# Patient Record
Sex: Male | Born: 1980
Health system: Southern US, Community
[De-identification: ages and names within clinical notes are randomized; demographics above are authoritative.]

## PROBLEM LIST (undated history)

## (undated) DIAGNOSIS — I309 Acute pericarditis, unspecified: Secondary | ICD-10-CM

## (undated) DIAGNOSIS — K5792 Diverticulitis of intestine, part unspecified, without perforation or abscess without bleeding: Secondary | ICD-10-CM

## (undated) DIAGNOSIS — F419 Anxiety disorder, unspecified: Secondary | ICD-10-CM

## (undated) DIAGNOSIS — I2510 Atherosclerotic heart disease of native coronary artery without angina pectoris: Secondary | ICD-10-CM

## (undated) DIAGNOSIS — I10 Essential (primary) hypertension: Secondary | ICD-10-CM

## (undated) DIAGNOSIS — Z8249 Family history of ischemic heart disease and other diseases of the circulatory system: Secondary | ICD-10-CM

## (undated) HISTORY — PX: WISDOM TOOTH EXTRACTION: SHX21

## (undated) HISTORY — PX: TONSILLECTOMY: SUR1361

---
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
## 2005-01-13 ENCOUNTER — Ambulatory Visit (HOSPITAL_COMMUNITY): Admission: RE | Admit: 2005-01-13 | Discharge: 2005-01-13 | Payer: Self-pay | Admitting: Family Medicine

## 2006-02-04 ENCOUNTER — Emergency Department (HOSPITAL_COMMUNITY): Admission: EM | Admit: 2006-02-04 | Discharge: 2006-02-05 | Payer: Self-pay | Admitting: Emergency Medicine

## 2006-02-07 ENCOUNTER — Ambulatory Visit (HOSPITAL_COMMUNITY): Admission: RE | Admit: 2006-02-07 | Discharge: 2006-02-07 | Payer: Self-pay | Admitting: Family Medicine

## 2011-10-12 ENCOUNTER — Emergency Department (HOSPITAL_COMMUNITY)
Admission: EM | Admit: 2011-10-12 | Discharge: 2011-10-12 | Disposition: A | Discharge status: Home / Self Care | Payer: 59 | Attending: Emergency Medicine | Admitting: Emergency Medicine

## 2011-10-12 ENCOUNTER — Encounter: Payer: Self-pay | Admitting: *Deleted

## 2011-10-12 DIAGNOSIS — Z23 Encounter for immunization: Secondary | ICD-10-CM | POA: Insufficient documentation

## 2011-10-12 DIAGNOSIS — Z203 Contact with and (suspected) exposure to rabies: Secondary | ICD-10-CM | POA: Insufficient documentation

## 2011-10-12 NOTE — ED Notes (Signed)
Dogs exposed to rabid skunk, cleaned area where skunk had been .  Told to come here for rabies injection

## 2011-10-12 NOTE — ED Provider Notes (Signed)
Medical screening examination/treatment/procedure(s) were conducted as a shared visit with non-physician practitioner(s) and myself.  I personally evaluated the patient during the encounter He cleaned the room where a rabid skunk.  Apparently, there was saliva everywhere. He did not have gloves on.  He has a single 2 mm erythematous area on the dorsum of his right hand.  He does not know how long it has been there.   He was sent here for rabies vaccines.  I do NOT think he needs vaccination.   He has no breaks in his skin.  Exposure is not confirmed.    Nicholes Stairs, MD 10/12/11 1616

## 2011-10-12 NOTE — ED Notes (Signed)
Pt d/c to home

## 2011-10-12 NOTE — ED Notes (Signed)
Pt was told to come to er by health dept for exposure to rabies in his role of clean up afterwards.

## 2011-10-13 NOTE — ED Provider Notes (Addendum)
History     CSN: [id number]  Arrival date & time 10/12/11  1338   First MD Initiated Contact with Patient 10/12/11 1458      Chief Complaint  Patient presents with  . Rabies Injection    (Consider location/radiation/quality/duration/timing/severity/associated sxs/prior treatment) HPI Comments: Patient woke 4 days ago to find a skunk in his fiance's dog lot chasing and attempting to attack her dogs.  The animal was killed and found to be positive for rabies today.  He took are of cleaning the dog lot with bleach spray followed by using a power sprayer.  He states there was saliva everywhere since the skunk was apparently salivating copiously.  He did not wear gloves while cleaning the lot,  But did not have any obvious blood to blood or saliva to blood contact.      History reviewed. No pertinent past medical history.  History reviewed. No pertinent past surgical history.  Family History  Problem Relation Age of Onset  . Diabetes Mother   . Heart failure Mother   . Heart failure Father     History  Substance Use Topics  . Smoking status: Current Everyday Smoker  . Smokeless tobacco: Not on file  . Alcohol Use: Yes      Review of Systems  Constitutional: Negative for fever.  HENT: Negative.   Eyes: Negative.   Respiratory: Negative.   Cardiovascular: Negative.   Gastrointestinal: Negative.   Genitourinary: Negative.   Musculoskeletal: Negative.  Negative for joint swelling.  Skin: Negative.  Negative for rash and wound.  Neurological: Negative for dizziness, weakness and headaches.  Hematological: Negative.   Psychiatric/Behavioral: Negative.     Allergies  Review of patient's allergies indicates no known allergies.  Home Medications   Current Outpatient Rx  Name Route Sig Dispense Refill  . ACETAMINOPHEN 325 MG PO TABS Oral Take 325-650 mg by mouth as needed. For pain       BP 151/86  Pulse 106  Temp(Src) 97.9 F (36.6 C) (Oral)  Resp 20  Ht 6' (1.829  m)  Wt 240 lb (108.863 kg)  BMI 32.55 kg/m2  SpO2 99%  Physical Exam  Nursing note and vitals reviewed. Constitutional: He is oriented to person, place, and time. He appears well-developed and well-nourished.  HENT:  Head: Normocephalic and atraumatic.  Eyes: Conjunctivae are normal.  Neck: Neck supple.  Cardiovascular: Normal rate.   Pulmonary/Chest: Effort normal.  Musculoskeletal: Normal range of motion.  Neurological: He is alert and oriented to person, place, and time.  Skin: Skin is warm and dry.       Small,  2 mm erythematous area on mid dorsal right hand.    Psychiatric: He has a normal mood and affect.    ED Course  Procedures (including critical care time)  Labs Reviewed - No data to display No results found.   1. Contact with and suspected exposure to rabies       MDM  Discussed risk factors for rabies including blood to blood or saliva to blood contact.  Patient also seen by Dr. Weldon Inches who agrees that contact was not one requiring rabies immunoglobulin or vaccine.     Candis Musa, PA 10/13/11 2236  Patient and other family members (fiance,  And future mother in law) were offered the vaccines by Dr. Weldon Inches,  If desired,  But not medically indicated.  Ms. Doren Custard deferred,  But stated she just needed this statement in writing that the shots are not needed  so she can show this to the [facility],  As they had called patients numerous times insisting they come in here to get treated.    Candis Musa, PA 10/14/11 [phone]

## 2011-10-14 NOTE — ED Provider Notes (Signed)
Medical screening examination/treatment/procedure(s) were performed by non-physician practitioner and as supervising physician I was immediately available for consultation/collaboration.  Feliciana Narayan P Jahniya Duzan, MD 10/14/11 0705 

## 2011-10-19 NOTE — ED Provider Notes (Signed)
Medical screening examination/treatment/procedure(s) were conducted as a shared visit with non-physician practitioner(s) and myself.  I personally evaluated the patient during the encounter  Nicholes Stairs, MD 10/19/11 [phone]

## 2014-03-27 ENCOUNTER — Emergency Department (HOSPITAL_COMMUNITY): Payer: 59

## 2014-03-27 ENCOUNTER — Emergency Department (HOSPITAL_COMMUNITY)
Admission: EM | Admit: 2014-03-27 | Discharge: 2014-03-27 | Disposition: A | Discharge status: Home / Self Care | Payer: 59 | Attending: Emergency Medicine | Admitting: Emergency Medicine

## 2014-03-27 ENCOUNTER — Encounter (HOSPITAL_COMMUNITY): Payer: Self-pay | Admitting: Emergency Medicine

## 2014-03-27 DIAGNOSIS — F17200 Nicotine dependence, unspecified, uncomplicated: Secondary | ICD-10-CM | POA: Insufficient documentation

## 2014-03-27 DIAGNOSIS — R1011 Right upper quadrant pain: Secondary | ICD-10-CM | POA: Insufficient documentation

## 2014-03-27 DIAGNOSIS — R109 Unspecified abdominal pain: Secondary | ICD-10-CM

## 2014-03-27 LAB — COMPREHENSIVE METABOLIC PANEL
ALT: 41 U/L (ref 0–53)
AST: 21 U/L (ref 0–37)
Albumin: 4 g/dL (ref 3.5–5.2)
Alkaline Phosphatase: 101 U/L (ref 39–117)
BUN: 10 mg/dL (ref 6–23)
CO2: 23 mEq/L (ref 19–32)
Calcium: 9.2 mg/dL (ref 8.4–10.5)
Chloride: 100 mEq/L (ref 96–112)
Creatinine, Ser: 0.84 mg/dL (ref 0.50–1.35)
GFR calc Af Amer: >90 mL/min (ref >90)
GFR calc non Af Amer: >90 mL/min (ref >90)
Glucose, Bld: 131 mg/dL — ABNORMAL HIGH (ref 70–99)
Potassium: 3.9 mEq/L (ref 3.7–5.3)
Sodium: 139 mEq/L (ref 137–147)
Total Bilirubin: 0.2 mg/dL — ABNORMAL LOW (ref 0.3–1.2)
Total Protein: 7.3 g/dL (ref 6.0–8.3)

## 2014-03-27 LAB — CBC WITH DIFFERENTIAL/PLATELET
Basophils Absolute: 0 10*3/uL (ref 0.0–0.1)
Basophils Relative: 0 % (ref 0–1)
Eosinophils Absolute: 0.2 10*3/uL (ref 0.0–0.7)
Eosinophils Relative: 2 % (ref 0–5)
HCT: 53.2 % — ABNORMAL HIGH (ref 39.0–52.0)
Hemoglobin: 18.5 g/dL — ABNORMAL HIGH (ref 13.0–17.0)
Lymphocytes Relative: 31 % (ref 12–46)
Lymphs Abs: 2.9 10*3/uL (ref 0.7–4.0)
MCH: 31.6 pg (ref 26.0–34.0)
MCHC: 34.8 g/dL (ref 30.0–36.0)
MCV: 90.8 fL (ref 78.0–100.0)
Monocytes Absolute: 0.6 10*3/uL (ref 0.1–1.0)
Monocytes Relative: 6 % (ref 3–12)
Neutro Abs: 5.5 10*3/uL (ref 1.7–7.7)
Neutrophils Relative %: 61 % (ref 43–77)
Platelets: 264 10*3/uL (ref 150–400)
RBC: 5.86 MIL/uL — ABNORMAL HIGH (ref 4.22–5.81)
RDW: 12.9 % (ref 11.5–15.5)
WBC: 9.1 10*3/uL (ref 4.0–10.5)

## 2014-03-27 LAB — URINALYSIS, ROUTINE W REFLEX MICROSCOPIC
Bilirubin Urine: NEGATIVE
Glucose, UA: NEGATIVE mg/dL
Ketones, ur: NEGATIVE mg/dL
Leukocytes, UA: NEGATIVE
Nitrite: NEGATIVE
Protein, ur: NEGATIVE mg/dL
Specific Gravity, Urine: >1.03 — ABNORMAL HIGH (ref 1.005–1.030)
Urobilinogen, UA: 0.2 mg/dL (ref 0.0–1.0)
pH: 5.5 (ref 5.0–8.0)

## 2014-03-27 LAB — URINE MICROSCOPIC-ADD ON

## 2014-03-27 LAB — LIPASE, BLOOD: Lipase: 23 U/L (ref 11–59)

## 2014-03-27 MED ORDER — TRAMADOL HCL 50 MG PO TABS: 50.0000 mg | ORAL_TABLET | Freq: Four times a day (QID) | ORAL | Status: DC | PRN

## 2014-03-27 NOTE — ED Notes (Signed)
Pt reports to the ED with complaint of "side pain" on right. Pt has had paid for 4 weeks with increased pain for 2 days. Pt states "I thought it was a pulled muscle but now it's gotten worse."

## 2014-03-27 NOTE — Discharge Instructions (Signed)
Flank Pain °Flank pain refers to pain that is located on the side of the body between the upper abdomen and the back. The pain may occur over a short period of time (acute) or may be long-term or reoccurring (chronic). It may be mild or severe. Flank pain can be caused by many things. °CAUSES  °Some of the more common causes of flank pain include: °· Muscle strains.   °· Muscle spasms.   °· A disease of your spine (vertebral disk disease).   °· A lung infection (pneumonia).   °· Fluid around your lungs (pulmonary edema).   °· A kidney infection.   °· Kidney stones.   °· A very painful skin rash caused by the chickenpox virus (shingles).   °· Gallbladder disease.   °HOME CARE INSTRUCTIONS  °Home care will depend on the cause of your pain. In general, °· Rest as directed by your caregiver. °· Drink enough fluids to keep your urine clear or pale yellow. °· Only take over-the-counter or prescription medicines as directed by your caregiver. Some medicines may help relieve the pain. °· Tell your caregiver about any changes in your pain. °· Follow up with your caregiver as directed. °SEEK IMMEDIATE MEDICAL CARE IF:  °· Your pain is not controlled with medicine.   °· You have new or worsening symptoms. °· Your pain increases.   °· You have abdominal pain.   °· You have shortness of breath.   °· You have persistent nausea or vomiting.   °· You have swelling in your abdomen.   °· You feel faint or pass out.   °· You have blood in your urine. °· You have a fever or persistent symptoms for more than 2-3 days. °· You have a fever and your symptoms suddenly get worse. °MAKE SURE YOU:  °· Understand these instructions. °· Will watch your condition. °· Will get help right away if you are not doing well or get worse. °Document Released: 11/11/2005 Document Revised: 06/14/2012 Document Reviewed: 05/04/2012 °[employer]® Patient Information ©2015 [employer]. This information is not intended to replace advice given to you by your  health care provider. Make sure you discuss any questions you have with your health care provider. ° °

## 2014-04-02 NOTE — ED Provider Notes (Signed)
CSN: [id number]     Arrival date & time 03/27/14  1552 History   First MD Initiated Contact with Patient 03/27/14 1606     Chief Complaint  Patient presents with  . Flank Pain     (Consider location/radiation/quality/duration/timing/severity/associated sxs/prior Treatment) HPI  33 year old male with right flank pain. Ongoing for the past month. Waxes and wanes but has become more constant over the past 2-3 days. Attempted he has increased as well. Initially thought that he might have pulled a muscle. Pain sometimes worse after eating, but not consistently. Denies or vomiting. No weight loss. No positional change. No urinary complaints. No fevers or chills. No history of similar type pain. No past abdominal surgical history.  History reviewed. No pertinent past medical history. Past Surgical History  Procedure Laterality Date  . Tonsillectomy     Family History  Problem Relation Age of Onset  . Diabetes Mother   . Heart failure Mother   . Heart failure Father    History  Substance Use Topics  . Smoking status: Current Every Day Smoker -- 0.50 packs/day  . Smokeless tobacco: Not on file  . Alcohol Use: Yes    Review of Systems  All systems reviewed and negative, other than as noted in HPI.   Allergies  Bee venom  Home Medications   Prior to Admission medications   Medication Sig Start Date End Date Taking? Authorizing Provider  acetaminophen (TYLENOL) 325 MG tablet Take 325-650 mg by mouth every 6 (six) hours as needed for moderate pain. For pain   Yes Historical Provider, MD  traMADol (ULTRAM) 50 MG tablet Take 1 tablet (50 mg total) by mouth every 6 (six) hours as needed. 03/27/14   Virgel Manifold, MD   BP 127/86  Pulse 88  Temp(Src) 98.4 F (36.9 C) (Oral)  Resp 18  Ht 6' (1.829 m)  Wt 238 lb (107.956 kg)  BMI 32.27 kg/m2  SpO2 98% Physical Exam  Nursing note and vitals reviewed. Constitutional: He appears well-developed and well-nourished. No distress.   HENT:  Head: Normocephalic and atraumatic.  Eyes: Conjunctivae are normal. Right eye exhibits no discharge. Left eye exhibits no discharge.  Neck: Neck supple.  Cardiovascular: Normal rate, regular rhythm and normal heart sounds.  Exam reveals no gallop and no friction rub.   No murmur heard. Pulmonary/Chest: Effort normal and breath sounds normal. No respiratory distress.  Abdominal: Soft. He exhibits no distension. There is tenderness.  Tenderness and right flank/right upper quadrant. No rebound or guarding. No distention. No overlying skin changes.  Genitourinary:  No CVA tenderness  Musculoskeletal: He exhibits no edema and no tenderness.  Lower extremities symmetric as compared to each other. No calf tenderness. Negative Homan's. No palpable cords.   Neurological: He is alert.  Skin: Skin is warm and dry. He is not diaphoretic.  Psychiatric: He has a normal mood and affect. His behavior is normal. Thought content normal.    ED Course  Procedures (including critical care time) Labs Review Labs Reviewed  URINALYSIS, ROUTINE W REFLEX MICROSCOPIC - Abnormal; Notable for the following:    Specific Gravity, Urine >1.030 (*)    Hgb urine dipstick TRACE (*)    All other components within normal limits  COMPREHENSIVE METABOLIC PANEL - Abnormal; Notable for the following:    Glucose, Bld 131 (*)    Total Bilirubin 0.2 (*)    All other components within normal limits  CBC WITH DIFFERENTIAL - Abnormal; Notable for the following:    RBC  5.86 (*)    Hemoglobin 18.5 (*)    HCT 53.2 (*)    All other components within normal limits  LIPASE, BLOOD  URINE MICROSCOPIC-ADD ON    Imaging Review No results found.  Dg Chest 2 View  03/27/2014   CLINICAL DATA:  flank pain  EXAM: CHEST  2 VIEW  COMPARISON:  Two-view chest 02/04/2006  FINDINGS: The heart size and mediastinal contours are within normal limits. Both lungs are clear. The visualized skeletal structures are unremarkable.   IMPRESSION: No active cardiopulmonary disease.   Electronically Signed   By: Margaree Mackintosh M.D.   On: 03/27/2014 18:50   US Abdomen Limited Ruq  03/27/2014   CLINICAL DATA:  Right upper quadrant abdominal pain.  EXAM: US ABDOMEN LIMITED - RIGHT UPPER QUADRANT  COMPARISON:  None.  FINDINGS: Gallbladder:  No gallstones or wall thickening visualized. No sonographic Murphy sign noted.  Common bile duct:  Diameter: 4.1 mm  Liver:  There is diffuse increased echogenicity of the liver and. decreased through transmission consistent with fatty infiltration. No focal lesions or biliary dilatation.  IMPRESSION: 1. Normal gallbladder and normal caliber common bile duct. 2. Diffuse fatty infiltration of the liver but no focal hepatic lesions or biliary dilatation.   Electronically Signed   By: Kalman Jewels M.D.   On: 03/27/2014 18:21    EKG Interpretation None      MDM   Final diagnoses:  Right flank pain    33 year old male with pain in his right flank/lower right lateral chest. Some tenderness of the right flank and right upper quadrant. Ultrasound negative for cholelithiasis. Chest x-ray is clear. No respiratory complaints. No urinary complaints. Urinalysis not consistent with infection. No hematuria to suggest renal/ureteral calculiTriage vitals with tachycardia. Normal heart rate on my examination though and throughout the rest of his emergency room stay. Unsure of the significance of this if any. Oxygen saturations normal on room air. No increased work of breathing. No clinical evidence of DVT. I doubt hissymptoms are secondary to pulmonary embolism.Marland Kitchen Atypical for ACS. Low suspicion for emergent process. Plan symptomatic treatment at this time. Outpatient followup otherwise.    Virgel Manifold, MD 04/02/14 [phone]

## 2014-06-20 ENCOUNTER — Other Ambulatory Visit: Payer: Self-pay | Admitting: Adult Health

## 2014-06-21 ENCOUNTER — Telehealth: Payer: Self-pay | Admitting: Adult Health

## 2014-06-21 LAB — ABO AND RH: RH TYPE: POSITIVE

## 2014-06-21 NOTE — Telephone Encounter (Signed)
Left message blood type A+

## 2015-02-19 ENCOUNTER — Emergency Department (HOSPITAL_COMMUNITY): Payer: Worker's Compensation

## 2015-02-19 ENCOUNTER — Emergency Department (HOSPITAL_COMMUNITY)
Admission: EM | Admit: 2015-02-19 | Discharge: 2015-02-19 | Disposition: A | Discharge status: Home / Self Care | Payer: Worker's Compensation | Attending: Emergency Medicine | Admitting: Emergency Medicine

## 2015-02-19 ENCOUNTER — Encounter (HOSPITAL_COMMUNITY): Payer: Self-pay | Admitting: *Deleted

## 2015-02-19 DIAGNOSIS — Y9389 Activity, other specified: Secondary | ICD-10-CM | POA: Insufficient documentation

## 2015-02-19 DIAGNOSIS — S53402A Unspecified sprain of left elbow, initial encounter: Secondary | ICD-10-CM | POA: Diagnosis not present

## 2015-02-19 DIAGNOSIS — S56912A Strain of unspecified muscles, fascia and tendons at forearm level, left arm, initial encounter: Secondary | ICD-10-CM | POA: Insufficient documentation

## 2015-02-19 DIAGNOSIS — Z720 Tobacco use: Secondary | ICD-10-CM | POA: Diagnosis not present

## 2015-02-19 DIAGNOSIS — W108XXA Fall (on) (from) other stairs and steps, initial encounter: Secondary | ICD-10-CM | POA: Insufficient documentation

## 2015-02-19 DIAGNOSIS — Y998 Other external cause status: Secondary | ICD-10-CM | POA: Insufficient documentation

## 2015-02-19 DIAGNOSIS — Y9289 Other specified places as the place of occurrence of the external cause: Secondary | ICD-10-CM | POA: Insufficient documentation

## 2015-02-19 DIAGNOSIS — S59902A Unspecified injury of left elbow, initial encounter: Secondary | ICD-10-CM | POA: Diagnosis present

## 2015-02-19 MED ORDER — OXYCODONE-ACETAMINOPHEN 5-325 MG PO TABS: 1.0000 | ORAL_TABLET | ORAL | Status: DC | PRN

## 2015-02-19 MED ORDER — NAPROXEN 500 MG PO TABS: 500.0000 mg | ORAL_TABLET | Freq: Two times a day (BID) | ORAL | Status: DC

## 2015-02-19 NOTE — ED Provider Notes (Signed)
CSN: [id number]     Arrival date & time 02/19/15  0213 History  This chart was scribed for Delora Fuel, MD by Chester Holstein, ED Scribe. This patient was seen in room D33C/D33C and the patient's care was started at 3:44 AM.    Chief Complaint  Patient presents with  . Fall    The history is provided by the patient. No language interpreter was used.   HPI Comments: Juan Lopez is a 34 y.o. male who presents to the Emergency Department complaining of fall around 8:15 PM. Pt states he was at work and lost his footing walking up steps. He fell down 4-5 steps and attempted to steady himself on rail with his left arm. He states he heard a pop from left elbow. Pt notes associated left elbow pain. He reports he does a lot of heavy lifting at work. Pt denies shoulder pain and any other injury.  History reviewed. No pertinent past medical history. Past Surgical History  Procedure Laterality Date  . Tonsillectomy     Family History  Problem Relation Age of Onset  . Diabetes Mother   . Heart failure Mother   . Heart failure Father    History  Substance Use Topics  . Smoking status: Current Every Day Smoker -- 0.50 packs/day  . Smokeless tobacco: Never Used  . Alcohol Use: Yes    Review of Systems  Musculoskeletal: Positive for myalgias and arthralgias.  Neurological: Negative for weakness and numbness.  All other systems reviewed and are negative.    Allergies  Bee venom  Home Medications   Prior to Admission medications   Medication Sig Start Date End Date Taking? Authorizing Provider  acetaminophen (TYLENOL) 325 MG tablet Take 325-650 mg by mouth every 6 (six) hours as needed for moderate pain. For pain    Historical Provider, MD  traMADol (ULTRAM) 50 MG tablet Take 1 tablet (50 mg total) by mouth every 6 (six) hours as needed. 03/27/14   Virgel Manifold, MD   BP 150/99 mmHg  Pulse 91  Temp(Src) 98 F (36.7 C) (Oral)  Resp 16  Ht 6' (1.829 m)  Wt 234 lb (106.142 kg)   BMI 31.73 kg/m2  SpO2 96% Physical Exam  Constitutional: He is oriented to person, place, and time. He appears well-developed and well-nourished.  HENT:  Head: Normocephalic.  Eyes: Conjunctivae are normal. Pupils are equal, round, and reactive to light.  Neck: Normal range of motion. Neck supple. No JVD present.  Cardiovascular: Normal rate, regular rhythm and normal heart sounds.   No murmur heard. Pulmonary/Chest: Effort normal and breath sounds normal. He has no wheezes. He has no rales. He exhibits no tenderness.  Abdominal: Soft. Bowel sounds are normal. He exhibits no distension and no mass. There is no tenderness.  Musculoskeletal: Normal range of motion. He exhibits no edema.  Tender over left biceps tendon and proximal forearm near the antecubital space Full passive ROM Pain on full passive extension of the left elbow Pain on flexion and extension of left elbow with no resistance No instability Distally NVI  Lymphadenopathy:    He has no cervical adenopathy.  Neurological: He is alert and oriented to person, place, and time. No cranial nerve deficit. He exhibits normal muscle tone. Coordination normal.  Skin: Skin is warm and dry. No rash noted.  Psychiatric: He has a normal mood and affect. His behavior is normal. Judgment and thought content normal.  Nursing note and vitals reviewed.   ED Course  Procedures (including critical care time) DIAGNOSTIC STUDIES: Oxygen Saturation is 96% on room air, normal by my interpretation.    COORDINATION OF CARE: 3:51 AM Discussed treatment plan with patient at beside including sling, Naprosyn, and Percocet, the patient agrees with the plan and has no further questions at this time.   Imaging Review Dg Elbow Complete Left  02/19/2015   CLINICAL DATA:  Status post fall down steps at work, with left elbow pain and swelling. Initial encounter.  EXAM: LEFT ELBOW - COMPLETE 3+ VIEW  COMPARISON:  None.  FINDINGS: There is no evidence of  fracture or dislocation. The visualized joint spaces are preserved. No significant joint effusion is identified. The soft tissues are unremarkable in appearance.  IMPRESSION: No evidence of fracture or dislocation.   Electronically Signed   By: Garald Balding M.D.   On: 02/19/2015 03:11    MDM   Final diagnoses:  Fall down steps, initial encounter  Strain of left elbow and forearm, initial encounter    Left elbow injury. There is good range of motion in the joint, no evidence of bony injury. X-ray is negative. Based on clinical exam, I suspect strain of biceps or possibly brachial radialis muscles. Patient is advised of his diagnosis and he is placed in a sling for comfort. Prescription is given for naproxen and oxycodone acetaminophen and is referred to hand surgery for follow-up.   I personally performed the services described in this documentation, which was scribed in my presence. The recorded information has been reviewed and is accurate.       Delora Fuel, MD 34/19/37 9024

## 2015-02-19 NOTE — Discharge Instructions (Signed)
Wear sling as needed.  Muscle Strain A muscle strain is an injury that occurs when a muscle is stretched beyond its normal length. Usually a small number of muscle fibers are torn when this happens. Muscle strain is rated in degrees. First-degree strains have the least amount of muscle fiber tearing and pain. Second-degree and third-degree strains have increasingly more tearing and pain.  Usually, recovery from muscle strain takes 1-2 weeks. Complete healing takes 5-6 weeks.  CAUSES  Muscle strain happens when a sudden, violent force placed on a muscle stretches it too far. This may occur with lifting, sports, or a fall.  RISK FACTORS Muscle strain is especially common in athletes.  SIGNS AND SYMPTOMS At the site of the muscle strain, there may be:  Pain.  Bruising.  Swelling.  Difficulty using the muscle due to pain or lack of normal function. DIAGNOSIS  Your health care provider will perform a physical exam and ask about your medical history. TREATMENT  Often, the best treatment for a muscle strain is resting, icing, and applying cold compresses to the injured area.  HOME CARE INSTRUCTIONS   Use the PRICE method of treatment to promote muscle healing during the first 2-3 days after your injury. The PRICE method involves:  Protecting the muscle from being injured again.  Restricting your activity and resting the injured body part.  Icing your injury. To do this, put ice in a plastic bag. Place a towel between your skin and the bag. Then, apply the ice and leave it on from 15-20 minutes each hour. After the third day, switch to moist heat packs.  Apply compression to the injured area with a splint or elastic bandage. Be careful not to wrap it too tightly. This may interfere with blood circulation or increase swelling.  Elevate the injured body part above the level of your heart as often as you can.  Only take over-the-counter or prescription medicines for pain, discomfort, or  fever as directed by your health care provider.  Warming up prior to exercise helps to prevent future muscle strains. SEEK MEDICAL CARE IF:   You have increasing pain or swelling in the injured area.  You have numbness, tingling, or a significant loss of strength in the injured area. MAKE SURE YOU:   Understand these instructions.  Will watch your condition.  Will get help right away if you are not doing well or get worse. Document Released: 09/20/2005 Document Revised: 07/11/2013 Document Reviewed: 04/19/2013 [employer] Patient Information 2015 [place], [place]. This information is not intended to replace advice given to you by your health care provider. Make sure you discuss any questions you have with your health care provider.  Naproxen and naproxen sodium oral immediate-release tablets What is this medicine? NAPROXEN (na PROX en) is a non-steroidal anti-inflammatory drug (NSAID). It is used to reduce swelling and to treat pain. This medicine may be used for dental pain, headache, or painful monthly periods. It is also used for painful joint and muscular problems such as arthritis, tendinitis, bursitis, and gout. This medicine may be used for other purposes; ask your health care provider or pharmacist if you have questions. COMMON BRAND NAME(S): Aflaxen, Aleve, Aleve Arthritis, All Day Relief, Anaprox, Anaprox DS, Naprosyn What should I tell my health care provider before I take this medicine? They need to know if you have any of these conditions: -asthma -cigarette smoker -drink more than 3 alcohol containing drinks a day -heart disease or circulation problems such as heart failure or  leg edema (fluid retention) -high blood pressure -kidney disease -liver disease -stomach bleeding or ulcers -an unusual or allergic reaction to naproxen, aspirin, other NSAIDs, other medicines, foods, dyes, or preservatives -pregnant or trying to get pregnant -breast-feeding How should I use this  medicine? Take this medicine by mouth with a glass of water. Follow the directions on the prescription label. Take it with food if your stomach gets upset. Try to not lie down for at least 10 minutes after you take it. Take your medicine at regular intervals. Do not take your medicine more often than directed. Long-term, continuous use may increase the risk of heart attack or stroke. A special MedGuide will be given to you by the pharmacist with each prescription and refill. Be sure to read this information carefully each time. Talk to your pediatrician regarding the use of this medicine in children. Special care may be needed. Overdosage: If you think you have taken too much of this medicine contact a poison control center or emergency room at once. NOTE: This medicine is only for you. Do not share this medicine with others. What if I miss a dose? If you miss a dose, take it as soon as you can. If it is almost time for your next dose, take only that dose. Do not take double or extra doses. What may interact with this medicine? -alcohol -aspirin -cidofovir -diuretics -lithium -methotrexate -other drugs for inflammation like ketorolac or prednisone -pemetrexed -probenecid -warfarin This list may not describe all possible interactions. Give your health care provider a list of all the medicines, herbs, non-prescription drugs, or dietary supplements you use. Also tell them if you smoke, drink alcohol, or use illegal drugs. Some items may interact with your medicine. What should I watch for while using this medicine? Tell your doctor or health care professional if your pain does not get better. Talk to your doctor before taking another medicine for pain. Do not treat yourself. This medicine does not prevent heart attack or stroke. In fact, this medicine may increase the chance of a heart attack or stroke. The chance may increase with longer use of this medicine and in people who have heart disease.  If you take aspirin to prevent heart attack or stroke, talk with your doctor or health care professional. Do not take other medicines that contain aspirin, ibuprofen, or naproxen with this medicine. Side effects such as stomach upset, nausea, or ulcers may be more likely to occur. Many medicines available without a prescription should not be taken with this medicine. This medicine can cause ulcers and bleeding in the stomach and intestines at any time during treatment. Do not smoke cigarettes or drink alcohol. These increase irritation to your stomach and can make it more susceptible to damage from this medicine. Ulcers and bleeding can happen without warning symptoms and can cause death. You may get drowsy or dizzy. Do not drive, use machinery, or do anything that needs mental alertness until you know how this medicine affects you. Do not stand or sit up quickly, especially if you are an older patient. This reduces the risk of dizzy or fainting spells. This medicine can cause you to bleed more easily. Try to avoid damage to your teeth and gums when you brush or floss your teeth. What side effects may I notice from receiving this medicine? Side effects that you should report to your doctor or health care professional as soon as possible: -black or bloody stools, blood in the urine or vomit -blurred  vision -chest pain -difficulty breathing or wheezing -nausea or vomiting -severe stomach pain -skin rash, skin redness, blistering or peeling skin, hives, or itching -slurred speech or weakness on one side of the body -swelling of eyelids, throat, lips -unexplained weight gain or swelling -unusually weak or tired -yellowing of eyes or skin Side effects that usually do not require medical attention (report to your doctor or health care professional if they continue or are bothersome): -constipation -headache -heartburn This list may not describe all possible side effects. Call your doctor for medical  advice about side effects. You may report side effects to FDA at 1-800-FDA-1088. Where should I keep my medicine? Keep out of the reach of children. Store at room temperature between 15 and 30 degrees C (59 and 86 degrees F). Keep container tightly closed. Throw away any unused medicine after the expiration date. NOTE: This sheet is a summary. It may not cover all possible information. If you have questions about this medicine, talk to your doctor, pharmacist, or health care provider.  2015, [employer] Standard. (2009-09-22 20:10:16)  Acetaminophen; Oxycodone tablets What is this medicine? ACETAMINOPHEN; OXYCODONE (a set a MEE noe fen; ox i KOE done) is a pain reliever. It is used to treat mild to moderate pain. This medicine may be used for other purposes; ask your health care provider or pharmacist if you have questions. COMMON BRAND NAME(S): Endocet, Magnacet, Narvox, Percocet, Perloxx, Primalev, Primlev, Roxicet, Xolox What should I tell my health care provider before I take this medicine? They need to know if you have any of these conditions: -brain tumor -Crohn's disease, inflammatory bowel disease, or ulcerative colitis -drug abuse or addiction -head injury -heart or circulation problems -if you often drink alcohol -kidney disease or problems going to the bathroom -liver disease -lung disease, asthma, or breathing problems -an unusual or allergic reaction to acetaminophen, oxycodone, other opioid analgesics, other medicines, foods, dyes, or preservatives -pregnant or trying to get pregnant -breast-feeding How should I use this medicine? Take this medicine by mouth with a full glass of water. Follow the directions on the prescription label. Take your medicine at regular intervals. Do not take your medicine more often than directed. Talk to your pediatrician regarding the use of this medicine in children. Special care may be needed. Patients over 71 years old may have a stronger  reaction and need a smaller dose. Overdosage: If you think you have taken too much of this medicine contact a poison control center or emergency room at once. NOTE: This medicine is only for you. Do not share this medicine with others. What if I miss a dose? If you miss a dose, take it as soon as you can. If it is almost time for your next dose, take only that dose. Do not take double or extra doses. What may interact with this medicine? -alcohol -antihistamines -barbiturates like amobarbital, butalbital, butabarbital, methohexital, pentobarbital, phenobarbital, thiopental, and secobarbital -benztropine -drugs for bladder problems like solifenacin, trospium, oxybutynin, tolterodine, hyoscyamine, and methscopolamine -drugs for breathing problems like ipratropium and tiotropium -drugs for certain stomach or intestine problems like propantheline, homatropine methylbromide, glycopyrrolate, atropine, belladonna, and dicyclomine -general anesthetics like etomidate, ketamine, nitrous oxide, propofol, desflurane, enflurane, halothane, isoflurane, and sevoflurane -medicines for depression, anxiety, or psychotic disturbances -medicines for sleep -muscle relaxants -naltrexone -narcotic medicines (opiates) for pain -phenothiazines like perphenazine, thioridazine, chlorpromazine, mesoridazine, fluphenazine, prochlorperazine, promazine, and trifluoperazine -scopolamine -tramadol -trihexyphenidyl This list may not describe all possible interactions. Give your health care provider a list of  all the medicines, herbs, non-prescription drugs, or dietary supplements you use. Also tell them if you smoke, drink alcohol, or use illegal drugs. Some items may interact with your medicine. What should I watch for while using this medicine? Tell your doctor or health care professional if your pain does not go away, if it gets worse, or if you have new or a different type of pain. You may develop tolerance to the  medicine. Tolerance means that you will need a higher dose of the medication for pain relief. Tolerance is normal and is expected if you take this medicine for a long time. Do not suddenly stop taking your medicine because you may develop a severe reaction. Your body becomes used to the medicine. This does NOT mean you are addicted. Addiction is a behavior related to getting and using a drug for a non-medical reason. If you have pain, you have a medical reason to take pain medicine. Your doctor will tell you how much medicine to take. If your doctor wants you to stop the medicine, the dose will be slowly lowered over time to avoid any side effects. You may get drowsy or dizzy. Do not drive, use machinery, or do anything that needs mental alertness until you know how this medicine affects you. Do not stand or sit up quickly, especially if you are an older patient. This reduces the risk of dizzy or fainting spells. Alcohol may interfere with the effect of this medicine. Avoid alcoholic drinks. There are different types of narcotic medicines (opiates) for pain. If you take more than one type at the same time, you may have more side effects. Give your health care provider a list of all medicines you use. Your doctor will tell you how much medicine to take. Do not take more medicine than directed. Call emergency for help if you have problems breathing. The medicine will cause constipation. Try to have a bowel movement at least every 2 to 3 days. If you do not have a bowel movement for 3 days, call your doctor or health care professional. Do not take Tylenol (acetaminophen) or medicines that have acetaminophen with this medicine. Too much acetaminophen can be very dangerous. Many nonprescription medicines contain acetaminophen. Always read the labels carefully to avoid taking more acetaminophen. What side effects may I notice from receiving this medicine? Side effects that you should report to your doctor or health  care professional as soon as possible: -allergic reactions like skin rash, itching or hives, swelling of the face, lips, or tongue -breathing difficulties, wheezing -confusion -light headedness or fainting spells -severe stomach pain -unusually weak or tired -yellowing of the skin or the whites of the eyes Side effects that usually do not require medical attention (report to your doctor or health care professional if they continue or are bothersome): -dizziness -drowsiness -nausea -vomiting This list may not describe all possible side effects. Call your doctor for medical advice about side effects. You may report side effects to FDA at 1-800-FDA-1088. Where should I keep my medicine? Keep out of the reach of children. This medicine can be abused. Keep your medicine in a safe place to protect it from theft. Do not share this medicine with anyone. Selling or giving away this medicine is dangerous and against the law. Store at room temperature between 20 and 25 degrees C (68 and 77 degrees F). Keep container tightly closed. Protect from light. This medicine may cause accidental overdose and death if it is taken by other adults,  children, or pets. Flush any unused medicine down the toilet to reduce the chance of harm. Do not use the medicine after the expiration date. NOTE: This sheet is a summary. It may not cover all possible information. If you have questions about this medicine, talk to your doctor, pharmacist, or health care provider.  2015, [employer] Standard. (2013-05-14 13:17:35)

## 2015-02-19 NOTE — ED Notes (Signed)
Pt. Slipped going down the stairs and grabbed the hand rail. Pt. Stopped fall but heard a pop in the left elbow. The left elbow is swollen with 7/10 pain. Pt. Feels numbness and tingling in left hand

## 2016-10-13 DIAGNOSIS — B354 Tinea corporis: Secondary | ICD-10-CM | POA: Diagnosis not present

## 2016-10-13 DIAGNOSIS — Z23 Encounter for immunization: Secondary | ICD-10-CM | POA: Diagnosis not present

## 2016-10-16 ENCOUNTER — Emergency Department (HOSPITAL_COMMUNITY): Payer: 59

## 2016-10-16 ENCOUNTER — Encounter (HOSPITAL_COMMUNITY)
Admission: EM | Disposition: A | Discharge status: Home / Self Care | Payer: Self-pay | Source: Home / Self Care | Attending: Cardiology

## 2016-10-16 ENCOUNTER — Encounter (HOSPITAL_COMMUNITY): Payer: Self-pay | Admitting: Emergency Medicine

## 2016-10-16 ENCOUNTER — Inpatient Hospital Stay (HOSPITAL_COMMUNITY): Payer: 59

## 2016-10-16 ENCOUNTER — Inpatient Hospital Stay (HOSPITAL_COMMUNITY)
Admission: EM | Admit: 2016-10-16 | Discharge: 2016-10-17 | DRG: 281 | Disposition: A | Discharge status: Home / Self Care | Payer: 59 | Attending: Cardiology | Admitting: Cardiology

## 2016-10-16 DIAGNOSIS — I319 Disease of pericardium, unspecified: Secondary | ICD-10-CM

## 2016-10-16 DIAGNOSIS — I2510 Atherosclerotic heart disease of native coronary artery without angina pectoris: Secondary | ICD-10-CM

## 2016-10-16 DIAGNOSIS — I10 Essential (primary) hypertension: Secondary | ICD-10-CM

## 2016-10-16 DIAGNOSIS — Z91030 Bee allergy status: Secondary | ICD-10-CM | POA: Diagnosis not present

## 2016-10-16 DIAGNOSIS — R079 Chest pain, unspecified: Secondary | ICD-10-CM

## 2016-10-16 DIAGNOSIS — I214 Non-ST elevation (NSTEMI) myocardial infarction: Principal | ICD-10-CM | POA: Diagnosis present

## 2016-10-16 DIAGNOSIS — I071 Rheumatic tricuspid insufficiency: Secondary | ICD-10-CM | POA: Diagnosis present

## 2016-10-16 DIAGNOSIS — I309 Acute pericarditis, unspecified: Secondary | ICD-10-CM | POA: Diagnosis not present

## 2016-10-16 DIAGNOSIS — R21 Rash and other nonspecific skin eruption: Secondary | ICD-10-CM | POA: Diagnosis present

## 2016-10-16 DIAGNOSIS — Z79899 Other long term (current) drug therapy: Secondary | ICD-10-CM

## 2016-10-16 DIAGNOSIS — Z87891 Personal history of nicotine dependence: Secondary | ICD-10-CM | POA: Diagnosis not present

## 2016-10-16 DIAGNOSIS — I409 Acute myocarditis, unspecified: Secondary | ICD-10-CM | POA: Diagnosis not present

## 2016-10-16 DIAGNOSIS — Z8249 Family history of ischemic heart disease and other diseases of the circulatory system: Secondary | ICD-10-CM

## 2016-10-16 DIAGNOSIS — R0789 Other chest pain: Secondary | ICD-10-CM | POA: Diagnosis present

## 2016-10-16 DIAGNOSIS — Z833 Family history of diabetes mellitus: Secondary | ICD-10-CM | POA: Diagnosis not present

## 2016-10-16 HISTORY — DX: Essential (primary) hypertension: I10

## 2016-10-16 HISTORY — DX: Atherosclerotic heart disease of native coronary artery without angina pectoris: I25.10

## 2016-10-16 HISTORY — DX: Family history of ischemic heart disease and other diseases of the circulatory system: Z82.49

## 2016-10-16 HISTORY — DX: Acute pericarditis, unspecified: I30.9

## 2016-10-16 HISTORY — PX: CARDIAC CATHETERIZATION: SHX172

## 2016-10-16 LAB — BASIC METABOLIC PANEL
Anion gap: 8 (ref 5–15)
BUN: 13 mg/dL (ref 6–20)
CALCIUM: 8.8 mg/dL — AB (ref 8.9–10.3)
CO2: 27 mmol/L (ref 22–32)
CREATININE: 0.89 mg/dL (ref 0.61–1.24)
Chloride: 100 mmol/L — ABNORMAL LOW (ref 101–111)
GFR calc Af Amer: >60 mL/min (ref >60)
GLUCOSE: 110 mg/dL — AB (ref 65–99)
Potassium: 3.7 mmol/L (ref 3.5–5.1)
Sodium: 135 mmol/L (ref 135–145)

## 2016-10-16 LAB — I-STAT TROPONIN, ED
TROPONIN I, POC: 2.4 ng/mL — AB (ref 0.00–0.08)
Troponin i, poc: 2.3 ng/mL (ref 0.00–0.08)

## 2016-10-16 LAB — CREATININE, SERUM
Creatinine, Ser: 0.85 mg/dL (ref 0.61–1.24)
GFR calc Af Amer: >60 mL/min (ref >60)
GFR calc non Af Amer: >60 mL/min (ref >60)

## 2016-10-16 LAB — ECHOCARDIOGRAM COMPLETE
Height: 72 in
Weight: 4528 oz

## 2016-10-16 LAB — URINALYSIS, ROUTINE W REFLEX MICROSCOPIC
BILIRUBIN URINE: NEGATIVE
Bacteria, UA: NONE SEEN
GLUCOSE, UA: NEGATIVE mg/dL
Ketones, ur: NEGATIVE mg/dL
LEUKOCYTES UA: NEGATIVE
Nitrite: NEGATIVE
PROTEIN: NEGATIVE mg/dL
RBC / HPF: NONE SEEN RBC/hpf (ref 0–5)
Specific Gravity, Urine: 1.003 — ABNORMAL LOW (ref 1.005–1.030)
WBC, UA: NONE SEEN WBC/hpf (ref 0–5)
pH: 6 (ref 5.0–8.0)

## 2016-10-16 LAB — CBC
HCT: 49.3 % (ref 39.0–52.0)
HCT: 47.9 % (ref 39.0–52.0)
HEMOGLOBIN: 16.8 g/dL (ref 13.0–17.0)
Hemoglobin: 16.7 g/dL (ref 13.0–17.0)
MCH: 30.8 pg (ref 26.0–34.0)
MCH: 31.3 pg (ref 26.0–34.0)
MCHC: 34.1 g/dL (ref 30.0–36.0)
MCHC: 34.9 g/dL (ref 30.0–36.0)
MCV: 90.5 fL (ref 78.0–100.0)
MCV: 89.9 fL (ref 78.0–100.0)
PLATELETS: 187 10*3/uL (ref 150–400)
Platelets: 201 K/uL (ref 150–400)
RBC: 5.45 MIL/uL (ref 4.22–5.81)
RBC: 5.33 MIL/uL (ref 4.22–5.81)
RDW: 12.9 % (ref 11.5–15.5)
RDW: 12.9 % (ref 11.5–15.5)
WBC: 4.5 10*3/uL (ref 4.0–10.5)
WBC: 6.5 K/uL (ref 4.0–10.5)

## 2016-10-16 LAB — RAPID URINE DRUG SCREEN, HOSP PERFORMED
Amphetamines: NOT DETECTED
BARBITURATES: NOT DETECTED
Benzodiazepines: NOT DETECTED
COCAINE: NOT DETECTED
Opiates: NOT DETECTED
TETRAHYDROCANNABINOL: NOT DETECTED

## 2016-10-16 LAB — TROPONIN I: Troponin I: 1.59 ng/mL (ref <0.03)

## 2016-10-16 LAB — MRSA PCR SCREENING: MRSA by PCR: NEGATIVE

## 2016-10-16 LAB — PROTIME-INR
INR: 0.94
PROTHROMBIN TIME: 12.6 s (ref 11.4–15.2)

## 2016-10-16 SURGERY — LEFT HEART CATH AND CORONARY ANGIOGRAPHY
Anesthesia: LOCAL

## 2016-10-16 MED ORDER — SODIUM CHLORIDE 0.9% FLUSH
3.0000 mL | Freq: Two times a day (BID) | INTRAVENOUS | Status: DC
  Administered 2016-10-16 – 2016-10-17 (×2): 3 mL via INTRAVENOUS

## 2016-10-16 MED ORDER — ACETAMINOPHEN 325 MG PO TABS: 650.0000 mg | ORAL_TABLET | ORAL | Status: DC | PRN

## 2016-10-16 MED ORDER — LIDOCAINE HCL (PF) 1 % IJ SOLN
INTRAMUSCULAR | Status: AC
  Filled 2016-10-16: qty 30

## 2016-10-16 MED ORDER — ASPIRIN 81 MG PO CHEW
324.0000 mg | CHEWABLE_TABLET | Freq: Once | ORAL | Status: AC
  Administered 2016-10-16: 324 mg via ORAL
  Filled 2016-10-16: qty 4

## 2016-10-16 MED ORDER — FENTANYL CITRATE (PF) 100 MCG/2ML IJ SOLN
INTRAMUSCULAR | Status: DC | PRN
  Administered 2016-10-16 (×2): 25 ug via INTRAVENOUS

## 2016-10-16 MED ORDER — MIDAZOLAM HCL 2 MG/2ML IJ SOLN
INTRAMUSCULAR | Status: DC | PRN
  Administered 2016-10-16 (×2): 1 mg via INTRAVENOUS

## 2016-10-16 MED ORDER — VERAPAMIL HCL 2.5 MG/ML IV SOLN
INTRAVENOUS | Status: DC | PRN
  Administered 2016-10-16: 10 mL via INTRA_ARTERIAL

## 2016-10-16 MED ORDER — SODIUM CHLORIDE 0.9% FLUSH: 3.0000 mL | INTRAVENOUS | Status: DC | PRN

## 2016-10-16 MED ORDER — IOPAMIDOL (ISOVUE-370) INJECTION 76%
INTRAVENOUS | Status: AC
  Filled 2016-10-16: qty 125

## 2016-10-16 MED ORDER — SODIUM CHLORIDE 0.9 % IV SOLN: 250.0000 mL | INTRAVENOUS | Status: DC | PRN

## 2016-10-16 MED ORDER — NITROGLYCERIN 0.4 MG SL SUBL
0.4000 mg | SUBLINGUAL_TABLET | SUBLINGUAL | Status: DC | PRN
  Administered 2016-10-16 (×3): 0.4 mg via SUBLINGUAL
  Filled 2016-10-16 (×2): qty 1

## 2016-10-16 MED ORDER — IOPAMIDOL (ISOVUE-370) INJECTION 76%
INTRAVENOUS | Status: DC | PRN
  Administered 2016-10-16: 100 mL via INTRA_ARTERIAL

## 2016-10-16 MED ORDER — FENTANYL CITRATE (PF) 100 MCG/2ML IJ SOLN
INTRAMUSCULAR | Status: AC
  Filled 2016-10-16: qty 2

## 2016-10-16 MED ORDER — HEPARIN (PORCINE) IN NACL 2-0.9 UNIT/ML-% IJ SOLN
INTRAMUSCULAR | Status: DC | PRN
  Administered 2016-10-16: 1000 mL

## 2016-10-16 MED ORDER — COLCHICINE 0.6 MG PO TABS
0.6000 mg | ORAL_TABLET | Freq: Two times a day (BID) | ORAL | Status: DC
  Administered 2016-10-16 – 2016-10-17 (×2): 0.6 mg via ORAL
  Filled 2016-10-16 (×2): qty 1

## 2016-10-16 MED ORDER — MIDAZOLAM HCL 2 MG/2ML IJ SOLN
INTRAMUSCULAR | Status: AC
  Filled 2016-10-16: qty 2

## 2016-10-16 MED ORDER — LIDOCAINE HCL (PF) 1 % IJ SOLN
INTRAMUSCULAR | Status: DC | PRN
  Administered 2016-10-16: 3 mL via INTRADERMAL

## 2016-10-16 MED ORDER — SODIUM CHLORIDE 0.9 % IV BOLUS (SEPSIS)
500.0000 mL | Freq: Once | INTRAVENOUS | Status: AC
  Administered 2016-10-16: 500 mL via INTRAVENOUS

## 2016-10-16 MED ORDER — ONDANSETRON HCL 4 MG/2ML IJ SOLN: 4.0000 mg | Freq: Four times a day (QID) | INTRAMUSCULAR | Status: DC | PRN

## 2016-10-16 MED ORDER — NITROGLYCERIN 1 MG/10 ML FOR IR/CATH LAB
INTRA_ARTERIAL | Status: AC
  Filled 2016-10-16: qty 10

## 2016-10-16 MED ORDER — HEPARIN (PORCINE) IN NACL 2-0.9 UNIT/ML-% IJ SOLN
INTRAMUSCULAR | Status: AC
  Filled 2016-10-16: qty 1000

## 2016-10-16 MED ORDER — ENOXAPARIN SODIUM 40 MG/0.4ML ~~LOC~~ SOLN
40.0000 mg | SUBCUTANEOUS | Status: DC
  Administered 2016-10-17: 40 mg via SUBCUTANEOUS
  Filled 2016-10-16: qty 0.4

## 2016-10-16 MED ORDER — NITROGLYCERIN IN D5W 200-5 MCG/ML-% IV SOLN
INTRAVENOUS | Status: DC
Start: 2016-10-16 — End: 2016-10-16
  Filled 2016-10-16: qty 250

## 2016-10-16 MED ORDER — HEPARIN SODIUM (PORCINE) 1000 UNIT/ML IJ SOLN
INTRAMUSCULAR | Status: DC | PRN
  Administered 2016-10-16: 5000 [IU] via INTRAVENOUS

## 2016-10-16 MED ORDER — CARVEDILOL 3.125 MG PO TABS
3.1250 mg | ORAL_TABLET | Freq: Two times a day (BID) | ORAL | Status: DC
  Administered 2016-10-16 – 2016-10-17 (×2): 3.125 mg via ORAL
  Filled 2016-10-16 (×2): qty 1

## 2016-10-16 MED ORDER — HEPARIN SODIUM (PORCINE) 1000 UNIT/ML IJ SOLN
INTRAMUSCULAR | Status: AC
  Filled 2016-10-16: qty 1

## 2016-10-16 MED ORDER — IBUPROFEN 200 MG PO TABS
400.0000 mg | ORAL_TABLET | Freq: Three times a day (TID) | ORAL | Status: DC
  Administered 2016-10-16 – 2016-10-17 (×3): 400 mg via ORAL
  Filled 2016-10-16 (×3): qty 2

## 2016-10-16 MED ORDER — SODIUM CHLORIDE 0.9 % WEIGHT BASED INFUSION
1.0000 mL/kg/h | INTRAVENOUS | Status: AC
  Administered 2016-10-16: 1 mL/kg/h via INTRAVENOUS

## 2016-10-16 MED ORDER — HEPARIN (PORCINE) IN NACL 100-0.45 UNIT/ML-% IJ SOLN
1400.0000 [IU]/h | INTRAMUSCULAR | Status: DC
  Administered 2016-10-16: 1400 [IU]/h via INTRAVENOUS

## 2016-10-16 MED ORDER — VERAPAMIL HCL 2.5 MG/ML IV SOLN
INTRAVENOUS | Status: AC
  Filled 2016-10-16: qty 2

## 2016-10-16 MED ORDER — ASPIRIN 81 MG PO CHEW: 81.0000 mg | CHEWABLE_TABLET | ORAL | Status: DC

## 2016-10-16 MED ORDER — HEPARIN SODIUM (PORCINE) 5000 UNIT/ML IJ SOLN
4000.0000 [IU] | Freq: Once | INTRAMUSCULAR | Status: AC
  Administered 2016-10-16: 4000 [IU] via INTRAVENOUS

## 2016-10-16 MED ORDER — SODIUM CHLORIDE 0.9 % IV SOLN: INTRAVENOUS | Status: DC

## 2016-10-16 MED ORDER — HEPARIN (PORCINE) IN NACL 100-0.45 UNIT/ML-% IJ SOLN
14.0000 [IU]/kg/h | Freq: Once | INTRAMUSCULAR | Status: AC
  Administered 2016-10-16: 14 [IU]/kg/h via INTRAVENOUS

## 2016-10-16 MED ORDER — HEPARIN (PORCINE) IN NACL 100-0.45 UNIT/ML-% IJ SOLN
INTRAMUSCULAR | Status: AC
  Administered 2016-10-16: 14 [IU]/kg/h via INTRAVENOUS
  Filled 2016-10-16: qty 250

## 2016-10-16 MED ORDER — SODIUM CHLORIDE 0.9 % IV SOLN
INTRAVENOUS | Status: DC
Start: 2016-10-16 — End: 2016-10-16
  Administered 2016-10-16: 999 mL via INTRAVENOUS
  Administered 2016-10-16: 16:00:00 via INTRAVENOUS

## 2016-10-16 MED ORDER — ATORVASTATIN CALCIUM 40 MG PO TABS
40.0000 mg | ORAL_TABLET | Freq: Every day | ORAL | Status: DC
  Administered 2016-10-16: 40 mg via ORAL
  Filled 2016-10-16: qty 1

## 2016-10-16 SURGICAL SUPPLY — 10 items
CATH INFINITI 5FR MULTPACK ANG (CATHETERS) ×1 IMPLANT
DEVICE RAD COMP TR BAND LRG (VASCULAR PRODUCTS) ×1 IMPLANT
GLIDESHEATH SLEND SS 6F .021 (SHEATH) ×1 IMPLANT
GUIDEWIRE INQWIRE 1.5J.035X260 (WIRE) IMPLANT
INQWIRE 1.5J .035X260CM (WIRE) ×2
KIT HEART LEFT (KITS) ×2 IMPLANT
PACK CARDIAC CATHETERIZATION (CUSTOM PROCEDURE TRAY) ×2 IMPLANT
SYR MEDRAD MARK V 150ML (SYRINGE) ×2 IMPLANT
TRANSDUCER W/STOPCOCK (MISCELLANEOUS) ×2 IMPLANT
TUBING CIL FLEX 10 FLL-RA (TUBING) ×2 IMPLANT

## 2016-10-16 NOTE — ED Notes (Signed)
CRITICAL VALUE ALERT  Critical value received:  I-Stat troponin 2.30  Date of notification:  10/16/16  Time of notification:  1250  Critical value read back:yes  Nurse who received alert:  Tilden Fossa RN  MD notified (1st page):  Rogene Houston  Time of first page:  1250  MD notified (2nd page):  Time of second page:  Responding MD:  Rogene Houston   Time MD responded:  1250

## 2016-10-16 NOTE — Progress Notes (Signed)
Responded to page for urgent Cath lab, not STEMI. Provided pre-op emotional/spiritual support and prayer to pt and 3 family members w/ him in rm. Nursing staff joined w/ [place] in prayer. Chaplain available for f/u.   10/16/16 1700  Clinical Encounter Type  Visited With Patient and family together  Visit Type Initial;Psychological support;Spiritual support;Social support;Pre-op  Referral From Nurse  Spiritual Encounters  Spiritual Needs Prayer;Emotional  Stress Factors  Patient Stress Factors Health changes;Loss of control  Family Stress Factors Family relationships;Health changes;Loss of control   Gerrit Heck, Chaplain

## 2016-10-16 NOTE — ED Notes (Signed)
Cardiology paged at this time to report critical I-Stat Troponin.

## 2016-10-16 NOTE — ED Provider Notes (Signed)
[facility] DEPT Provider Note   CSN: [id number] Arrival date & time: 10/16/16  0846  By signing my name below, I, Jeanell Sparrow, attest that this documentation has been prepared under the direction and in the presence of Fredia Sorrow, MD . Electronically Signed: Jeanell Sparrow, Scribe. 10/16/2016. 9:59 AM.  History   Chief Complaint Chief Complaint  Patient presents with  . Chest Pain   The history is provided by the patient. No language interpreter was used.  Chest Pain   This is a new problem. The current episode started 6 to 12 hours ago. The problem occurs constantly. The problem has been gradually improving. The pain is present in the substernal region. The pain is moderate. The pain radiates to the left shoulder and right shoulder. Duration of episode(s) is 7 hours. Associated symptoms include cough (Substernal), diaphoresis, a fever, headaches, nausea and vomiting. Pertinent negatives include no abdominal pain, no back pain and no shortness of breath.  His family medical history is significant for early MI.   HPI Comments: Juan Lopez is a 36 y.o. male who presents to the Emergency Department complaining of constant moderate substernal chest pain that started about 7 hours ago. He states he had an adverse reaction to a tetanus vaccine earlier in the week. He had a sudden onset of pain upon waking up today. He describes the pain as radiating to his bilateral shoulders. He rates his pain as a 9/10 at its worst. His pain improved upon ED arrival. He reports associated symptoms of diaphoresis, nausea, and vomiting (2 episodes). He has multiple family members with a hx of MI before age 24. He denies any cocaine use or recent hx of smoking/drinking.     PCP: [name], MD  History reviewed. No pertinent past medical history.  Patient Active Problem List   Diagnosis Date Noted  . Non-STEMI (non-ST elevated myocardial infarction) ([place]) 10/16/2016    Past Surgical  History:  Procedure Laterality Date  . TONSILLECTOMY         Home Medications    Prior to Admission medications   Medication Sig Start Date End Date Taking? Authorizing Provider  acetaminophen (TYLENOL) 325 MG tablet Take 325-650 mg by mouth every 6 (six) hours as needed for moderate pain. For pain   Yes Historical Provider, MD  ibuprofen (ADVIL,MOTRIN) 200 MG tablet Take 600 mg by mouth every 6 (six) hours as needed for headache.   Yes Historical Provider, MD  ketoconazole (NIZORAL) 2 % cream Apply 1 application topically 2 (two) times daily. 10/14/16  Yes Historical Provider, MD    Family History Family History  Problem Relation Age of Onset  . Diabetes Mother   . Heart failure Mother   . Heart failure Father     Social History Social History  Substance Use Topics  . Smoking status: Current Some Day Smoker    Packs/day: 0.50  . Smokeless tobacco: Never Used  . Alcohol use Yes     Allergies   Bee venom   Review of Systems Review of Systems  Constitutional: Positive for chills, diaphoresis and fever.  HENT: Positive for congestion, rhinorrhea and sore throat.   Eyes: Positive for visual disturbance.  Respiratory: Positive for cough (Substernal). Negative for shortness of breath.   Cardiovascular: Positive for chest pain (Substernal) and leg swelling.  Gastrointestinal: Positive for nausea and vomiting. Negative for abdominal pain and diarrhea.  Genitourinary: Negative for dysuria and hematuria.  Musculoskeletal: Negative for back pain.  Skin: Positive for rash (  LE).  Neurological: Positive for headaches.  Hematological: Does not bruise/bleed easily.  Psychiatric/Behavioral: Negative for confusion.     Physical Exam Updated Vital Signs BP 130/81 (BP Location: Right Arm)   Pulse 81   Temp 98.1 F (36.7 C) (Oral)   Resp 19   Ht 6' (1.829 m)   Wt 283 lb (128.4 kg)   SpO2 97%   BMI 38.38 kg/m   Physical Exam  Constitutional: He appears well-developed  and well-nourished. No distress.  HENT:  Head: Normocephalic and atraumatic.  Mouth/Throat: Mucous membranes are not dry.  Eyes: Conjunctivae and EOM are normal. Pupils are equal, round, and reactive to light. No scleral icterus.  Neck: Neck supple.  Cardiovascular: Normal rate and regular rhythm.   Pulmonary/Chest: Effort normal. No respiratory distress. He has no wheezes. He has no rales.  Abdominal: Soft. Bowel sounds are normal. There is no tenderness.  Musculoskeletal: Normal range of motion. He exhibits no edema.  Neurological: He is alert.  Skin: Skin is warm and dry.  Psychiatric: He has a normal mood and affect.  Nursing note and vitals reviewed.    ED Treatments / Results  DIAGNOSTIC STUDIES: Oxygen Saturation is 97% on RA, normal by my interpretation.    COORDINATION OF CARE: 10:03 AM- Pt advised of plan for treatment and pt agrees.  Labs (all labs ordered are listed, but only abnormal results are displayed) Labs Reviewed  BASIC METABOLIC PANEL - Abnormal; Notable for the following:       Result Value   Chloride 100 (*)    Glucose, Bld 110 (*)    Calcium 8.8 (*)    All other components within normal limits  URINALYSIS, ROUTINE W REFLEX MICROSCOPIC - Abnormal; Notable for the following:    Color, Urine STRAW (*)    Specific Gravity, Urine 1.003 (*)    Hgb urine dipstick SMALL (*)    All other components within normal limits  I-STAT TROPOININ, ED - Abnormal; Notable for the following:    Troponin i, poc 2.40 (*)    All other components within normal limits  CBC  RAPID URINE DRUG SCREEN, HOSP PERFORMED  PROTIME-INR  HEPARIN LEVEL (UNFRACTIONATED)  I-STAT TROPOININ, ED    EKG  EKG Interpretation  Date/Time:  Saturday October 16 2016 10:09:20 EST Ventricular Rate:  70 PR Interval:    QRS Duration: 95 QT Interval:  361 QTC Calculation: 390 R Axis:   94 Text Interpretation:  Sinus rhythm Borderline right axis deviation ST elevation suggests acute  pericarditis Agree could represent pericarditis Confirmed by Rogene Houston  MD, Alynah Schone [id number]) on 10/16/2016 10:48:38 AM       Radiology Dg Chest 2 View  Result Date: 10/16/2016 CLINICAL DATA:  Chest pain EXAM: CHEST  2 VIEW COMPARISON:  March 27, 2014 FINDINGS: The heart size and mediastinal contours are within normal limits. Both lungs are clear. The visualized skeletal structures are unremarkable. IMPRESSION: No active cardiopulmonary disease. Electronically Signed   By: Dorise Bullion III M.D   On: 10/16/2016 09:45    Procedures Procedures (including critical care time)  CRITICAL CARE Performed by: Fredia Sorrow Total critical care time:  45 minutes Critical care time was exclusive of separately billable procedures and treating other patients. Critical care was necessary to treat or prevent imminent or life-threatening deterioration. Critical care was time spent personally by me on the following activities: development of treatment plan with patient and/or surrogate as well as nursing, discussions with consultants, evaluation of patient's response to treatment, examination  of patient, obtaining history from patient or surrogate, ordering and performing treatments and interventions, ordering and review of laboratory studies, ordering and review of radiographic studies, pulse oximetry and re-evaluation of patient's condition.  Medications Ordered in ED Medications  nitroGLYCERIN (NITROSTAT) SL tablet 0.4 mg (0.4 mg Sublingual Given 10/16/16 1032)  0.9 %  sodium chloride infusion (999 mLs Intravenous New Bag/Given 10/16/16 1022)  heparin ADULT infusion 100 units/mL (25000 units/254mL sodium chloride 0.45%) (1,400 Units/hr Intravenous New Bag/Given 10/16/16 1045)  aspirin chewable tablet 324 mg (324 mg Oral Given 10/16/16 1016)  sodium chloride 0.9 % bolus 500 mL (0 mLs Intravenous Stopped 10/16/16 1152)  heparin injection 4,000 Units (4,000 Units Intravenous Given 10/16/16 1015)  heparin ADULT  infusion 100 units/mL (25000 units/254mL sodium chloride 0.45%) (0 Units/kg/hr  128.4 kg Intravenous Stopped 10/16/16 1043)     Initial Impression / Assessment and Plan / ED Course  I have reviewed the triage vital signs and the nursing notes.  Pertinent labs & imaging results that were available during my care of the patient were reviewed by me and considered in my medical decision making (see chart for details).  Clinical Course    Patient with acute onset of substernal chest pain at 3 in the morning. EKG with some subtle ST changes more consistent with early repull. Repeat EKG was subtle ST changes inferiorly but nothing consistent with STEMI. Patient's initial troponin was elevated at 2.4. Patient upon arrival chest pain is gone from 8 down to just a discomfort. With aspirin and sublingual nitroglycerin and starting heparin all discomfort resolved.  Repeat troponin is pending.  Discussed with cardiology at [facility] Dr. Meda Coffee they have accepted patient however there is no telemetry or step down or ICU beds available. Patient will remain here in the meantime. Temporary admitting orders for step down bed completed. Along with transfer documents.  Patient remains chest pain-free.   In addition patient did have a reaction to a tetanus shot earlier this week had fever and chills also developed a rash. Possible based on the second EKG that symptoms could be consistent with a pericarditis.  Cardiology agreed that patient was not a STEMI.   Final Clinical Impressions(s) / ED Diagnoses   Final diagnoses:  NSTEMI (non-ST elevated myocardial infarction) ([place])  Pericarditis, unspecified chronicity, unspecified type    New Prescriptions New Prescriptions   No medications on file   I personally performed the services described in this documentation, which was scribed in my presence. The recorded information has been reviewed and is accurate.       Fredia Sorrow, MD 10/16/16 1234

## 2016-10-16 NOTE — Progress Notes (Signed)
  Echocardiogram 2D Echocardiogram has been performed.  Jennette Dubin 10/16/2016, 4:24 PM

## 2016-10-16 NOTE — Progress Notes (Signed)
ANTICOAGULATION CONSULT NOTE - Initial Consult  Pharmacy Consult for heparin Indication: chest pain/ACS and STEMI  Allergies  Allergen Reactions  . Bee Venom Nausea And Vomiting    Patient Measurements: Height: 6' (182.9 cm) Weight: 283 lb (128.4 kg) IBW/kg (Calculated) : 77.6 HEPARIN DW (KG): 106.4  Vital Signs: Temp: 98.1 F (36.7 C) (01/13 0948) Temp Source: Oral (01/13 0948) BP: 130/81 (01/13 0948) Pulse Rate: 81 (01/13 0948)  Labs:  Recent Labs  10/16/16 0908  HGB 16.8  HCT 49.3  PLT 187  CREATININE 0.89    Estimated Creatinine Clearance: 163.5 mL/min (by C-G formula based on SCr of 0.89 mg/dL).   Medical History: History reviewed. No pertinent past medical history.  Medications:  See med rec  Assessment: 36 y.o. male who presents to the Emergency Department complaining of constant moderate substernal chest pain that started about 7 hours ago. Positive for chills, diaphoresis and fever.  Also, has congestion, rhinorrhea and sore throat. No SOB, but cough. Elevated troponin 2.4. Start anticoagulation with heparin.  Goal of Therapy:  Heparin level 0.3-0.7 units/ml Monitor platelets by anticoagulation protocol: Yes   Plan:  Give 4000 units bolus x 1 Start heparin infusion at 1400 units/hr Check anti-Xa level in 6 hours and daily while on heparin Continue to monitor H&H and platelets   Isac Sarna, BS Vena Austria, BCPS Clinical Pharmacist Pager [phone]   Trenton Gammon, Warner Laduca L 10/16/2016,10:19 AM

## 2016-10-16 NOTE — H&P (Addendum)
Patient ID: Juan Lopez MRN: [id number], DOB/AGE: 04-09-1981   Admit date: 10/16/2016   Primary Physician: [name], MD Primary Cardiologist: None, new patient  Pt. Profile:  Chest pain, elevated troponin  Problem List  History reviewed. No pertinent past medical history.  Past Surgical History:  Procedure Laterality Date  . TONSILLECTOMY      Allergies  Allergies  Allergen Reactions  . Bee Venom Nausea And Vomiting   HPI  Patient is a 36 y.o. male with a PMHx of obesity, who was admitted to [facility] on 10/16/2016 for evaluation of  Chest pain that woke him up from sleep. The patient was generally healthy until about a week ago when he will rash on his lower extremities and it was identified as fungal infection however didn't improve with cream. Earlier this week he get a tetanus shot after which he developed a fever. He woke this morning with retrosternal chest pressure/burning he went to the ER where he was given nitroglycerin that resolved his pain. His EKG showed diffuse ST elevation suspicious for pericarditis and his first troponin was 2.4. He was started on heparin drip. His second troponin was 2.3. Running diagnoses was acute myopericarditis. He was transferred from [place] to [facility] for further evaluation. After transfer he developed another episode of chest pain while at rest it required 2 nitroglycerin for the pain to resolve within 7 minutes. He is currently chest pain-free. Repeat EKG shows stable mild ST elevation in inferior leads.  The patient has very significant family history of premature coronary artery disease. His grandfather died of massive heart attack at age of 55. His mother had myocardial infarction at age of 70 and got a stent. She is alive. His father had myocardial infarction at age of 44 and received a stent. His uncle died of myocardial infarction in his early 71s. The patient has a very remote history of smoking.  Home  Medications  Prior to Admission medications   Medication Sig Start Date End Date Taking? Authorizing Provider  acetaminophen (TYLENOL) 325 MG tablet Take 325-650 mg by mouth every 6 (six) hours as needed for moderate pain. For pain   Yes Historical Provider, MD  ibuprofen (ADVIL,MOTRIN) 200 MG tablet Take 600 mg by mouth every 6 (six) hours as needed for headache.   Yes Historical Provider, MD  ketoconazole (NIZORAL) 2 % cream Apply 1 application topically 2 (two) times daily. 10/14/16  Yes Historical Provider, MD    Family History  Family History  Problem Relation Age of Onset  . Diabetes Mother   . Heart failure Mother   . Heart failure Father     Social History  Social History   Social History  . Marital status: Married    Spouse name: [name]  . Number of children: [name]  . Years of education: [name]   Occupational History  . Not on file.   Social History Main Topics  . Smoking status: Current Some Day Smoker    Packs/day: 0.50  . Smokeless tobacco: Never Used  . Alcohol use Yes  . Drug use: No  . Sexual activity: Not on file   Other Topics Concern  . Not on file   Social History Narrative  . No narrative on file     Review of Systems General:  No chills, fever, night sweats or weight changes.  Cardiovascular:  No chest pain, dyspnea on exertion, edema, orthopnea, palpitations, paroxysmal nocturnal dyspnea. Dermatological: No rash, lesions/masses Respiratory:  No cough, dyspnea Urologic: No hematuria, dysuria Abdominal:   No nausea, vomiting, diarrhea, bright red blood per rectum, melena, or hematemesis Neurologic:  No visual changes, wkns, changes in mental status. All other systems reviewed and are otherwise negative except as noted above.  Physical Exam  Blood pressure 116/80, pulse 64, temperature 98.1 F (36.7 C), temperature source Oral, resp. rate 12, height 6' (1.829 m), weight 283 lb (128.4 kg), SpO2 96 %.  General: Pleasant, NAD Psych: Normal  affect. Neuro: Alert and oriented X 3. Moves all extremities spontaneously. HEENT: Normal  Neck: Supple without bruits or JVD. Lungs:  Resp regular and unlabored, CTA. Heart: RRR no s3, s4, or murmurs. Abdomen: Soft, non-tender, non-distended, BS + x 4.  Extremities: No clubbing, cyanosis or edema. DP/PT/Radials 2+ and equal bilaterally.  Labs  No results for input(s): CKTOTAL, CKMB, TROPONINI in the last 72 hours. Lab Results  Component Value Date   WBC 4.5 10/16/2016   HGB 16.8 10/16/2016   HCT 49.3 10/16/2016   MCV 90.5 10/16/2016   PLT 187 10/16/2016    Recent Labs Lab 10/16/16 0908  NA 135  K 3.7  CL 100*  CO2 27  BUN 13  CREATININE 0.89  CALCIUM 8.8*  GLUCOSE 110*   Radiology/Studies  Dg Chest 2 View  Result Date: 10/16/2016 CLINICAL DATA:  Chest pain EXAM: CHEST  2 VIEW COMPARISON:  March 27, 2014 FINDINGS: The heart size and mediastinal contours are within normal limits. Both lungs are clear. The visualized skeletal structures are unremarkable. IMPRESSION: No active cardiopulmonary disease. Electronically Signed   By: Dorise Bullion III M.D   On: 10/16/2016 09:45   Echocardiogram - pending  ECG: SR, mild diffuse STE     ASSESSMENT AND PLAN  1. Chest pain with troponin elevation - with diffuse ST elevation on EKG suspicious for acute myopericarditis however patient has recurrent chest pain responding to nitroglycerin and significant family history for early coronary artery disease. Be performed stat bedside echo that shows normal regional wall motion abnormalities LVEF approximately 55% and no pericardial effusion. This was discussed with a STEMI doctor on call Dr. Martinique who agrees he needs rather earlier than later cardiac catheterization. He is currently on IV heparin, will add aspirin and atorvastatin 80 mg daily.  STEMI pager was activated and patient will be taken to the Cath Lab.  DVT PPX - Heparin IV drip   Signed, Ena Dawley, MD,  [facility] 10/16/2016, 3:16 PM

## 2016-10-16 NOTE — ED Triage Notes (Signed)
Pt reports pain in chest waking him from sleep at 0300 with diaphoresis.  Describes as heartburn.  Denies radiation or sob.  Took Pepto pta with no relief.

## 2016-10-16 NOTE — ED Notes (Signed)
DR. Meda Coffee aware of 2nd troponin 2.30

## 2016-10-17 ENCOUNTER — Encounter (HOSPITAL_COMMUNITY): Payer: Self-pay | Admitting: Physician Assistant

## 2016-10-17 DIAGNOSIS — I2510 Atherosclerotic heart disease of native coronary artery without angina pectoris: Secondary | ICD-10-CM

## 2016-10-17 MED ORDER — IBUPROFEN 400 MG PO TABS: 400.0000 mg | ORAL_TABLET | Freq: Three times a day (TID) | ORAL | 0 refills | Status: DC

## 2016-10-17 MED ORDER — CARVEDILOL 3.125 MG PO TABS: 3.1250 mg | ORAL_TABLET | Freq: Two times a day (BID) | ORAL | 3 refills | Status: DC

## 2016-10-17 MED ORDER — LISINOPRIL 2.5 MG PO TABS
2.5000 mg | ORAL_TABLET | Freq: Every day | ORAL | Status: DC
  Administered 2016-10-17: 2.5 mg via ORAL
  Filled 2016-10-17: qty 1

## 2016-10-17 MED ORDER — NITROGLYCERIN 0.4 MG SL SUBL: 0.4000 mg | SUBLINGUAL_TABLET | SUBLINGUAL | 12 refills | Status: DC | PRN

## 2016-10-17 MED ORDER — COLCHICINE 0.6 MG PO TABS: 0.6000 mg | ORAL_TABLET | Freq: Two times a day (BID) | ORAL | 0 refills | Status: DC

## 2016-10-17 MED ORDER — LISINOPRIL 2.5 MG PO TABS: 2.5000 mg | ORAL_TABLET | Freq: Every day | ORAL | 3 refills | Status: DC

## 2016-10-17 MED ORDER — ATORVASTATIN CALCIUM 40 MG PO TABS: 40.0000 mg | ORAL_TABLET | Freq: Every day | ORAL | 6 refills | Status: DC

## 2016-10-17 NOTE — Progress Notes (Addendum)
Patient Name: Juan Lopez Date of Encounter: 10/17/2016  Principal Problem:   Acute myopericarditis Active Problems:   NSTEMI (non-ST elevated myocardial infarction) [facility])   Essential hypertension   Family history of early CAD   Length of Stay: 1  SUBJECTIVE  The patient feels significantly better, no more chest pain.   CURRENT MEDS . atorvastatin  40 mg Oral q1800  . carvedilol  3.125 mg Oral BID WC  . colchicine  0.6 mg Oral BID  . enoxaparin (LOVENOX) injection  40 mg Subcutaneous Q24H  . ibuprofen  400 mg Oral TID  . sodium chloride flush  3 mL Intravenous Q12H   OBJECTIVE  Vitals:   10/16/16 1833 10/16/16 2110 10/17/16 0529 10/17/16 0739  BP: 140/90 136/86 114/79 120/68  Pulse: 75 84 64 61  Resp:    16  Temp:  98.2 F (36.8 C) 97.8 F (36.6 C) 97.6 F (36.4 C)  TempSrc:  Oral Oral Oral  SpO2:  94% 95% 96%  Weight:   278 lb 12.8 oz (126.5 kg)   Height:        Intake/Output Summary (Last 24 hours) at 10/17/16 0856 Last data filed at 10/17/16 0330  Gross per 24 hour  Intake          2563.15 ml  Output             1550 ml  Net          1013.15 ml   Filed Weights   10/16/16 0852 10/17/16 0529  Weight: 283 lb (128.4 kg) 278 lb 12.8 oz (126.5 kg)   PHYSICAL EXAM  General: Pleasant, NAD. Neuro: Alert and oriented X 3. Moves all extremities spontaneously. Psych: Normal affect. HEENT:  Normal  Neck: Supple without bruits or JVD. Lungs:  Resp regular and unlabored, CTA. Heart: RRR no s3, s4, or murmurs. Abdomen: Soft, non-tender, non-distended, BS + x 4.  Extremities: No clubbing, cyanosis or edema. DP/PT/Radials 2+ and equal bilaterally.  Accessory Clinical Findings  CBC  Recent Labs  10/16/16 0908 10/16/16 1843  WBC 4.5 6.5  HGB 16.8 16.7  HCT 49.3 47.9  MCV 90.5 89.9  PLT 187 [id number]   Basic Metabolic Panel  Recent Labs  10/16/16 0908 10/16/16 1843  NA 135  --   K 3.7  --   CL 100*  --   CO2 27  --   GLUCOSE 110*  --   BUN 13   --   CREATININE 0.89 0.85  CALCIUM 8.8*  --     Recent Labs  10/16/16 1626  TROPONINI 1.59*   Radiology/Studies  Dg Chest 2 View  Result Date: 10/16/2016 CLINICAL DATA:  Chest pain EXAM: CHEST  2 VIEW COMPARISON:  March 27, 2014 FINDINGS: The heart size and mediastinal contours are within normal limits. Both lungs are clear. The visualized skeletal structures are unremarkable. IMPRESSION: No active cardiopulmonary disease. Electronically Signed   By: Dorise Bullion III M.D   On: 10/16/2016 09:45   TELE:   ECG:   LHC: 10/16/2016  The left ventricular systolic function is normal.  LV end diastolic pressure is mildly elevated.  The left ventricular ejection fraction is 55-65% by visual estimate.   1. No significant CAD 2. Normal LV function 3. Mildly elevated LVEDP.  TTE: 10/16/2016 - Left ventricle: The cavity size was normal. Systolic function was   normal. The estimated ejection fraction was in the range of 55%   to 60%. Wall motion was normal; there  were no regional wall   motion abnormalities. Left ventricular diastolic function   parameters were normal. - Aortic valve: Trileaflet; normal thickness leaflets. There was no   regurgitation. - Aortic root: The aortic root was normal in size. - Left atrium: The atrium was normal in size. - Right ventricle: Systolic function was normal. - Tricuspid valve: There was mild regurgitation. - Pulmonic valve: There was no regurgitation. - Pulmonary arteries: Systolic pressure was within the normal   range. - Inferior vena cava: The vessel was normal in size. - Pericardium, extracardiac: There was no pericardial effusion.    ASSESSMENT AND PLAN  1. Chest pain with troponin elevation - with diffuse ST elevation on EKG suspicious for acute myopericarditis, left heart cath performed yesterday because of recurrent chest pain responding sl NTG, cath showed only minimal plaque in the LAD. Prognosis good, LVEF is preserved. We  will treat as an acute myopericarditis with  - ibuprofen 400 mg po TID x 2 weeks - colchicine 0.6 mg po BID x 3 months - carvedilol 3.125 mg po BID - lisinopril 2.5 mg po daily  2. CAD - minimal plaque, however significant FH in multiple family members - started on atorvastatin 40 mg po QHS  Plan: - discharge home today - outpatient follow up in 7-14 days - repeat echocardiogram for LVEF re-evaluation in 4 weeks - write note for work to avoid heavy lifting for the next 2-3 weeks, since his job is strenuous, we will write not to work for the next 2 weeks  - check lipids, CMP in 3-4 weeks  Signed, Ena Dawley MD, [facility] 10/17/2016

## 2016-10-17 NOTE — Progress Notes (Signed)
Pt as well as wife given education on pericarditis, medication & radial site care. Pt also informed of follow-up appointments. Pt given instructions to take home on all of the above items. All of both the pt as well as the pt's wife's questions were answered. Pt took all of his belongings home. [place] Brunette

## 2016-10-17 NOTE — Discharge Instructions (Signed)
Pericarditis Pericarditis is swelling and irritation (inflammation) of your pericardium. The pericardium is a thin, double-layered, fluid-filled sac that surrounds your heart. The pericardium protects and holds your heart in your chest cavity. Inflammation of your pericardium can cause rubbing (friction) between the two layers when your heart beats. Fluid may build up between the layers of the sac (pericardial effusion). Different types of pericarditis include:  Acute pericarditis. Inflammation develops suddenly and causes pericardial effusion.  Chronic pericarditis. Inflammation may develop gradually, or it may continue after acute pericarditis and last longer than 6 months.  Constrictive pericarditis. The layers of the pericardium stiffen and develop scar tissue. The scar tissue thickens and sticks together. This makes it difficult for the heart to pump and to work as it normally does. This type is rare. In most cases, pericarditis is acute and not serious. Chronic pericarditis and constrictive pericarditis may be more serious and may require treatment. What are the causes? Often, the cause of pericarditis is not known.If a cause is found, the cause may be:  A viral infection.  A heart attack (myocardial infarction).  Open-heart surgery (coronary artery bypass graft surgery).  Chest injury.  Autoimmune conditions, such as lupus or rheumatoid arthritis.  Kidney failure.  Low-functioning thyroid gland (hypothyroidism).  Cancer from another part of the body that has spread (metastasized) to the pericardium.  Radiation treatment.  Certain medicines, including some seizure medicines, blood thinners, heart medicines, and antibiotics.  A bacterial or fungal infection. This cause is less common. What increases the risk? The following factors may increase your risk of pericarditis:  Being male.  Being 84-60 years old.  Having had pericarditis before.  Having had a recent upper  respiratory tract infection. What are the signs or symptoms? The most common symptom of pericarditis is chest pain. This pain may:  Be in the center of your chest or the left side of your chest.  Not go away with rest.  Last for many hours or days.  Worsen when you lie down and go away when you sit up and lean forward.  Worsen when you swallow.  Move to your back, neck, or shoulder. Other symptoms may include:  A chronic, dry cough.  Heart palpitations. These may feel like rapid, fluttering, or pounding heartbeats.  Dizziness or fainting.  Tiredness or fatigue.  Fever.  Rapid breathing.  Shortness of breath when lying down. How is this diagnosed? This condition is diagnosed with a medical history, physical exam, and diagnostic tests. During your physical exam, your health care provider will listen for friction while your heart beats (pericardial rub). You may also have tests, including:  Blood work to look for signs of infection and inflammation.  Electrocardiogram (ECG).  Echocardiogram.  CT scan.  MRI.  Culture of pericardial fluid.  A tissue sample (biopsy) of the pericardium. If tests show that you may have constrictive pericarditis, you may have a procedure (cardiac catheterization) to confirm this diagnosis. How is this treated? Treatment for this condition depends on the cause and type of pericarditis. In most cases, acute pericarditis will clear up on its own within 10 days. Treatment for other types of pericarditis may include:  Medicines, such as:  NSAIDs for pain and inflammation.  Steroids to reduce inflammation.  Colchicine to relieve pain and inflammation.  A procedure to remove fluid using a needle (pericardiocentesis) if pericardial effusion puts pressure on the heart.  Surgery to remove part of the pericardium if constrictive pericarditis develops. If another condition is causing  your pericarditis, you may need treatment for that  underlying condition. Follow these instructions at home:  Do not use tobacco products, including cigarettes, chewing tobacco, or e-cigarettes. If you need help quitting, ask your health care provider.  Maintain a healthy weight.  Follow an exercise program as told by your health care provider. You may need to limit your exercise until your symptoms go away.  Eat a heart-healthy diet. A registered dietitian can help you to learn about healthy food choices.  Take over-the-counter and prescription medicines only as told by your health care provider. Keep a list of all of your medicines with you at all times. For each medicine, include information about the name, the dosage, how often you take it, and how you take it.  Keep all follow-up visits as told by your health care provider. This is important. Contact a health care provider if:  You continue to have symptoms of pericarditis.  You develop new symptoms of pericarditis.  Your symptoms get worse. Get help right away if:  You have worsening chest pain and difficulty breathing. These symptoms may represent a serious problem that is an emergency. Do not wait to see if the symptoms will go away. Get medical help right away. Call your local emergency services (911 in the [place] Do not drive yourself to the hospital.  This information is not intended to replace advice given to you by your health care provider. Make sure you discuss any questions you have with your health care provider. Document Released: 03/16/2001 Document Revised: 02/23/2016 Document Reviewed: 04/02/2015 Elsevier Interactive Patient Education  2017 [employer].

## 2016-10-17 NOTE — Discharge Summary (Signed)
Discharge Summary    Patient ID: Juan Lopez,  MRN: [id number], DOB/AGE: [birth year] 36 y.o.  Admit date: 10/16/2016 Discharge date: 10/17/2016  Primary Care Provider: Glo Herring Primary Cardiologist: Will establish care in [place].    Discharge Diagnoses    Principal Problem:   Acute myopericarditis Active Problems:   NSTEMI (non-ST elevated myocardial infarction) [facility])   Essential hypertension   Family history of early CAD   Coronary artery disease involving native coronary artery of native heart without angina pectoris   Allergies Allergies  Allergen Reactions  . Bee Venom Nausea And Vomiting     History of Present Illness     Juan Lopez is a 36 y.o. male with a history of obesity and family history of premature CAD who presented to [facility] on 10/16/16 with chest pain.  The patient was generally healthy until about a week ago when he will rash on his lower extremities and it was identified as fungal infection however didn't improve with cream. Earlier this week he get a tetanus shot after which he developed a fever. He woke yesterday morning with retrosternal chest pressure/burning he went to the [facility] ER where he was given nitroglycerin that resolved his pain. His EKG showed diffuse ST elevation suspicious for pericarditis and his first troponin was 2.4. He was started on heparin drip. His second troponin was 2.3. Running diagnoses were acute myopericarditis. He was transferred from [place] to [facility] for further evaluation. After transfer he developed another episode of chest pain while at rest it required 2 nitroglycerin for the pain to resolve within 7 minutes.    Hospital Course     Consultants: none   Chest pain with troponin elevation: with ongoing nitrate responsive chest pain, ECG changes and fam hx of premature CAD, left heart cath was performed on 10/16/16 which showed only minimal plaque in the LAD. 2D ECHO showed normal LV  function. He was felt to have acute myopericarditis with a very good prognosis. Peak tropoinin 2.4. Plan is to treat with:  - ibuprofen 400 mg po TID x 2 weeks - colchicine 0.6 mg po BID x 3 months - carvedilol 3.125 mg po BID - lisinopril 2.5 mg po daily - repeat echocardiogram for LVEF re-evaluation in 4 weeks  - work note written. He will need to avoid heavy lifting for next 2-3 weeks.   CAD: minimal plaque, however significant FH in multiple family members - started on atorvastatin 40 mg po QHS. Check lipids, CMP in 3-4 weeks  HTN: BP well controlled currently  The patient has had an uncomplicated hospital course and is recovering well. The radial catheter site is stable He / She has been seen by Dr. Meda Coffee today and deemed ready for discharge home. All follow-up appointments have been scheduled. A work excuse note was provided as well. Discharge medications are listed below.  _____________  Discharge Vitals Blood pressure 130/87, pulse 61, temperature 97.6 F (36.4 C), temperature source Oral, resp. rate 16, height 6' (1.829 m), weight 278 lb 12.8 oz (126.5 kg), SpO2 96 %.  Filed Weights   10/16/16 0852 10/17/16 0529  Weight: 283 lb (128.4 kg) 278 lb 12.8 oz (126.5 kg)    Labs & Radiologic Studies     CBC  Recent Labs  10/16/16 0908 10/16/16 1843  WBC 4.5 6.5  HGB 16.8 16.7  HCT 49.3 47.9  MCV 90.5 89.9  PLT 187 [id number]   Basic Metabolic Panel  Recent  Labs  10/16/16 0908 10/16/16 1843  NA 135  --   K 3.7  --   CL 100*  --   CO2 27  --   GLUCOSE 110*  --   BUN 13  --   CREATININE 0.89 0.85  CALCIUM 8.8*  --    Liver Function Tests No results for input(s): AST, ALT, ALKPHOS, BILITOT, PROT, ALBUMIN in the last 72 hours. No results for input(s): LIPASE, AMYLASE in the last 72 hours. Cardiac Enzymes  Recent Labs  10/16/16 1626  TROPONINI 1.59*   BNP Invalid input(s): POCBNP D-Dimer No results for input(s): DDIMER in the last 72 hours. Hemoglobin  A1C No results for input(s): HGBA1C in the last 72 hours. Fasting Lipid Panel No results for input(s): CHOL, HDL, LDLCALC, TRIG, CHOLHDL, LDLDIRECT in the last 72 hours. Thyroid Function Tests No results for input(s): TSH, T4TOTAL, T3FREE, THYROIDAB in the last 72 hours.  Invalid input(s): FREET3  Dg Chest 2 View  Result Date: 10/16/2016 CLINICAL DATA:  Chest pain EXAM: CHEST  2 VIEW COMPARISON:  March 27, 2014 FINDINGS: The heart size and mediastinal contours are within normal limits. Both lungs are clear. The visualized skeletal structures are unremarkable. IMPRESSION: No active cardiopulmonary disease. Electronically Signed   By: Dorise Bullion III M.D   On: 10/16/2016 09:45     Diagnostic Studies/Procedures    LHC: 10/16/2016  The left ventricular systolic function is normal.  LV end diastolic pressure is mildly elevated.  The left ventricular ejection fraction is 55-65% by visual estimate.  1. No significant CAD 2. Normal LV function 3. Mildly elevated LVEDP.    TTE: 10/16/2016 - Left ventricle: The cavity size was normal. Systolic function was normal. The estimated ejection fraction was in the range of 55% to 60%. Wall motion was normal; there were no regional wall motion abnormalities. Left ventricular diastolic function parameters were normal. - Aortic valve: Trileaflet; normal thickness leaflets. There was no regurgitation. - Aortic root: The aortic root was normal in size. - Left atrium: The atrium was normal in size. - Right ventricle: Systolic function was normal. - Tricuspid valve: There was mild regurgitation. - Pulmonic valve: There was no regurgitation. - Pulmonary arteries: Systolic pressure was within the normal range. - Inferior vena cava: The vessel was normal in size. - Pericardium, extracardiac: There was no pericardial effusion.    _____________    Disposition   Pt is being discharged home today in good  condition.  Follow-up Plans & Appointments    Follow-up Information    Jory Sims, NP Follow up on 10/26/2016.   Specialties:  Nurse Practitioner, Radiology, Cardiology Why:  @ 3:10pm. Please arrive at least 10 minutes early Contact information: [place] [address] [phone]            Discharge Medications     Medication List    TAKE these medications   acetaminophen 325 MG tablet Commonly known as:  TYLENOL Take 325-650 mg by mouth every 6 (six) hours as needed for moderate pain. For pain   atorvastatin 40 MG tablet Commonly known as:  LIPITOR Take 1 tablet (40 mg total) by mouth daily at 6 PM.   carvedilol 3.125 MG tablet Commonly known as:  COREG Take 1 tablet (3.125 mg total) by mouth 2 (two) times daily with a meal.   colchicine 0.6 MG tablet Take 1 tablet (0.6 mg total) by mouth 2 (two) times daily.   ibuprofen 400 MG tablet Commonly known  as:  ADVIL,MOTRIN Take 1 tablet (400 mg total) by mouth 3 (three) times daily. What changed:  medication strength  how much to take  when to take this  reasons to take this   ketoconazole 2 % cream Commonly known as:  NIZORAL Apply 1 application topically 2 (two) times daily.   lisinopril 2.5 MG tablet Commonly known as:  PRINIVIL,ZESTRIL Take 1 tablet (2.5 mg total) by mouth daily. Start taking on:  10/18/2016   nitroGLYCERIN 0.4 MG SL tablet Commonly known as:  NITROSTAT Place 1 tablet (0.4 mg total) under the tongue every 5 (five) minutes x 3 doses as needed for chest pain.       Aspirin prescribed at discharge?  No: on high dose NSAIDS High Intensity Statin Prescribed? (Lipitor 40-80mg  or Crestor 20-40mg ): YES Beta Blocker Prescribed? YES For EF 45% or less, Was ACEI/ARB Prescribed? YES, ef normal ADP Receptor Inhibitor Prescribed? No For EF <45%, Aldosterone Inhibitor Prescribed? No, ef normal  Was EF assessed during THIS hospitalization? YES Was Cardiac Rehab II ordered?  (Included Medically managed Patients): no   Outstanding Labs/Studies   Repeat lipids and CMP as well as an echo in 4 weeks.   Duration of Discharge Encounter   Greater than 30 minutes including physician time.  Signed, Angelena Form PA-C 10/17/2016, 10:14 AM

## 2016-10-18 ENCOUNTER — Telehealth: Payer: Self-pay | Admitting: *Deleted

## 2016-10-18 ENCOUNTER — Encounter (HOSPITAL_COMMUNITY): Payer: Self-pay | Admitting: Cardiology

## 2016-10-18 NOTE — Telephone Encounter (Incomplete)
Patient contacted regarding discharge from [facility] on 10/26/16  Patient understands to follow up with provider Bunnie Domino on 10/26/16 at {time} at [facility]. Patient understands discharge instructions? yes Patient understands medications and regiment? yes Patient understands to bring all medications to this visit? yes  Pt states he does have minimal swelling in his right hand. Pt denies any numbness, tingling, discoloration of right hand and denies any swelling or discoloration at site of right radial cath.

## 2016-10-18 NOTE — Telephone Encounter (Signed)
-----   Message from Eileen Stanford, PA-C sent at 10/17/2016  9:56 AM EST ----- Needs a TOC appt call for appointment with Jory Sims on 1/23. Thanks!

## 2016-10-18 NOTE — Telephone Encounter (Signed)
Patient contacted regarding discharge from [facility] on 10/17/16  Patient understands to follow up with provider Bunnie Domino on 10/26/16 3:10 PM at [facility].  Patient understands discharge instructions? yes Patient understands medications and regiment? yes Patient understands to bring all medications to this visit? yes  Pt states he does have minimal swelling in his right hand. Pt denies any numbness, tingling, discoloration of right hand, is able to use his hand normally, denies any swelling or discoloration at site of right radial cath.  Pt advised to keep right hand elevated, call our office if any changes.

## 2016-10-26 ENCOUNTER — Ambulatory Visit (INDEPENDENT_AMBULATORY_CARE_PROVIDER_SITE_OTHER): Payer: 59 | Admitting: Adult Health

## 2016-10-26 ENCOUNTER — Encounter: Payer: Self-pay | Admitting: Adult Health

## 2016-10-26 ENCOUNTER — Encounter: Payer: Self-pay | Admitting: *Deleted

## 2016-10-26 VITALS — BP 108/78 | HR 91 | Ht 72.0 in | Wt 278.0 lb

## 2016-10-26 DIAGNOSIS — B3323 Viral pericarditis: Secondary | ICD-10-CM | POA: Diagnosis not present

## 2016-10-26 DIAGNOSIS — I10 Essential (primary) hypertension: Secondary | ICD-10-CM | POA: Diagnosis not present

## 2016-10-26 DIAGNOSIS — I471 Supraventricular tachycardia: Secondary | ICD-10-CM

## 2016-10-26 MED ORDER — RANITIDINE HCL 150 MG PO TABS: 150.0000 mg | ORAL_TABLET | Freq: Every day | ORAL | 3 refills | Status: DC

## 2016-10-26 MED ORDER — METOPROLOL TARTRATE 25 MG PO TABS: 25.0000 mg | ORAL_TABLET | Freq: Three times a day (TID) | ORAL | 6 refills | Status: DC

## 2016-10-26 NOTE — Progress Notes (Signed)
Name: Juan Lopez    DOB: 08-17-1981  Age: 36 y.o.  MR#: [id number]       PCP:  [name], MD      Insurance: Payor: Onnie Boer / Plan: [employer] OTHER / Product Type: *No Product type* /   CC:    Chief Complaint  Patient presents with  . Chest Pain    Pericarditis    VS Vitals:   10/26/16 1520  Pulse: (!) 108  SpO2: 97%  Weight: 278 lb (126.1 kg)  Height: 6' (1.829 m)    Weights Current Weight  10/26/16 278 lb (126.1 kg)  10/17/16 278 lb 12.8 oz (126.5 kg)  02/19/15 234 lb (106.1 kg)    Blood Pressure  BP Readings from Last 3 Encounters:  10/17/16 130/87  02/19/15 148/96  03/27/14 127/86     Admit date:  (Not on file) Last encounter with RMR:  Visit date not found   Allergy Bee venom  Current Outpatient Prescriptions  Medication Sig Dispense Refill  . acetaminophen (TYLENOL) 325 MG tablet Take 325-650 mg by mouth every 6 (six) hours as needed for moderate pain. For pain    . atorvastatin (LIPITOR) 40 MG tablet Take 1 tablet (40 mg total) by mouth daily at 6 PM. 30 tablet 6  . carvedilol (COREG) 3.125 MG tablet Take 1 tablet (3.125 mg total) by mouth 2 (two) times daily with a meal. 60 tablet 3  . colchicine 0.6 MG tablet Take 1 tablet (0.6 mg total) by mouth 2 (two) times daily. 180 tablet 0  . ibuprofen (ADVIL,MOTRIN) 400 MG tablet Take 1 tablet (400 mg total) by mouth 3 (three) times daily. 42 tablet 0  . lisinopril (PRINIVIL,ZESTRIL) 2.5 MG tablet Take 1 tablet (2.5 mg total) by mouth daily. 30 tablet 3  . nitroGLYCERIN (NITROSTAT) 0.4 MG SL tablet Place 1 tablet (0.4 mg total) under the tongue every 5 (five) minutes x 3 doses as needed for chest pain. 25 tablet 12  . ketoconazole (NIZORAL) 2 % cream Apply 1 application topically 2 (two) times daily.     No current facility-administered medications for this visit.     Discontinued Meds:   There are no discontinued medications.  Patient Active Problem List   Diagnosis Date Noted  .  Coronary artery disease involving native coronary artery of native heart without angina pectoris   . NSTEMI (non-ST elevated myocardial infarction) ([place]) 10/16/2016  . Acute myopericarditis 10/16/2016  . Essential hypertension 10/16/2016  . Family history of early CAD 10/16/2016    LABS    Component Value Date/Time   NA 135 10/16/2016 0908   NA 139 03/27/2014 1637   K 3.7 10/16/2016 0908   K 3.9 03/27/2014 1637   CL 100 (L) 10/16/2016 0908   CL 100 03/27/2014 1637   CO2 27 10/16/2016 0908   CO2 23 03/27/2014 1637   GLUCOSE 110 (H) 10/16/2016 0908   GLUCOSE 131 (H) 03/27/2014 1637   BUN 13 10/16/2016 0908   BUN 10 03/27/2014 1637   CREATININE 0.85 10/16/2016 1843   CREATININE 0.89 10/16/2016 0908   CREATININE 0.84 03/27/2014 1637   CALCIUM 8.8 (L) 10/16/2016 0908   CALCIUM 9.2 03/27/2014 1637   GFRNONAA >60 10/16/2016 1843   GFRNONAA >60 10/16/2016 0908   GFRNONAA >90 03/27/2014 1637   GFRAA >60 10/16/2016 1843   GFRAA >60 10/16/2016 0908   GFRAA >90 03/27/2014 1637   CMP     Component Value Date/Time   NA 135  10/16/2016 0908   K 3.7 10/16/2016 0908   CL 100 (L) 10/16/2016 0908   CO2 27 10/16/2016 0908   GLUCOSE 110 (H) 10/16/2016 0908   BUN 13 10/16/2016 0908   CREATININE 0.85 10/16/2016 1843   CALCIUM 8.8 (L) 10/16/2016 0908   PROT 7.3 03/27/2014 1637   ALBUMIN 4.0 03/27/2014 1637   AST 21 03/27/2014 1637   ALT 41 03/27/2014 1637   ALKPHOS 101 03/27/2014 1637   BILITOT 0.2 (L) 03/27/2014 1637   GFRNONAA >60 10/16/2016 1843   GFRAA >60 10/16/2016 1843       Component Value Date/Time   WBC 6.5 10/16/2016 1843   WBC 4.5 10/16/2016 0908   WBC 9.1 03/27/2014 1637   HGB 16.7 10/16/2016 1843   HGB 16.8 10/16/2016 0908   HGB 18.5 (H) 03/27/2014 1637   HCT 47.9 10/16/2016 1843   HCT 49.3 10/16/2016 0908   HCT 53.2 (H) 03/27/2014 1637   MCV 89.9 10/16/2016 1843   MCV 90.5 10/16/2016 0908   MCV 90.8 03/27/2014 1637    Lipid Panel  No results found for:  CHOL, TRIG, HDL, CHOLHDL, VLDL, LDLCALC, LDLDIRECT  ABG No results found for: PHART, PCO2ART, PO2ART, HCO3, TCO2, ACIDBASEDEF, O2SAT   No results found for: TSH BNP (last 3 results) No results for input(s): BNP in the last 8760 hours.  ProBNP (last 3 results) No results for input(s): PROBNP in the last 8760 hours.  Cardiac Panel (last 3 results) No results for input(s): CKTOTAL, CKMB, TROPONINI, RELINDX in the last 72 hours.  Iron/TIBC/Ferritin/ %Sat No results found for: IRON, TIBC, FERRITIN, IRONPCTSAT   EKG Orders placed or performed during the hospital encounter of 10/16/16  . ED EKG within 10 minutes  . ED EKG within 10 minutes  . EKG 12-Lead  . EKG 12-Lead  . EKG 12-Lead  . EKG 12-Lead  . EKG     Prior Assessment and Plan Problem List as of 10/26/2016 Never Reviewed     Cardiovascular and Mediastinum   NSTEMI (non-ST elevated myocardial infarction) ([place])   Acute myopericarditis   Essential hypertension   Coronary artery disease involving native coronary artery of native heart without angina pectoris     Other   Family history of early CAD       Imaging: Dg Chest 2 View  Result Date: 10/16/2016 CLINICAL DATA:  Chest pain EXAM: CHEST  2 VIEW COMPARISON:  March 27, 2014 FINDINGS: The heart size and mediastinal contours are within normal limits. Both lungs are clear. The visualized skeletal structures are unremarkable. IMPRESSION: No active cardiopulmonary disease. Electronically Signed   By: Dorise Bullion III M.D   On: 10/16/2016 09:45

## 2016-10-26 NOTE — Progress Notes (Signed)
Cardiology Office Note   Date:  10/26/2016   ID:  Juan Lopez, DOB [birth year], MRN [id number]  PCP:  [name], MD  Cardiologist: To be established/  Jory Sims, NP   Chief Complaint  Patient presents with  . Chest Pain    Pericarditis      History of Present Illness: Juan Lopez is a 36 y.o. male who presents for posthospitalization follow-up, to be established in the [place] office, after admission for non-ST elevation MI, acute myopericarditis, hypertension, and known history of coronary artery disease. The patient was admitted from [facility] to [facility] in the setting of elevated troponin of 2.4, EKG revealed diffuse ST elevation suspicious for pericarditis. He had a cardiac catheterization on 10/16/2006 revealing no significant CAD, normal LV function of 50-65%, with mildly elevated LVEDP.  LHC: 10/16/2016  The left ventricular systolic function is normal.  LV end diastolic pressure is mildly elevated.  The left ventricular ejection fraction is 55-65% by visual estimate.  1. No significant CAD 2. Normal LV function 3. Mildly elevated LVEDP.  TTE: 10/16/2016 - Left ventricle: The cavity size was normal. Systolic function was normal. The estimated ejection fraction was in the range of 55% to 60%. Wall motion was normal; there were no regional wall motion abnormalities. Left ventricular diastolic function parameters were normal. - Aortic valve: Trileaflet; normal thickness leaflets. There was no regurgitation. - Aortic root: The aortic root was normal in size. - Left atrium: The atrium was normal in size. - Right ventricle: Systolic function was normal. - Tricuspid valve: There was mild regurgitation. - Pulmonic valve: There was no regurgitation. - Pulmonary arteries: Systolic pressure was within the normal range. - Inferior vena cava: The vessel was normal in size. - Pericardium, extracardiac: There was no  pericardial effusion   His main complaint today is that of heart racing. He states that he feels anxious and his HR will go up for several minutes and then return to normal. He is sore afterwards. He does have some dyspnea associated with this. He is medically compliant. He has had some diarrhea with the colchicine.   His wife who is in attendance has multiple questions concerning chances of recurrence, and if prior infections that he was being treated for had anything to with his current myocarditis. He had a fungal infection and rash on his legs, and got a flu shot just before he began his symptoms.    Past Medical History:  Diagnosis Date  . Acute myopericarditis    a. dx 10/2016  . CAD (coronary artery disease)    a. LHC 10/2016 with mild luminal irregularities. Placed on statin   . Family history of early CAD   . HTN (hypertension)     Past Surgical History:  Procedure Laterality Date  . CARDIAC CATHETERIZATION [name] 10/16/2016   Procedure: Left Heart Cath and Coronary Angiography;  Surgeon: Peter M Martinique, MD;  Location: [facility];  Service: Cardiovascular;  Laterality: [name];  . TONSILLECTOMY       Current Outpatient Prescriptions  Medication Sig Dispense Refill  . acetaminophen (TYLENOL) 325 MG tablet Take 325-650 mg by mouth every 6 (six) hours as needed for moderate pain. For pain    . atorvastatin (LIPITOR) 40 MG tablet Take 1 tablet (40 mg total) by mouth daily at 6 PM. 30 tablet 6  . colchicine 0.6 MG tablet Take 1 tablet (0.6 mg total) by mouth 2 (two) times daily. 180 tablet 0  .  ibuprofen (ADVIL,MOTRIN) 400 MG tablet Take 1 tablet (400 mg total) by mouth 3 (three) times daily. 42 tablet 0  . lisinopril (PRINIVIL,ZESTRIL) 2.5 MG tablet Take 1 tablet (2.5 mg total) by mouth daily. 30 tablet 3  . nitroGLYCERIN (NITROSTAT) 0.4 MG SL tablet Place 1 tablet (0.4 mg total) under the tongue every 5 (five) minutes x 3 doses as needed for chest pain. 25 tablet 12  .  ketoconazole (NIZORAL) 2 % cream Apply 1 application topically 2 (two) times daily.     No current facility-administered medications for this visit.     Allergies:   Bee venom    Social History:  The patient  reports that he quit smoking about 13 months ago. He smoked 0.50 packs per day. He has never used smokeless tobacco. He reports that he drinks alcohol. He reports that he does not use drugs.   Family History:  The patient's family history includes Diabetes in his mother and paternal grandmother; Heart attack in his maternal grandmother and paternal grandfather; Heart failure in his father and mother; Obesity in his paternal grandmother; Pneumonia in his maternal grandfather.    ROS: All other systems are reviewed and negative. Unless otherwise mentioned in H&P    PHYSICAL EXAM: VS:  BP 108/78   Pulse 91   Ht 6' (1.829 m)   Wt 278 lb (126.1 kg)   SpO2 97%   BMI 37.70 kg/m  , BMI Body mass index is 37.7 kg/m. GEN: Well nourished, well developed, in no acute distress  HEENT: normal  Neck: no JVD, carotid bruits, or masses Cardiac: RRR; tachycardic, no murmurs, rubs, or gallops,no edema  Respiratory: Clear to auscultation bilaterally, normal work of breathing GI: soft, nontender, nondistended, + BS MS: no deformity or atrophy  Skin: warm and dry, no rash Neuro:  Strength and sensation are intact Psych: euthymic mood, full affect  Recent Labs: 10/16/2016: BUN 13; Creatinine, Ser 0.85; Hemoglobin 16.7; Platelets 201; Potassium 3.7; Sodium 135    Lipid Panel No results found for: CHOL, TRIG, HDL, CHOLHDL, VLDL, LDLCALC, LDLDIRECT    Wt Readings from Last 3 Encounters:  10/26/16 278 lb (126.1 kg)  10/17/16 278 lb 12.8 oz (126.5 kg)  02/19/15 234 lb (106.1 kg)      Other studies Reviewed: None  ASSESSMENT AND PLAN:  1.  Pericarditis: Continues to have soreness in his chest and some dyspnea. Will keep him on the colchicine a minimum of 3 months. Maybe longer if he  remains symptomatic. NSAIDS are helpful with his pain. He will be out of work for one more month. He will follow up with cardiologist in [place] to be established for ongoing management on next appointment. Rx for Zantac 150 mg daily is given due to GI burning on NSAID'S. He can use Imodium for symptomatic relief of diarrhea. Will check BMET in two weeks.  2. Tachycardia: Will change coreg to lopressor 25 mg TID. This cardioselective BB may more efficiatous in addressing rapid HR.   3. Hypertension: BP is controlled currently on lisinopril. Will need follow up for BP control on change to lopressor.   Current medicines are reviewed at length with the patient today.    Labs/ tests ordered today include: BMET No orders of the defined types were placed in this encounter.    Disposition:   FU with 1 month. To Be Established with cardiologist.  Signed, Jory Sims, NP  10/26/2016 3:53 PM    [facility] [id number]  [address], [place], [place] [address] Phone: [phone]; Fax: [phone]

## 2016-10-26 NOTE — Patient Instructions (Signed)
Your physician recommends that you schedule a follow-up appointment in: 1 Month.   Your physician has recommended you make the following change in your medication:  STOP Taking Coreg  Start taking Zantac 150 mg Daily  Start Taking Lopressor 25 mg Three Times Daily   Your physician recommends that you return for lab work in: [place] have been given a [employer] manager for work.  If you need a refill on your cardiac medications before your next appointment, please call your pharmacy.  Thank you for choosing [facility]!

## 2016-11-02 ENCOUNTER — Other Ambulatory Visit: Payer: Self-pay | Admitting: Physician Assistant

## 2016-11-04 ENCOUNTER — Telehealth: Payer: Self-pay | Admitting: Adult Health

## 2016-11-04 MED ORDER — POTASSIUM CHLORIDE ER 10 MEQ PO TBCR: 10.0000 meq | EXTENDED_RELEASE_TABLET | Freq: Every day | ORAL | 6 refills | Status: DC

## 2016-11-04 NOTE — Telephone Encounter (Signed)
Patient would like to speak with nurse regarding cramping after starting Potassium. / tg

## 2016-11-04 NOTE — Addendum Note (Signed)
Addended by: Levonne Hubert on: 11/04/2016 03:41 PM   Modules accepted: Orders

## 2016-11-04 NOTE — Telephone Encounter (Signed)
Patient has diarrhea since [place] day after hospitalization. Now his arms,legs, and stomach are cramping. He is going to reach out to pcp and see if they will see him or need stool sample.He called here for advice

## 2016-11-04 NOTE — Telephone Encounter (Signed)
Would see PCP. Potassium should help with cramping.

## 2016-11-09 ENCOUNTER — Other Ambulatory Visit (HOSPITAL_COMMUNITY)
Admission: RE | Admit: 2016-11-09 | Discharge: 2016-11-09 | Disposition: A | Discharge status: Home / Self Care | Payer: 59 | Source: Ambulatory Visit | Attending: Adult Health | Admitting: Adult Health

## 2016-11-09 ENCOUNTER — Telehealth: Payer: Self-pay | Admitting: *Deleted

## 2016-11-09 DIAGNOSIS — I10 Essential (primary) hypertension: Secondary | ICD-10-CM | POA: Insufficient documentation

## 2016-11-09 LAB — BASIC METABOLIC PANEL
ANION GAP: 8 (ref 5–15)
BUN: 10 mg/dL (ref 6–20)
CHLORIDE: 104 mmol/L (ref 101–111)
CO2: 26 mmol/L (ref 22–32)
CREATININE: 0.86 mg/dL (ref 0.61–1.24)
Calcium: 9 mg/dL (ref 8.9–10.3)
Glucose, Bld: 115 mg/dL — ABNORMAL HIGH (ref 65–99)
Potassium: 4.2 mmol/L (ref 3.5–5.1)
SODIUM: 138 mmol/L (ref 135–145)

## 2016-11-09 NOTE — Telephone Encounter (Signed)
Called patient with test results. No answer. Left message to call back.  

## 2016-11-09 NOTE — Telephone Encounter (Signed)
-----   Message from Lendon Colonel, NP sent at 11/09/2016  2:38 PM EST ----- Labs reviewed. Slight elevation in glucose. May have eaten that day. Creatinine is normal. No changes in medication regimen.

## 2016-11-26 ENCOUNTER — Ambulatory Visit (INDEPENDENT_AMBULATORY_CARE_PROVIDER_SITE_OTHER): Payer: 59 | Admitting: Cardiology

## 2016-11-26 ENCOUNTER — Encounter: Payer: Self-pay | Admitting: Cardiology

## 2016-11-26 VITALS — BP 108/70 | HR 89 | Ht 72.0 in | Wt 287.0 lb

## 2016-11-26 DIAGNOSIS — R002 Palpitations: Secondary | ICD-10-CM

## 2016-11-26 DIAGNOSIS — I309 Acute pericarditis, unspecified: Secondary | ICD-10-CM | POA: Diagnosis not present

## 2016-11-26 MED ORDER — COLCHICINE 0.6 MG PO TABS: 0.6000 mg | ORAL_TABLET | Freq: Every day | ORAL | 3 refills | Status: DC

## 2016-11-26 MED ORDER — DILTIAZEM HCL 30 MG PO TABS: 30.0000 mg | ORAL_TABLET | Freq: Two times a day (BID) | ORAL | 3 refills | Status: DC

## 2016-11-26 NOTE — Patient Instructions (Addendum)
Your physician recommends that you schedule a follow-up appointment in: 3-4 weeks Dr Harl Bowie   Continue Ibuprofen 400 mg three times a day   DECREASE colchicine to 0.6 mg once daily   STOP Lopressor   START Diltiazem 30 mg twice a day    Call in 1 week with update      Thank you for choosing [facility] !

## 2016-11-26 NOTE — Progress Notes (Signed)
Clinical Summary Juan Lopez is a 36 y.o.male last seen by NP Purcell Nails, this is our first visit together.  1. Myopericarditis - admission Jan 2018 with chest pain. Troponin up to 2.4, EKG suspicious for pericarditis - cath showed no significant CAD - discharged on ibuprofen 469m tid x 2 weeks, and colchcine 0.624mbid x 3 months.  - echo Jan 2018 LVEF 55-60%, no WMAs, no effusion.   -he is currently taking  ibuprofen 40085mid. Colchicine 0.6mg38md. - can have chest pain with exertion at times. Lingers and resolves. - recent fatigue and diarrhea likely due to colchicine   2. Palpitations - on lopressor 25mg40m. - can have some occasional symptoms.  - significant fatigue recently possibly related.   3. Fatigue - not sleeping at well - insomnia, difficult falling asleep. He is unsure of the reason.     Past Medical History:  Diagnosis Date  . Acute myopericarditis    a. dx 10/2016  . CAD (coronary artery disease)    a. LHC 10/2016 with mild luminal irregularities. Placed on statin   . Family history of early CAD   . HTN (hypertension)      Allergies  Allergen Reactions  . Bee Venom Nausea And Vomiting     Current Outpatient Prescriptions  Medication Sig Dispense Refill  . acetaminophen (TYLENOL) 325 MG tablet Take 325-650 mg by mouth every 6 (six) hours as needed for moderate pain. For pain    . atorvastatin (LIPITOR) 40 MG tablet Take 1 tablet (40 mg total) by mouth daily at 6 PM. 30 tablet 6  . colchicine 0.6 MG tablet Take 1 tablet (0.6 mg total) by mouth 2 (two) times daily. 180 tablet 0  . ibuprofen (ADVIL,MOTRIN) 400 MG tablet TAKE 1 TABLET (400 MG TOTAL) BY MOUTH 3 (THREE) TIMES DAILY. 42 tablet 0  . ketoconazole (NIZORAL) 2 % cream Apply 1 application topically 2 (two) times daily.    . lisMarland Kitchennopril (PRINIVIL,ZESTRIL) 2.5 MG tablet Take 1 tablet (2.5 mg total) by mouth daily. 30 tablet 3  . metoprolol tartrate (LOPRESSOR) 25 MG tablet Take 1 tablet (25 mg  total) by mouth 3 (three) times daily. 90 tablet 6  . nitroGLYCERIN (NITROSTAT) 0.4 MG SL tablet Place 1 tablet (0.4 mg total) under the tongue every 5 (five) minutes x 3 doses as needed for chest pain. 25 tablet 12  . potassium chloride (K-DUR) 10 MEQ tablet Take 1 tablet (10 mEq total) by mouth daily. 30 tablet 6  . ranitidine (ZANTAC) 150 MG tablet Take 1 tablet (150 mg total) by mouth daily. 90 tablet 3   No current facility-administered medications for this visit.      Past Surgical History:  Procedure Laterality Date  . CARDIAC CATHETERIZATION [name] 10/16/2016   Procedure: Left Heart Cath and Coronary Angiography;  Surgeon: Peter M JordaMartinique  [facility];  Service: Cardiovascular;  Laterality: [name];  . TONSILLECTOMY       Allergies  Allergen Reactions  . Bee Venom Nausea And Vomiting      Family History  Problem Relation Age of Onset  . Diabetes Mother   . Heart failure Mother   . Heart failure Father   . Heart attack Maternal Grandmother   . Pneumonia Maternal Grandfather   . Diabetes Paternal Grandmother   . Obesity Paternal Grandmother   . Heart attack Paternal Grandfather      Social History Juan Lopez that he quit smoking about  14 months ago. He smoked 0.50 packs per day. He has never used smokeless tobacco. Juan Lopez reports that he drinks alcohol.   Review of Systems CONSTITUTIONAL: No weight loss, fever, chills, weakness or fatigue.  HEENT: Eyes: No visual loss, blurred vision, double vision or yellow sclerae.No hearing loss, sneezing, congestion, runny nose or sore throat.  SKIN: No rash or itching.  CARDIOVASCULAR: per HPI RESPIRATORY: No shortness of breath, cough or sputum.  GASTROINTESTINAL: +diarrhea GENITOURINARY: No burning on urination, no polyuria NEUROLOGICAL: No headache, dizziness, syncope, paralysis, ataxia, numbness or tingling in the extremities. No change in bowel or bladder control.  MUSCULOSKELETAL: No muscle,  back pain, joint pain or stiffness.  LYMPHATICS: No enlarged nodes. No history of splenectomy.  PSYCHIATRIC: No history of depression or anxiety.  ENDOCRINOLOGIC: No reports of sweating, cold or heat intolerance. No polyuria or polydipsia.  Marland Kitchen   Physical Examination Vitals:   11/26/16 1525  BP: 108/70  Pulse: 89   Vitals:   11/26/16 1525  Weight: 287 lb (130.2 kg)  Height: 6' (1.829 m)    Gen: resting comfortably, no acute distress HEENT: no scleral icterus, pupils equal round and reactive, no palptable cervical adenopathy,  CV: RRR, no m/r/g, no jvd Resp: Clear to auscultation bilaterally GI: abdomen is soft, non-tender, non-distended, normal bowel sounds, no hepatosplenomegaly MSK: extremities are warm, no edema.  Skin: warm, no rash Neuro:  no focal deficits Psych: appropriate affect    Assessment and Plan  1. Myopericarditis - some chest pain at times. He will increase his ibuprofen to 461m tid. Due to GI side effects decrease colchcine to 0.62mdaily - if ongoing chest pain at next visit will check ESR/CRP to evalute for ongoing inflammation. He will call [place] 1 week, if improved pain will start tapering down NSAIDs.  - discharge summary mentions repeating an echo. His recent echo was normal with no effusion, at this time I do not see a strong indication to repeat. If symptoms progress consider at that time.   2. Palpitations - significant fatigue possibly related to lopressor, though he has some some insomnia as well - will change lopressor to dilt 3052mid and follow symptoms   In setting of recent myopericarditis and ongoing chest pain have provided a work excuse for additional month.      JonArnoldo Lenis.D.

## 2016-12-15 ENCOUNTER — Telehealth: Payer: Self-pay | Admitting: Cardiology

## 2016-12-15 NOTE — Telephone Encounter (Signed)
Left detailed message on pt's private voicemail.  

## 2016-12-15 NOTE — Telephone Encounter (Signed)
If feeling better would decrease ibuprofen to 400mg  bid, continue colchicine 0.6mg  daily   Zandra Abts MD

## 2016-12-15 NOTE — Telephone Encounter (Signed)
Would like to speak with nurse regarding medicaitons.t / tg

## 2016-12-15 NOTE — Telephone Encounter (Signed)
Pt called to inform us that since decreasing medications he has felt a lot better. He will keep appointment on 3/30 to discuss decreasing medications more. ( Just an [place])

## 2016-12-31 ENCOUNTER — Ambulatory Visit (INDEPENDENT_AMBULATORY_CARE_PROVIDER_SITE_OTHER): Payer: 59 | Admitting: Cardiology

## 2016-12-31 ENCOUNTER — Encounter: Payer: Self-pay | Admitting: Cardiology

## 2016-12-31 VITALS — BP 128/80 | HR 92 | Ht 72.0 in | Wt 289.0 lb

## 2016-12-31 DIAGNOSIS — I309 Acute pericarditis, unspecified: Secondary | ICD-10-CM

## 2016-12-31 DIAGNOSIS — R002 Palpitations: Secondary | ICD-10-CM

## 2016-12-31 NOTE — Progress Notes (Signed)
Clinical Summary Juan Lopez is a 36 y.o.male seen today for follow up of the following medical problems.   1. Myopericarditis - admission Jan 2018 with chest pain. Troponin up to 2.4, EKG suspicious for pericarditis - cath showed no significant CAD - discharged on ibuprofen 400mg  tid x 2 weeks, and colchcine 0.6mg  bid x 3 months.  - echo Jan 2018 LVEF 55-60%, no WMAs, no effusion.    - last visit he had some lingering chest pain. We increased his ibuprofen to 400mg  tid for several days. GI side effects and fatigue on colchcine, we lowered to 0.6mg  daily.  - with changes he is feeling better, we lowered his ibuprofen to 400mg  bid about a week ago.  - vague very mild chest pain x 2 since last visit. Lasted 10-15 minutes. Better with movement - energy level improving.     2. Palpitations - on lopressor 25mg  tid. - can have some occasional symptoms.  - significant fatigue recently possibly related.   - last visit we changed lopressor to dilt due to fatigue.  - occasional palpitations with high levels of activity only      SH: works at  Past Medical History:  Diagnosis Date  . Acute myopericarditis    a. dx 10/2016  . CAD (coronary artery disease)    a. LHC 10/2016 with mild luminal irregularities. Placed on statin   . Family history of early CAD   . HTN (hypertension)      Allergies  Allergen Reactions  . Bee Venom Nausea And Vomiting     Current Outpatient Prescriptions  Medication Sig Dispense Refill  . acetaminophen (TYLENOL) 325 MG tablet Take 325-650 mg by mouth every 6 (six) hours as needed for moderate pain. For pain    . atorvastatin (LIPITOR) 40 MG tablet Take 1 tablet (40 mg total) by mouth daily at 6 PM. 30 tablet 6  . colchicine 0.6 MG tablet Take 1 tablet (0.6 mg total) by mouth daily. 30 tablet 3  . diltiazem (CARDIZEM) 30 MG tablet Take 1 tablet (30 mg total) by mouth 2 (two) times daily. 60 tablet 3  . ibuprofen (ADVIL,MOTRIN) 400 MG tablet  TAKE 1 TABLET (400 MG TOTAL) BY MOUTH 3 (THREE) TIMES DAILY. 42 tablet 0  . ketoconazole (NIZORAL) 2 % cream Apply 1 application topically 2 (two) times daily.    Marland Kitchen lisinopril (PRINIVIL,ZESTRIL) 2.5 MG tablet Take 1 tablet (2.5 mg total) by mouth daily. 30 tablet 3  . nitroGLYCERIN (NITROSTAT) 0.4 MG SL tablet Place 1 tablet (0.4 mg total) under the tongue every 5 (five) minutes x 3 doses as needed for chest pain. 25 tablet 12  . potassium chloride (K-DUR) 10 MEQ tablet Take 1 tablet (10 mEq total) by mouth daily. 30 tablet 6  . ranitidine (ZANTAC) 150 MG tablet Take 1 tablet (150 mg total) by mouth daily. 90 tablet 3   No current facility-administered medications for this visit.      Past Surgical History:  Procedure Laterality Date  . CARDIAC CATHETERIZATION [name] 10/16/2016   Procedure: Left Heart Cath and Coronary Angiography;  Surgeon: Peter M Martinique, MD;  Location: [facility];  Service: Cardiovascular;  Laterality: [name];  . TONSILLECTOMY       Allergies  Allergen Reactions  . Bee Venom Nausea And Vomiting      Family History  Problem Relation Age of Onset  . Diabetes Mother   . Heart failure Mother   . Heart failure Father   .  Heart attack Maternal Grandmother   . Pneumonia Maternal Grandfather   . Diabetes Paternal Grandmother   . Obesity Paternal Grandmother   . Heart attack Paternal Grandfather      Social History Juan Lopez reports that he quit smoking about 15 months ago. He smoked 0.50 packs per day. He has never used smokeless tobacco. Juan Lopez reports that he drinks alcohol.   Review of Systems CONSTITUTIONAL: No weight loss, fever, chills, weakness or fatigue.  HEENT: Eyes: No visual loss, blurred vision, double vision or yellow sclerae.No hearing loss, sneezing, congestion, runny nose or sore throat.  SKIN: No rash or itching.  CARDIOVASCULAR: per hpi RESPIRATORY: No shortness of breath, cough or sputum.  GASTROINTESTINAL: No anorexia, nausea,  vomiting or diarrhea. No abdominal pain or blood.  GENITOURINARY: No burning on urination, no polyuria NEUROLOGICAL: No headache, dizziness, syncope, paralysis, ataxia, numbness or tingling in the extremities. No change in bowel or bladder control.  MUSCULOSKELETAL: No muscle, back pain, joint pain or stiffness.  LYMPHATICS: No enlarged nodes. No history of splenectomy.  PSYCHIATRIC: No history of depression or anxiety.  ENDOCRINOLOGIC: No reports of sweating, cold or heat intolerance. No polyuria or polydipsia.  Marland Kitchen   Physical Examination Vitals:   12/31/16 1301  BP: 128/80  Pulse: 92   Vitals:   12/31/16 1301  Weight: 289 lb (131.1 kg)  Height: 6' (1.829 m)    Gen: resting comfortably, no acute distress HEENT: no scleral icterus, pupils equal round and reactive, no palptable cervical adenopathy,  CV: RRR, no m/r,g no jvd Resp: Clear to auscultation bilaterally GI: abdomen is soft, non-tender, non-distended, normal bowel sounds, no hepatosplenomegaly MSK: extremities are warm, no edema.  Skin: warm, no rash Neuro:  no focal deficits Psych: appropriate affect   Diagnostic Studies     Assessment and Plan  1. Myopericarditis - doing well. We will d/c iburprofen. Continue colchcine at this time - will clear to go back to work   2. Palpitations - doing well, continue diltiazem.    F/u 3 months     Arnoldo Lenis, M.D.

## 2016-12-31 NOTE — Patient Instructions (Signed)
Medication Instructions:  STOP IBUPROFEN   Labwork: NONE  Testing/Procedures: NONE  Follow-Up: Your physician recommends that you schedule a follow-up appointment in: 3 MONTHS    Any Other Special Instructions Will Be Listed Below (If Applicable).  I HAVE GIVEN YOU A NOTE TO RETURN TO WORK    If you need a refill on your cardiac medications before your next appointment, please call your pharmacy.

## 2017-01-26 DIAGNOSIS — I10 Essential (primary) hypertension: Secondary | ICD-10-CM | POA: Diagnosis not present

## 2017-01-26 DIAGNOSIS — I319 Disease of pericardium, unspecified: Secondary | ICD-10-CM | POA: Diagnosis not present

## 2017-02-13 ENCOUNTER — Other Ambulatory Visit: Payer: Self-pay | Admitting: Physician Assistant

## 2017-02-15 DIAGNOSIS — R748 Abnormal levels of other serum enzymes: Secondary | ICD-10-CM | POA: Diagnosis not present

## 2017-02-15 DIAGNOSIS — I10 Essential (primary) hypertension: Secondary | ICD-10-CM | POA: Diagnosis not present

## 2017-02-15 DIAGNOSIS — Z1389 Encounter for screening for other disorder: Secondary | ICD-10-CM | POA: Diagnosis not present

## 2017-03-21 ENCOUNTER — Other Ambulatory Visit: Payer: Self-pay | Admitting: Cardiology

## 2017-06-03 ENCOUNTER — Ambulatory Visit (HOSPITAL_COMMUNITY)
Admission: RE | Admit: 2017-06-03 | Discharge: 2017-06-03 | Disposition: A | Discharge status: Home / Self Care | Payer: 59 | Source: Ambulatory Visit | Attending: Family Medicine | Admitting: Family Medicine

## 2017-06-03 ENCOUNTER — Other Ambulatory Visit (HOSPITAL_COMMUNITY): Payer: Self-pay | Admitting: Family Medicine

## 2017-06-03 DIAGNOSIS — M79641 Pain in right hand: Secondary | ICD-10-CM | POA: Insufficient documentation

## 2017-06-03 DIAGNOSIS — S6991XA Unspecified injury of right wrist, hand and finger(s), initial encounter: Secondary | ICD-10-CM | POA: Diagnosis not present

## 2017-08-10 ENCOUNTER — Ambulatory Visit: Payer: 59 | Admitting: Cardiology

## 2017-08-10 ENCOUNTER — Encounter: Payer: Self-pay | Admitting: Cardiology

## 2017-08-10 VITALS — BP 140/98 | HR 97 | Ht 72.0 in | Wt 285.0 lb

## 2017-08-10 DIAGNOSIS — Z8679 Personal history of other diseases of the circulatory system: Secondary | ICD-10-CM

## 2017-08-10 DIAGNOSIS — R002 Palpitations: Secondary | ICD-10-CM | POA: Diagnosis not present

## 2017-08-10 DIAGNOSIS — I10 Essential (primary) hypertension: Secondary | ICD-10-CM

## 2017-08-10 MED ORDER — LISINOPRIL 5 MG PO TABS: 5.0000 mg | ORAL_TABLET | Freq: Every day | ORAL | 3 refills | Status: DC

## 2017-08-10 NOTE — Progress Notes (Signed)
Clinical Summary Juan Lopez is a 36 y.o.male seen today for follow up of the following medical problems.    1. History of myopericarditis - admission Jan 2018 with chest pain. Troponin up to 2.4, EKG suspicious for pericarditis - cath showed no significant CAD - discharged on ibuprofen 400mg  tid x 2 weeks, and colchcine 0.6mg  bid x 3 months.  - echo Jan 2018 LVEF 55-60%, no WMAs, no effusion.   - just occasional nonspecific chest pain.    2. Palpitations - we changed lopressor to dilt due to fatigue.  - no recent palpitations.    3. HTN Home bps 140s-170s/90-111 recently  - no heavy NSAIDs, no significant EtoH.  - he had stopped his bp meds for about 1 month. Back on x 2 weeks.    Past Medical History:  Diagnosis Date  . Acute myopericarditis    a. dx 10/2016  . CAD (coronary artery disease)    a. LHC 10/2016 with mild luminal irregularities. Placed on statin   . Family history of early CAD   . HTN (hypertension)      Allergies  Allergen Reactions  . Bee Venom Nausea And Vomiting     Current Outpatient Medications  Medication Sig Dispense Refill  . acetaminophen (TYLENOL) 325 MG tablet Take 325-650 mg by mouth every 6 (six) hours as needed for moderate pain. For pain    . atorvastatin (LIPITOR) 40 MG tablet Take 1 tablet (40 mg total) by mouth daily at 6 PM. 30 tablet 6  . colchicine 0.6 MG tablet Take 1 tablet (0.6 mg total) by mouth daily. 30 tablet 3  . diltiazem (CARDIZEM) 30 MG tablet TAKE 1 TABLET (30 MG TOTAL) BY MOUTH 2 (TWO) TIMES DAILY. 60 tablet 3  . ketoconazole (NIZORAL) 2 % cream Apply 1 application topically 2 (two) times daily.    Marland Kitchen lisinopril (PRINIVIL,ZESTRIL) 2.5 MG tablet TAKE 1 TABLET (2.5 MG TOTAL) BY MOUTH DAILY. 90 tablet 3  . nitroGLYCERIN (NITROSTAT) 0.4 MG SL tablet Place 1 tablet (0.4 mg total) under the tongue every 5 (five) minutes x 3 doses as needed for chest pain. 25 tablet 12  . potassium chloride (K-DUR) 10 MEQ tablet Take  1 tablet (10 mEq total) by mouth daily. 30 tablet 6  . ranitidine (ZANTAC) 150 MG tablet Take 1 tablet (150 mg total) by mouth daily. 90 tablet 3   No current facility-administered medications for this visit.      Past Surgical History:  Procedure Laterality Date  . TONSILLECTOMY       Allergies  Allergen Reactions  . Bee Venom Nausea And Vomiting      Family History  Problem Relation Age of Onset  . Diabetes Mother   . Heart failure Mother   . Heart failure Father   . Heart attack Maternal Grandmother   . Pneumonia Maternal Grandfather   . Diabetes Paternal Grandmother   . Obesity Paternal Grandmother   . Heart attack Paternal Grandfather      Social History Juan Lopez reports that he quit smoking about 22 months ago. He smoked 0.50 packs per day. he has never used smokeless tobacco. Juan Lopez reports that he drinks alcohol.   Review of Systems CONSTITUTIONAL: No weight loss, fever, chills, weakness or fatigue.  HEENT: Eyes: No visual loss, blurred vision, double vision or yellow sclerae.No hearing loss, sneezing, congestion, runny nose or sore throat.  SKIN: No rash or itching.  CARDIOVASCULAR: per hpi RESPIRATORY: No shortness of  breath, cough or sputum.  GASTROINTESTINAL: No anorexia, nausea, vomiting or diarrhea. No abdominal pain or blood.  GENITOURINARY: No burning on urination, no polyuria NEUROLOGICAL: No headache, dizziness, syncope, paralysis, ataxia, numbness or tingling in the extremities. No change in bowel or bladder control.  MUSCULOSKELETAL: No muscle, back pain, joint pain or stiffness.  LYMPHATICS: No enlarged nodes. No history of splenectomy.  PSYCHIATRIC: No history of depression or anxiety.  ENDOCRINOLOGIC: No reports of sweating, cold or heat intolerance. No polyuria or polydipsia.  Marland Kitchen   Physical Examination Vitals:   08/10/17 1105  BP: (!) 140/98  Pulse: 97  SpO2: 97%   Vitals:   08/10/17 1105  Weight: 285 lb (129.3 kg)  Height:  6' (1.829 m)    Gen: resting comfortably, no acute distress HEENT: no scleral icterus, pupils equal round and reactive, no palptable cervical adenopathy,  CV: RRR, no m/rg, no jvd Resp: Clear to auscultation bilaterally GI: abdomen is soft, non-tender, non-distended, normal bowel sounds, no hepatosplenomegaly MSK: extremities are warm, no edema.  Skin: warm, no rash Neuro:  no focal deficits Psych: appropriate affect   Diagnostic Studies     Assessment and Plan    1. HIstory of myopericarditis - resolved - continue to monitor   2. Palpitations - symptoms controlled, continue dilt  3. HTN - above goal, we will increase lisinopril to 5mg  daily - given info for DASH diet.  - bp log x 1 week   F/u 1 year. Request pcp labs    Arnoldo Lenis, M.D.

## 2017-08-10 NOTE — Patient Instructions (Signed)
Medication Instructions:  Increase lisinopril to 5 mg daily   Labwork: I will request labs form PCP.   Testing/Procedures: NONE  Follow-Up: Your physician wants you to follow-up in: 1 YEAR.  You will receive a reminder letter in the mail two months in advance. If you don't receive a letter, please call our office to schedule the follow-up appointment.   Any Other Special Instructions Will Be Listed Below (If Applicable).     If you need a refill on your cardiac medications before your next appointment, please call your pharmacy.    DASH Eating Plan DASH stands for "Dietary Approaches to Stop Hypertension." The DASH eating plan is a healthy eating plan that has been shown to reduce high blood pressure (hypertension). It may also reduce your risk for type 2 diabetes, heart disease, and stroke. The DASH eating plan may also help with weight loss. What are tips for following this plan? General guidelines  Avoid eating more than 2,300 mg (milligrams) of salt (sodium) a day. If you have hypertension, you may need to reduce your sodium intake to 1,500 mg a day.  Limit alcohol intake to no more than 1 drink a day for nonpregnant women and 2 drinks a day for men. One drink equals 12 oz of beer, 5 oz of wine, or 1 oz of hard liquor.  Work with your health care provider to maintain a healthy body weight or to lose weight. Ask what an ideal weight is for you.  Get at least 30 minutes of exercise that causes your heart to beat faster (aerobic exercise) most days of the week. Activities may include walking, swimming, or biking.  Work with your health care provider or diet and nutrition specialist (dietitian) to adjust your eating plan to your individual calorie needs. Reading food labels  Check food labels for the amount of sodium per serving. Choose foods with less than 5 percent of the Daily Value of sodium. Generally, foods with less than 300 mg of sodium per serving fit into this eating  plan.  To find whole grains, look for the word "whole" as the first word in the ingredient list. Shopping  Buy products labeled as "low-sodium" or "no salt added."  Buy fresh foods. Avoid canned foods and premade or frozen meals. Cooking  Avoid adding salt when cooking. Use salt-free seasonings or herbs instead of table salt or sea salt. Check with your health care provider or pharmacist before using salt substitutes.  Do not fry foods. Cook foods using healthy methods such as baking, boiling, grilling, and broiling instead.  Cook with heart-healthy oils, such as olive, canola, soybean, or sunflower oil. Meal planning   Eat a balanced diet that includes: ? 5 or more servings of fruits and vegetables each day. At each meal, try to fill half of your plate with fruits and vegetables. ? Up to 6-8 servings of whole grains each day. ? Less than 6 oz of lean meat, poultry, or fish each day. A 3-oz serving of meat is about the same size as a deck of cards. One egg equals 1 oz. ? 2 servings of low-fat dairy each day. ? A serving of nuts, seeds, or beans 5 times each week. ? Heart-healthy fats. Healthy fats called Omega-3 fatty acids are found in foods such as flaxseeds and coldwater fish, like sardines, salmon, and mackerel.  Limit how much you eat of the following: ? Canned or prepackaged foods. ? Food that is high in trans fat, such as  fried foods. ? Food that is high in saturated fat, such as fatty meat. ? Sweets, desserts, sugary drinks, and other foods with added sugar. ? Full-fat dairy products.  Do not salt foods before eating.  Try to eat at least 2 vegetarian meals each week.  Eat more home-cooked food and less restaurant, buffet, and fast food.  When eating at a restaurant, ask that your food be prepared with less salt or no salt, if possible. What foods are recommended? The items listed may not be a complete list. Talk with your dietitian about what dietary choices are best  for you. Grains Whole-grain or whole-wheat bread. Whole-grain or whole-wheat pasta. Brown rice. [place]. Bulgur. Whole-grain and low-sodium cereals. Pita bread. Low-fat, low-sodium crackers. Whole-wheat flour tortillas. Vegetables Fresh or frozen vegetables (raw, steamed, roasted, or grilled). Low-sodium or reduced-sodium tomato and vegetable juice. Low-sodium or reduced-sodium tomato sauce and tomato paste. Low-sodium or reduced-sodium canned vegetables. Fruits All fresh, dried, or frozen fruit. Canned fruit in natural juice (without added sugar). Meat and other protein foods Skinless chicken or [place]. Ground chicken or [place]. Pork with fat trimmed off. Fish and seafood. Egg whites. Dried beans, peas, or lentils. Unsalted nuts, nut butters, and seeds. Unsalted canned beans. Lean cuts of beef with fat trimmed off. Low-sodium, lean deli meat. Dairy Low-fat (1%) or fat-free (skim) milk. Fat-free, low-fat, or reduced-fat cheeses. Nonfat, low-sodium ricotta or cottage cheese. Low-fat or nonfat yogurt. Low-fat, low-sodium cheese. Fats and oils Soft margarine without trans fats. Vegetable oil. Low-fat, reduced-fat, or light mayonnaise and salad dressings (reduced-sodium). Canola, safflower, olive, soybean, and sunflower oils. Avocado. Seasoning and other foods Herbs. Spices. Seasoning mixes without salt. Unsalted popcorn and pretzels. Fat-free sweets. What foods are not recommended? The items listed may not be a complete list. Talk with your dietitian about what dietary choices are best for you. Grains Baked goods made with fat, such as croissants, muffins, or some breads. Dry pasta or rice meal packs. Vegetables Creamed or fried vegetables. Vegetables in a cheese sauce. Regular canned vegetables (not low-sodium or reduced-sodium). Regular canned tomato sauce and paste (not low-sodium or reduced-sodium). Regular tomato and vegetable juice (not low-sodium or reduced-sodium). Angie Fava.  Olives. Fruits Canned fruit in a light or heavy syrup. Fried fruit. Fruit in cream or butter sauce. Meat and other protein foods Fatty cuts of meat. Ribs. Fried meat. Berniece Salines. Sausage. Bologna and other processed lunch meats. Salami. Fatback. Hotdogs. Bratwurst. Salted nuts and seeds. Canned beans with added salt. Canned or smoked fish. Whole eggs or egg yolks. Chicken or [place] with skin. Dairy Whole or 2% milk, cream, and half-and-half. Whole or full-fat cream cheese. Whole-fat or sweetened yogurt. Full-fat cheese. Nondairy creamers. Whipped toppings. Processed cheese and cheese spreads. Fats and oils Butter. Stick margarine. Lard. Shortening. Ghee. Bacon fat. Tropical oils, such as coconut, palm kernel, or palm oil. Seasoning and other foods Salted popcorn and pretzels. Onion salt, garlic salt, seasoned salt, table salt, and sea salt. Worcestershire sauce. Tartar sauce. Barbecue sauce. Teriyaki sauce. Soy sauce, including reduced-sodium. Steak sauce. Canned and packaged gravies. Fish sauce. Oyster sauce. Cocktail sauce. Horseradish that you find on the shelf. Ketchup. Mustard. Meat flavorings and tenderizers. Bouillon cubes. Hot sauce and Tabasco sauce. Premade or packaged marinades. Premade or packaged taco seasonings. Relishes. Regular salad dressings. Where to find more information:  National Heart, Lung, and [place]: https://wilson-eaton.com/  American Heart Association: www.heart.org Summary  The DASH eating plan is a healthy eating plan that has been shown to  reduce high blood pressure (hypertension). It may also reduce your risk for type 2 diabetes, heart disease, and stroke.  With the DASH eating plan, you should limit salt (sodium) intake to 2,300 mg a day. If you have hypertension, you may need to reduce your sodium intake to 1,500 mg a day.  When on the DASH eating plan, aim to eat more fresh fruits and vegetables, whole grains, lean proteins, low-fat dairy, and heart-healthy  fats.  Work with your health care provider or diet and nutrition specialist (dietitian) to adjust your eating plan to your individual calorie needs. This information is not intended to replace advice given to you by your health care provider. Make sure you discuss any questions you have with your health care provider. Document Released: 09/09/2011 Document Revised: 09/13/2016 Document Reviewed: 09/13/2016 Elsevier Interactive Patient Education  2017 [employer].

## 2017-08-12 ENCOUNTER — Encounter: Payer: Self-pay | Admitting: Cardiology

## 2017-08-15 DIAGNOSIS — D2339 Other benign neoplasm of skin of other parts of face: Secondary | ICD-10-CM | POA: Diagnosis not present

## 2017-08-15 DIAGNOSIS — D225 Melanocytic nevi of trunk: Secondary | ICD-10-CM | POA: Diagnosis not present

## 2017-08-15 DIAGNOSIS — D2239 Melanocytic nevi of other parts of face: Secondary | ICD-10-CM | POA: Diagnosis not present

## 2017-08-15 DIAGNOSIS — L728 Other follicular cysts of the skin and subcutaneous tissue: Secondary | ICD-10-CM | POA: Diagnosis not present

## 2017-08-27 ENCOUNTER — Encounter (HOSPITAL_COMMUNITY): Payer: Self-pay | Admitting: Emergency Medicine

## 2017-08-27 ENCOUNTER — Other Ambulatory Visit: Payer: Self-pay

## 2017-08-27 DIAGNOSIS — I2510 Atherosclerotic heart disease of native coronary artery without angina pectoris: Secondary | ICD-10-CM | POA: Diagnosis not present

## 2017-08-27 DIAGNOSIS — Z79899 Other long term (current) drug therapy: Secondary | ICD-10-CM | POA: Insufficient documentation

## 2017-08-27 DIAGNOSIS — I252 Old myocardial infarction: Secondary | ICD-10-CM | POA: Diagnosis not present

## 2017-08-27 DIAGNOSIS — R1030 Lower abdominal pain, unspecified: Secondary | ICD-10-CM | POA: Diagnosis present

## 2017-08-27 DIAGNOSIS — R109 Unspecified abdominal pain: Secondary | ICD-10-CM | POA: Diagnosis not present

## 2017-08-27 DIAGNOSIS — Z87891 Personal history of nicotine dependence: Secondary | ICD-10-CM | POA: Insufficient documentation

## 2017-08-27 DIAGNOSIS — K5732 Diverticulitis of large intestine without perforation or abscess without bleeding: Secondary | ICD-10-CM | POA: Insufficient documentation

## 2017-08-27 DIAGNOSIS — I10 Essential (primary) hypertension: Secondary | ICD-10-CM | POA: Diagnosis not present

## 2017-08-27 NOTE — ED Triage Notes (Signed)
Lower abd pain over a week.  States he took milk of mag x 2, he had some bm after that were liquid.  Also reports nausea after eating.

## 2017-08-28 ENCOUNTER — Emergency Department (HOSPITAL_COMMUNITY)
Admission: EM | Admit: 2017-08-28 | Discharge: 2017-08-28 | Disposition: A | Discharge status: Home / Self Care | Payer: 59 | Attending: Emergency Medicine | Admitting: Emergency Medicine

## 2017-08-28 ENCOUNTER — Emergency Department (HOSPITAL_COMMUNITY): Payer: 59

## 2017-08-28 DIAGNOSIS — K5732 Diverticulitis of large intestine without perforation or abscess without bleeding: Secondary | ICD-10-CM

## 2017-08-28 DIAGNOSIS — R109 Unspecified abdominal pain: Secondary | ICD-10-CM | POA: Diagnosis not present

## 2017-08-28 LAB — CBC
HEMATOCRIT: 51.8 % (ref 39.0–52.0)
Hemoglobin: 16.9 g/dL (ref 13.0–17.0)
MCH: 30.7 pg (ref 26.0–34.0)
MCHC: 32.6 g/dL (ref 30.0–36.0)
MCV: 94 fL (ref 78.0–100.0)
Platelets: 310 10*3/uL (ref 150–400)
RBC: 5.51 MIL/uL (ref 4.22–5.81)
RDW: 13 % (ref 11.5–15.5)
WBC: 14 10*3/uL — ABNORMAL HIGH (ref 4.0–10.5)

## 2017-08-28 LAB — URINALYSIS, ROUTINE W REFLEX MICROSCOPIC
BILIRUBIN URINE: NEGATIVE
Glucose, UA: NEGATIVE mg/dL
HGB URINE DIPSTICK: NEGATIVE
KETONES UR: NEGATIVE mg/dL
Leukocytes, UA: NEGATIVE
NITRITE: NEGATIVE
Protein, ur: NEGATIVE mg/dL
Specific Gravity, Urine: 1.028 (ref 1.005–1.030)
pH: 5 (ref 5.0–8.0)

## 2017-08-28 LAB — COMPREHENSIVE METABOLIC PANEL
ALBUMIN: 4.2 g/dL (ref 3.5–5.0)
ALK PHOS: 105 U/L (ref 38–126)
ALT: 44 U/L (ref 17–63)
AST: 24 U/L (ref 15–41)
Anion gap: 8 (ref 5–15)
BILIRUBIN TOTAL: 0.3 mg/dL (ref 0.3–1.2)
BUN: 13 mg/dL (ref 6–20)
CALCIUM: 9.3 mg/dL (ref 8.9–10.3)
CO2: 27 mmol/L (ref 22–32)
Chloride: 101 mmol/L (ref 101–111)
Creatinine, Ser: 0.86 mg/dL (ref 0.61–1.24)
GFR calc Af Amer: >60 mL/min (ref >60)
GFR calc non Af Amer: >60 mL/min (ref >60)
GLUCOSE: 102 mg/dL — AB (ref 65–99)
Potassium: 3.7 mmol/L (ref 3.5–5.1)
Sodium: 136 mmol/L (ref 135–145)
TOTAL PROTEIN: 7.5 g/dL (ref 6.5–8.1)

## 2017-08-28 LAB — LIPASE, BLOOD: Lipase: 24 U/L (ref 11–51)

## 2017-08-28 MED ORDER — METRONIDAZOLE 500 MG PO TABS: 500.0000 mg | ORAL_TABLET | Freq: Three times a day (TID) | ORAL | 0 refills | Status: DC

## 2017-08-28 MED ORDER — METRONIDAZOLE IN NACL 5-0.79 MG/ML-% IV SOLN
500.0000 mg | Freq: Once | INTRAVENOUS | Status: AC
  Administered 2017-08-28: 500 mg via INTRAVENOUS
  Filled 2017-08-28: qty 100

## 2017-08-28 MED ORDER — IOPAMIDOL (ISOVUE-300) INJECTION 61%
100.0000 mL | Freq: Once | INTRAVENOUS | Status: AC | PRN
  Administered 2017-08-28: 100 mL via INTRAVENOUS

## 2017-08-28 MED ORDER — CIPROFLOXACIN HCL 500 MG PO TABS: 500.0000 mg | ORAL_TABLET | Freq: Two times a day (BID) | ORAL | 0 refills | Status: DC

## 2017-08-28 MED ORDER — SODIUM CHLORIDE 0.9 % IV BOLUS (SEPSIS)
1000.0000 mL | Freq: Once | INTRAVENOUS | Status: AC
  Administered 2017-08-28: 1000 mL via INTRAVENOUS

## 2017-08-28 MED ORDER — CIPROFLOXACIN IN D5W 400 MG/200ML IV SOLN
400.0000 mg | Freq: Once | INTRAVENOUS | Status: AC
  Administered 2017-08-28: 400 mg via INTRAVENOUS
  Filled 2017-08-28: qty 200

## 2017-08-28 NOTE — ED Notes (Signed)
Pt finishing IV medications. Will d/c upon completion.

## 2017-08-28 NOTE — ED Provider Notes (Signed)
[facility] EMERGENCY DEPARTMENT Provider Note   CSN: [id number] Arrival date & time: 08/27/17  2200     History   Chief Complaint Chief Complaint  Patient presents with  . Abdominal Pain    HPI Juan Lopez is a 36 y.o. male.  The history is provided by the patient.  He has a history of myopericarditis and hypertension.  For the last 10 days, he has had some crampy lower abdominal pain and has been passing mucousy stools.  He denies any blood in the stool.  He states he is not had what he considers to be a normal bowel movement.  However, he is passing mucus every 1-2 hours there has been some occasional, mild nausea.  Denies fever or chills.  He has taken milk of magnesia which changes the stool consistency to water, but then it returns to the mucus and symptoms recur.  Of note, he had been on colchicine when he was diagnosed with myopericarditis, but has been off of that since February.  He states that when his cramping pain is at its worst, he rates it at 10/10.  He also does relate a constant sense of rectal urgency, and stress fecal incontinence.  Past Medical History:  Diagnosis Date  . Acute myopericarditis    a. dx 10/2016  . CAD (coronary artery disease)    a. LHC 10/2016 with mild luminal irregularities. Placed on statin   . Family history of early CAD   . HTN (hypertension)     Patient Active Problem List   Diagnosis Date Noted  . Coronary artery disease involving native coronary artery of native heart without angina pectoris   . NSTEMI (non-ST elevated myocardial infarction) ([place]) 10/16/2016  . Acute myopericarditis 10/16/2016  . Essential hypertension 10/16/2016  . Family history of early CAD 10/16/2016    Past Surgical History:  Procedure Laterality Date  . CARDIAC CATHETERIZATION [name] 10/16/2016   Procedure: Left Heart Cath and Coronary Angiography;  Surgeon: Peter M Martinique, MD;  Location: [facility];  Service: Cardiovascular;  Laterality: [name];  .  TONSILLECTOMY         Home Medications    Prior to Admission medications   Medication Sig Start Date End Date Taking? Authorizing Provider  acetaminophen (TYLENOL) 325 MG tablet Take 325-650 mg by mouth every 6 (six) hours as needed for moderate pain. For pain    [provider]  diltiazem (CARDIZEM) 30 MG tablet TAKE 1 TABLET (30 MG TOTAL) BY MOUTH 2 (TWO) TIMES DAILY. 03/21/17   Arnoldo Lenis, MD  lisinopril (PRINIVIL,ZESTRIL) 5 MG tablet Take 1 tablet (5 mg total) daily by mouth. 08/10/17 11/08/17  Arnoldo Lenis, MD  nitroGLYCERIN (NITROSTAT) 0.4 MG SL tablet Place 1 tablet (0.4 mg total) under the tongue every 5 (five) minutes x 3 doses as needed for chest pain. 10/17/16   Eileen Stanford, PA-C    Family History Family History  Problem Relation Age of Onset  . Diabetes Mother   . Heart failure Mother   . Heart failure Father   . Heart attack Maternal Grandmother   . Pneumonia Maternal Grandfather   . Diabetes Paternal Grandmother   . Obesity Paternal Grandmother   . Heart attack Paternal Grandfather     Social History Social History   Tobacco Use  . Smoking status: Former Smoker    Packs/day: 0.50    Last attempt to quit: 09/26/2015    Years since quitting: 1.9  . Smokeless  tobacco: Never Used  Substance Use Topics  . Alcohol use: Yes    Comment: occ  . Drug use: No     Allergies   Bee venom   Review of Systems Review of Systems  All other systems reviewed and are negative.    Physical Exam Updated Vital Signs BP 137/81 (BP Location: Right Arm)   Pulse (!) 112   Temp 99.1 F (37.3 C) (Oral)   Resp 18   Ht 6' (1.829 m)   Wt 126.1 kg (278 lb)   SpO2 96%   BMI 37.70 kg/m   Physical Exam  Nursing note and vitals reviewed.  36 year old male, resting comfortably and in no acute distress. Vital signs are significant for tachycardia. Oxygen saturation is 96%, which is normal. Head is normocephalic and atraumatic. PERRLA, EOMI.  Oropharynx is clear. Neck is nontender and supple without adenopathy or JVD. Back is nontender and there is no CVA tenderness. Lungs are clear without rales, wheezes, or rhonchi. Chest is nontender. Heart has regular rate and rhythm without murmur. Abdomen is soft, flat, with mild tenderness across the suprapubic area.  There is no rebound or guarding.  There are no masses or hepatosplenomegaly and peristalsis is hypoactive. Extremities have no cyanosis or edema, full range of motion is present. Skin is warm and dry without rash. Neurologic: Mental status is normal, cranial nerves are intact, there are no motor or sensory deficits.  ED Treatments / Results  Labs (all labs ordered are listed, but only abnormal results are displayed) Labs Reviewed  COMPREHENSIVE METABOLIC PANEL - Abnormal; Notable for the following components:      Result Value   Glucose, Bld 102 (*)    All other components within normal limits  CBC - Abnormal; Notable for the following components:   WBC 14.0 (*)    All other components within normal limits  LIPASE, BLOOD  URINALYSIS, ROUTINE W REFLEX MICROSCOPIC    Radiology Ct Abdomen Pelvis W Contrast  Result Date: 08/28/2017 CLINICAL DATA:  Lower abdominal pain for 1 week EXAM: CT ABDOMEN AND PELVIS WITH CONTRAST TECHNIQUE: Multidetector CT imaging of the abdomen and pelvis was performed using the standard protocol following bolus administration of intravenous contrast. CONTRAST:  128mL ISOVUE-300 IOPAMIDOL (ISOVUE-300) INJECTION 61% COMPARISON:  None. FINDINGS: Lower chest: No pulmonary nodules or pleural effusion. No visible pericardial effusion. Hepatobiliary: Normal hepatic contours and density. No visible biliary dilatation. Normal gallbladder. Pancreas: Normal contours without ductal dilatation. No peripancreatic fluid collection. Spleen: Normal. Adrenals/Urinary Tract: --Adrenal glands: Normal. --Right kidney/ureter: No hydronephrosis or perinephric stranding.  No nephrolithiasis. No obstructing ureteral stones. --Left kidney/ureter: No hydronephrosis or perinephric stranding. No nephrolithiasis. No obstructing ureteral stones. --Urinary bladder: Unremarkable. Stomach/Bowel: --Stomach/Duodenum: No hiatal hernia or other gastric abnormality. Normal duodenal course and caliber. --Small bowel: No dilatation or inflammation. --Colon: There is wall thickening of the distal sigmoid colon and surrounding inflammatory stranding. There are multiple sigmoid colonic diverticula. Small fluid collection adjacent to the superior aspect of the sigmoid colon measures 2.2 x 2.2 cm (series 5, image 39). --Appendix: Normal. Vascular/Lymphatic: Normal course and caliber of the major abdominal vessels. No abdominal or pelvic lymphadenopathy. Reproductive: No free fluid in the pelvis. Musculoskeletal. No bony spinal canal stenosis or focal osseous abnormality. Other: None. IMPRESSION: 1. Acute rectosigmoid diverticulitis/colitis. 2. 2.2 cm fluid collection along the dorsal aspect of the sigmoid colon is probably an inflamed diverticulum. While a developing a small pericolonic abscess might have a similar appearance, this is less  likely. Electronically Signed   By: Ulyses Jarred M.D.   On: 08/28/2017 03:15    Procedures Procedures   Medications Ordered in ED Medications  ciprofloxacin (CIPRO) IVPB 400 mg (not administered)  metroNIDAZOLE (FLAGYL) IVPB 500 mg (not administered)  sodium chloride 0.9 % bolus 1,000 mL (1,000 mLs Intravenous New Bag/Given 08/28/17 0251)  iopamidol (ISOVUE-300) 61 % injection 100 mL (100 mLs Intravenous Contrast Given 08/28/17 0236)   Initial Impression / Assessment and Plan / ED Course  I have reviewed the triage vital signs and the nursing notes.  Pertinent labs & imaging results that were available during my care of the patient were reviewed by me and considered in my medical decision making (see chart for details).  Abdominal pain with abnormal  stool.  Consider possibility of inflammatory bowel disease.  Possible infectious colitis.  Old records are reviewed confirming cardiology treatment for myopericarditis.  Laboratory workup shows mild to moderate leukocytosis.  Will send for CT of abdomen and pelvis.  CT scan shows evidence of sigmoid diverticulitis.  He is given a dose of ciprofloxacin and metronidazole in the ED and is discharged with prescriptions for same.  Follow-up with PCP.  Return precautions discussed.  Final Clinical Impressions(s) / ED Diagnoses   Final diagnoses:  Diverticulitis of sigmoid colon    ED Discharge Orders        Ordered    ciprofloxacin (CIPRO) 500 MG tablet  2 times daily     08/28/17 0621    metroNIDAZOLE (FLAGYL) 500 MG tablet  3 times daily     75/64/33 2951       Delora Fuel, MD 88/41/66 [phone]

## 2017-08-28 NOTE — Discharge Instructions (Signed)
Return if you start running a fever, abdominal pain is getting worse, or if you start vomiting.  Do not drink anything with alcohol in it while you are taking metronidazole. If you do, you will get very sick.

## 2017-09-05 DIAGNOSIS — K5792 Diverticulitis of intestine, part unspecified, without perforation or abscess without bleeding: Secondary | ICD-10-CM | POA: Diagnosis not present

## 2017-09-05 DIAGNOSIS — K5732 Diverticulitis of large intestine without perforation or abscess without bleeding: Secondary | ICD-10-CM | POA: Diagnosis not present

## 2017-09-22 ENCOUNTER — Encounter: Payer: Self-pay | Admitting: Gastroenterology

## 2017-11-02 ENCOUNTER — Telehealth: Payer: Self-pay | Admitting: *Deleted

## 2017-11-02 ENCOUNTER — Other Ambulatory Visit: Payer: Self-pay | Admitting: *Deleted

## 2017-11-02 ENCOUNTER — Encounter: Payer: Self-pay | Admitting: *Deleted

## 2017-11-02 ENCOUNTER — Encounter: Payer: Self-pay | Admitting: Gastroenterology

## 2017-11-02 ENCOUNTER — Ambulatory Visit: Payer: 59 | Admitting: Gastroenterology

## 2017-11-02 DIAGNOSIS — R194 Change in bowel habit: Secondary | ICD-10-CM

## 2017-11-02 MED ORDER — NA SULFATE-K SULFATE-MG SULF 17.5-3.13-1.6 GM/177ML PO SOLN: 1.0000 | ORAL | 0 refills | Status: DC

## 2017-11-02 NOTE — Patient Instructions (Addendum)
Complete colonoscopy Feb 11.  DRINK WATER TO KEEP YOUR URINE LIGHT YELLOW.  FOLLOW A HIGH FIBER DIET. AVOID ITEMS THAT CAUSE BLOATING & GAS. SEE INFO BELOW.  TAKE COLACE ONE OR TWO DAILY TO KEEP STOOL SOFT.   FOLLOW UP IN THE OFFICE WILL BE SCHEDULED IF NEEDED AFTER ENDOSCOPY.    High-Fiber Diet A high-fiber diet changes your normal diet to include more whole grains, legumes, fruits, and vegetables. Changes in the diet involve replacing refined carbohydrates with unrefined foods. The calorie level of the diet is essentially unchanged. The Dietary Reference Intake (recommended amount) for adult males is 38 grams per day. For adult females, it is 25 grams per day. Pregnant and lactating women should consume 28 grams of fiber per day.Fiber is the intact part of a plant that is not broken down during digestion. Functional fiber is fiber that has been isolated from the plant to provide a beneficial effect in the body.  PURPOSE  Increase stool bulk.   Ease and regulate bowel movements.   Lower cholesterol.   REDUCE RISK OF COLON CANCER  INDICATIONS THAT YOU NEED MORE FIBER  Constipation and hemorrhoids.   Uncomplicated diverticulosis (intestine condition) and irritable bowel syndrome.   Weight management.   As a protective measure against hardening of the arteries (atherosclerosis), diabetes, and cancer.   GUIDELINES FOR INCREASING FIBER IN THE DIET  Start adding fiber to the diet slowly. A gradual increase of about 5 more grams (2 slices of whole-wheat bread, 2 servings of most fruits or vegetables, or 1 bowl of high-fiber cereal) per day is best. Too rapid an increase in fiber may result in constipation, flatulence, and bloating.   Drink enough water and fluids to keep your urine clear or pale yellow. Water, juice, or caffeine-free drinks are recommended. Not drinking enough fluid may cause constipation.   Eat a variety of high-fiber foods rather than one type of fiber.   Try  to increase your intake of fiber through using high-fiber foods rather than fiber pills or supplements that contain small amounts of fiber.   The goal is to change the types of food eaten. Do not supplement your present diet with high-fiber foods, but replace foods in your present diet.  INCLUDE A VARIETY OF FIBER SOURCES  Replace refined and processed grains with whole grains, canned fruits with fresh fruits, and incorporate other fiber sources. White rice, white breads, and most bakery goods contain little or no fiber.   Brown whole-grain rice, buckwheat oats, and many fruits and vegetables are all good sources of fiber. These include: broccoli, Brussels sprouts, cabbage, cauliflower, beets, sweet potatoes, white potatoes (skin on), carrots, tomatoes, eggplant, squash, berries, fresh fruits, and dried fruits.   Cereals appear to be the richest source of fiber. Cereal fiber is found in whole grains and bran. Bran is the fiber-rich outer coat of cereal grain, which is largely removed in refining. In whole-grain cereals, the bran remains. In breakfast cereals, the largest amount of fiber is found in those with "bran" in their names. The fiber content is sometimes indicated on the label.   You may need to include additional fruits and vegetables each day.   In baking, for 1 cup white flour, you may use the following substitutions:   1 cup whole-wheat flour minus 2 tablespoons.   1/2 cup white flour plus 1/2 cup whole-wheat flour.

## 2017-11-02 NOTE — Progress Notes (Addendum)
Subjective:    Patient ID: Juan Lopez, male    DOB: Apr 05, 1981, 37 y.o.   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 AFTER 2 WEEKS GOT TO HURTING(STABBING ONMID-LOWER ABDOMEN) SO WIFE MADE HIM GO TO THE ED. NO TRIGGERS. Had stomach issues since he was a little kid. Mother had arts colon removed. Sister had surgery on her colon. Never had colonoscopy. BEFORE TICITIS STRAIGHT LIQUID AND NOW #1. BMs: ALL THE TIME FEELS NEEDS [place] OR SOFT. WOULD RATHER HAVE A #4 BUT COMES OUT LIKE ALMOST #5.  ASSOCIATED WITH PAIN THAT LAST FOR SECS TO HOUR. THROBBING AND MAY BE SO SEVERE HE HAS TO STOP WHAT HE'S DOING. NEVER TRIED ANY MEDS. WORSE: NOTHING.  HAVING A BM DOESN'T SEEM TO MAKE IT BETTER. JUST GOES AWAY WITH TIME. RARE NAUSEA. PT DENIES FEVER, CHILLS, HEMATOCHEZIA, HEMATEMESIS, vomiting, melena, diarrhea, CHEST PAIN, SHORTNESS OF BREATH, problems swallowing, problems with sedation, OR heartburn or indigestion.  Past Medical History:  Diagnosis Date  . Acute myopericarditis    a. dx 10/2016  . CAD (coronary artery disease)    a. LHC 10/2016 with mild luminal irregularities. Placed on statin   . Family history of early CAD   . HTN (hypertension)    Past Surgical History:  Procedure Laterality Date  . CARDIAC CATHETERIZATION [name] 10/16/2016   Procedure: Left Heart Cath and Coronary Angiography;  Surgeon: Peter M Martinique, MD;  Location: [facility];  Service: Cardiovascular;  Laterality: [name];  . TONSILLECTOMY    . WISDOM TOOTH EXTRACTION     AGE [age]    Allergies  Allergen Reactions  . Bee Venom Nausea And Vomiting   Current Outpatient Medications  Medication Sig Dispense Refill  . acetaminophen (TYLENOL) 325 MG tablet Take 325-650 mg by mouth every 6 (six) hours as needed for moderate pain. For pain    . diltiazem (CARDIZEM) 30 MG tablet TAKE 1  TABLET (30 MG TOTAL) BY MOUTH 2 (TWO) TIMES DAILY.    Marland Kitchen lisinopril (PRINIVIL,ZESTRIL) 5 MG tablet Take 1 tablet (5 mg total) daily by mouth.    . nitroGLYCERIN (NITROSTAT) 0.4 MG SL tablet Place 1 tablet (0.4 mg total) under the tongue every 5 (five) minutes x 3 doses as needed for chest pain.    .      .       Family History  Problem Relation Age of Onset  . Diabetes Mother   . Heart failure Mother   . Colon polyps Mother        REQUIRED COLECTOMY  . Heart failure Father   . Heart attack Maternal Grandmother   . Pneumonia Maternal Grandfather   . Diabetes Paternal Grandmother   . Obesity Paternal Grandmother   . Heart attack Paternal Grandfather   . Colon cancer Neg Hx     Social History   Socioeconomic History  . Marital status: Married    Spouse name: None  . Number of children: None  . Years of education: None  . Highest education level: None  Social Needs  . Financial resource strain: None  . Food insecurity - worry: None  . Food insecurity - inability: None  . Transportation needs - medical: None  . Transportation needs - non-medical: None  Occupational History  . None  Tobacco Use  . Smoking status: Former Smoker  Packs/day: 0.50    Last attempt to quit: 09/26/2015    Years since quitting: 2.1  . Smokeless tobacco: Never Used  Substance and Sexual Activity  . Alcohol use: Yes    Comment: rare  . Drug use: No  . Sexual activity: None  Other Topics Concern  . 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 MARRIED: 2 YRS AND 15 YRS TOGETHER. NO CIGS OR ETOH.   Review of Systems PER HPI OTHERWISE ALL SYSTEMS ARE NEGATIVE.    Objective:   Physical Exam  Constitutional: He is oriented to person, place, and time. He appears well-developed and well-nourished. No distress.  HENT:  Head: Normocephalic and atraumatic.  Mouth/Throat: Oropharynx is clear and moist. No oropharyngeal exudate.  Eyes: Pupils are equal, round, and  reactive to light. No scleral icterus.  Neck: Normal range of motion. Neck supple.  Cardiovascular: Normal rate, regular rhythm and normal heart sounds.  Pulmonary/Chest: Effort normal and breath sounds normal. No respiratory distress.  Abdominal: Soft. Bowel sounds are normal. He exhibits no distension. There is no tenderness.  Musculoskeletal: He exhibits no edema.  Lymphadenopathy:    He has no cervical adenopathy.  Neurological: He is alert and oriented to person, place, and time.  NO FOCAL DEFICITS  Psychiatric: He has a normal mood and affect.  Vitals reviewed.     Assessment & Plan:

## 2017-11-02 NOTE — H&P (View-Only) (Signed)
Subjective:    Patient ID: Juan Lopez, male    DOB: 1981/06/26, 37 y.o.   MRN: [id number]  [employer], MD   HPI CHANGE IN BOWEL IN HABITS; STARTED LAST SUMMER HURTING AND THEN IT WENT AWAY AND THEN AT [employer] [facility] AN COULDN';T GO TO BATHROOM. AFTER 2 WEEKS GOT TO HURTING(STABBING ONMID-LOWER ABDOMEN) SO WIFE MADE HIM GO TO THE ED. NO TRIGGERS. Had stomach issues since he was a little kid. Mother had arts colon removed. Sister had surgery on her colon. Never had colonoscopy. BEFORE TICITIS STRAIGHT LIQUID AND NOW #1. BMs: ALL THE TIME FEELS NEEDS [place] OR SOFT. WOULD RATHER HAVE A #4 BUT COMES OUT LIKE ALMOST #5.  ASSOCIATED WITH PAIN THAT LAST FOR SECS TO HOUR. THROBBING AND MAY BE SO SEVERE HE HAS TO STOP WHAT HE'S DOING. NEVER TRIED ANY MEDS. WORSE: NOTHING.  HAVING A BM DOESN'T SEEM TO MAKE IT BETTER. JUST GOES AWAY WITH TIME. RARE NAUSEA. PT DENIES FEVER, CHILLS, HEMATOCHEZIA, HEMATEMESIS, vomiting, melena, diarrhea, CHEST PAIN, SHORTNESS OF BREATH, problems swallowing, problems with sedation, OR heartburn or indigestion.  Past Medical History:  Diagnosis Date  . Acute myopericarditis    a. dx 10/2016  . CAD (coronary artery disease)    a. LHC 10/2016 with mild luminal irregularities. Placed on statin   . Family history of early CAD   . HTN (hypertension)    Past Surgical History:  Procedure Laterality Date  . CARDIAC CATHETERIZATION [name] 10/16/2016   Procedure: Left Heart Cath and Coronary Angiography;  Surgeon: Peter M Martinique, MD;  Location: [facility];  Service: Cardiovascular;  Laterality: [name];  . TONSILLECTOMY    . WISDOM TOOTH EXTRACTION     AGE 60-25    Allergies  Allergen Reactions  . Bee Venom Nausea And Vomiting   Current Outpatient Medications  Medication Sig Dispense Refill  . acetaminophen (TYLENOL) 325 MG tablet Take 325-650 mg by mouth every 6 (six) hours as needed for moderate pain. For pain    . diltiazem (CARDIZEM) 30 MG tablet TAKE 1  TABLET (30 MG TOTAL) BY MOUTH 2 (TWO) TIMES DAILY.    Marland Kitchen lisinopril (PRINIVIL,ZESTRIL) 5 MG tablet Take 1 tablet (5 mg total) daily by mouth.    . nitroGLYCERIN (NITROSTAT) 0.4 MG SL tablet Place 1 tablet (0.4 mg total) under the tongue every 5 (five) minutes x 3 doses as needed for chest pain.    .      .       Family History  Problem Relation Age of Onset  . Diabetes Mother   . Heart failure Mother   . Colon polyps Mother        REQUIRED COLECTOMY  . Heart failure Father   . Heart attack Maternal Grandmother   . Pneumonia Maternal Grandfather   . Diabetes Paternal Grandmother   . Obesity Paternal Grandmother   . Heart attack Paternal Grandfather   . Colon cancer Neg Hx     Social History   Socioeconomic History  . Marital status: Married    Spouse name: None  . Number of children: None  . Years of education: None  . Highest education level: None  Social Needs  . Financial resource strain: None  . Food insecurity - worry: None  . Food insecurity - inability: None  . Transportation needs - medical: None  . Transportation needs - non-medical: None  Occupational History  . None  Tobacco Use  . Smoking status: Former Smoker  Packs/day: 0.50    Last attempt to quit: 09/26/2015    Years since quitting: 2.1  . Smokeless tobacco: Never Used  Substance and Sexual Activity  . Alcohol use: Yes    Comment: rare  . Drug use: No  . Sexual activity: None  Other Topics Concern  . 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 MARRIED: 2 YRS AND 15 YRS TOGETHER. NO CIGS OR ETOH.   Review of Systems PER HPI OTHERWISE ALL SYSTEMS ARE NEGATIVE.    Objective:   Physical Exam  Constitutional: He is oriented to person, place, and time. He appears well-developed and well-nourished. No distress.  HENT:  Head: Normocephalic and atraumatic.  Mouth/Throat: Oropharynx is clear and moist. No oropharyngeal exudate.  Eyes: Pupils are equal, round, and  reactive to light. No scleral icterus.  Neck: Normal range of motion. Neck supple.  Cardiovascular: Normal rate, regular rhythm and normal heart sounds.  Pulmonary/Chest: Effort normal and breath sounds normal. No respiratory distress.  Abdominal: Soft. Bowel sounds are normal. He exhibits no distension. There is no tenderness.  Musculoskeletal: He exhibits no edema.  Lymphadenopathy:    He has no cervical adenopathy.  Neurological: He is alert and oriented to person, place, and time.  NO FOCAL DEFICITS  Psychiatric: He has a normal mood and affect.  Vitals reviewed.     Assessment & Plan:

## 2017-11-02 NOTE — Telephone Encounter (Signed)
PA for TCS has been approved via [facility]. Auth #: [id number]

## 2017-11-02 NOTE — Assessment & Plan Note (Addendum)
PRECEDED BY ACUTE UNCOMPLICATED DIVERTICULITIS. MOST LIKELY DUE TO RESTRICTED MOBILITY OF THE COLON AND LESS LIKELY OBSTRUCTING COLON CANCER.  Complete colonoscopy Feb 11. DISCUSSED PROCEDURE, BENEFITS, & RISKS: < 1% chance of medication reaction, bleeding, perforation, or rupture of spleen/liver. I PERSONALLY REVIEWED THE CT WITH DR. Jamesetta Geralds DIVERTICULITIS, NL CBD DRINK WATER TO KEEP YOUR URINE LIGHT YELLOW. FOLLOW A HIGH FIBER DIET. AVOID ITEMS THAT CAUSE BLOATING & GAS. HANDOUT GIVEN. TAKE COLACE ONE OR TWO DAILY TO KEEP STOOL SOFT.  FOLLOW UP IN THE OFFICE WILL BE SCHEDULED IF NEEDED AFTER ENDOSCOPY.

## 2017-11-03 NOTE — Progress Notes (Signed)
cc'ed to pcp °

## 2017-11-14 ENCOUNTER — Encounter (HOSPITAL_COMMUNITY)
Admission: RE | Disposition: A | Discharge status: Home / Self Care | Payer: Self-pay | Source: Ambulatory Visit | Attending: Gastroenterology

## 2017-11-14 ENCOUNTER — Ambulatory Visit (HOSPITAL_COMMUNITY)
Admission: RE | Admit: 2017-11-14 | Discharge: 2017-11-14 | Disposition: A | Discharge status: Home / Self Care | Payer: 59 | Source: Ambulatory Visit | Attending: Gastroenterology | Admitting: Gastroenterology

## 2017-11-14 ENCOUNTER — Encounter (HOSPITAL_COMMUNITY): Payer: Self-pay

## 2017-11-14 ENCOUNTER — Other Ambulatory Visit: Payer: Self-pay

## 2017-11-14 DIAGNOSIS — I10 Essential (primary) hypertension: Secondary | ICD-10-CM | POA: Insufficient documentation

## 2017-11-14 DIAGNOSIS — Z9889 Other specified postprocedural states: Secondary | ICD-10-CM | POA: Insufficient documentation

## 2017-11-14 DIAGNOSIS — Z79899 Other long term (current) drug therapy: Secondary | ICD-10-CM | POA: Diagnosis not present

## 2017-11-14 DIAGNOSIS — K5730 Diverticulosis of large intestine without perforation or abscess without bleeding: Secondary | ICD-10-CM | POA: Insufficient documentation

## 2017-11-14 DIAGNOSIS — Z91030 Bee allergy status: Secondary | ICD-10-CM | POA: Diagnosis not present

## 2017-11-14 DIAGNOSIS — Z8249 Family history of ischemic heart disease and other diseases of the circulatory system: Secondary | ICD-10-CM | POA: Diagnosis not present

## 2017-11-14 DIAGNOSIS — Q438 Other specified congenital malformations of intestine: Secondary | ICD-10-CM | POA: Diagnosis not present

## 2017-11-14 DIAGNOSIS — R194 Change in bowel habit: Secondary | ICD-10-CM | POA: Diagnosis not present

## 2017-11-14 DIAGNOSIS — Z8371 Family history of colonic polyps: Secondary | ICD-10-CM | POA: Insufficient documentation

## 2017-11-14 DIAGNOSIS — I2510 Atherosclerotic heart disease of native coronary artery without angina pectoris: Secondary | ICD-10-CM | POA: Insufficient documentation

## 2017-11-14 DIAGNOSIS — D122 Benign neoplasm of ascending colon: Secondary | ICD-10-CM | POA: Diagnosis not present

## 2017-11-14 DIAGNOSIS — Z87891 Personal history of nicotine dependence: Secondary | ICD-10-CM | POA: Insufficient documentation

## 2017-11-14 DIAGNOSIS — K644 Residual hemorrhoidal skin tags: Secondary | ICD-10-CM | POA: Diagnosis not present

## 2017-11-14 DIAGNOSIS — K648 Other hemorrhoids: Secondary | ICD-10-CM | POA: Insufficient documentation

## 2017-11-14 DIAGNOSIS — Z833 Family history of diabetes mellitus: Secondary | ICD-10-CM | POA: Diagnosis not present

## 2017-11-14 HISTORY — PX: POLYPECTOMY: SHX5525

## 2017-11-14 HISTORY — PX: COLONOSCOPY: SHX5424

## 2017-11-14 SURGERY — COLONOSCOPY
Anesthesia: Moderate Sedation

## 2017-11-14 MED ORDER — SODIUM CHLORIDE 0.9 % IV SOLN
INTRAVENOUS | Status: DC
  Administered 2017-11-14: 07:00:00 via INTRAVENOUS

## 2017-11-14 MED ORDER — MEPERIDINE HCL 100 MG/ML IJ SOLN
INTRAMUSCULAR | Status: AC
  Filled 2017-11-14: qty 2

## 2017-11-14 MED ORDER — SIMETHICONE 40 MG/0.6ML PO SUSP
ORAL | Status: DC | PRN
  Administered 2017-11-14: 15 mL

## 2017-11-14 MED ORDER — MEPERIDINE HCL 100 MG/ML IJ SOLN
INTRAMUSCULAR | Status: DC | PRN
  Administered 2017-11-14 (×2): 25 mg via INTRAVENOUS
  Administered 2017-11-14: 50 mg via INTRAVENOUS

## 2017-11-14 MED ORDER — MIDAZOLAM HCL 5 MG/5ML IJ SOLN
INTRAMUSCULAR | Status: AC
  Filled 2017-11-14: qty 10

## 2017-11-14 MED ORDER — PROMETHAZINE HCL 25 MG/ML IJ SOLN
INTRAMUSCULAR | Status: DC | PRN
  Administered 2017-11-14: 12.5 mg via INTRAVENOUS

## 2017-11-14 MED ORDER — PROMETHAZINE HCL 25 MG/ML IJ SOLN
INTRAMUSCULAR | Status: AC
  Filled 2017-11-14: qty 1

## 2017-11-14 MED ORDER — SODIUM CHLORIDE 0.9% FLUSH
INTRAVENOUS | Status: AC
  Filled 2017-11-14: qty 10

## 2017-11-14 MED ORDER — MIDAZOLAM HCL 5 MG/5ML IJ SOLN
INTRAMUSCULAR | Status: DC | PRN
  Administered 2017-11-14 (×3): 2 mg via INTRAVENOUS

## 2017-11-14 NOTE — Discharge Instructions (Signed)
You had 1 small polyp removed. You have  MODERATE internal hemorrhoids.  I BIOPSIED YOUR COLON.   DRINK WATER TO KEEP YOUR URINE LIGHT YELLOW.  CONTINUE YOUR WEIGHT LOSS EFFORTS.  WHILE I DO NOT WANT TO ALARM YOU, YOUR BODY MASS INDEX IS OVER 30 WHICH MEANS YOU ARE OBESE. Obesity can ACTIVATE cancer genes. OBESITY IS ASSOCIATED WITH AN INCREASED RISK FOR CIRRHOSIS AND ALL CANCERS, INCLUDING ESOPHAGEAL AND COLON CANCER. A WEIGHT OF 220 LBS OR LESS  WILL KEEP YOUR BODY MASS INDEX(BMI) UNDER 30.  FOLLOW A HIGH FIBER DIET. AVOID ITEMS THAT CAUSE BLOATING & GAS. SEE INFO BELOW.  YOUR BIOPSY RESULTS WILL BE AVAILABLE IN MY CHART AFTER FEB 14 AND MY OFFICE WILL CONTACT YOU IN 10-14 DAYS WITH YOUR RESULTS.   Next colonoscopy in 5-10 years IF YOU HAVE A SIMPLE ADENOMA REMOVED.   Colonoscopy Care After Read the instructions outlined below and refer to this sheet in the next week. These discharge instructions provide you with general information on caring for yourself after you leave the hospital. While your treatment has been planned according to the most current medical practices available, unavoidable complications occasionally occur. If you have any problems or questions after discharge, call DR. FIELDS, [phone].  ACTIVITY  You may resume your regular activity, but move at a slower pace for the next 24 hours.   Take frequent rest periods for the next 24 hours.   Walking will help get rid of the air and reduce the bloated feeling in your belly (abdomen).   No driving for 24 hours (because of the medicine (anesthesia) used during the test).   You may shower.   Do not sign any important legal documents or operate any machinery for 24 hours (because of the anesthesia used during the test).    NUTRITION  Drink plenty of fluids.   You may resume your normal diet as instructed by your doctor.   Begin with a light meal and progress to your normal diet. Heavy or fried foods are harder to  digest and may make you feel sick to your stomach (nauseated).   Avoid alcoholic beverages for 24 hours or as instructed.    MEDICATIONS  You may resume your normal medications.   WHAT YOU CAN EXPECT TODAY  Some feelings of bloating in the abdomen.   Passage of more gas than usual.   Spotting of blood in your stool or on the toilet paper  .  IF YOU HAD POLYPS REMOVED DURING THE COLONOSCOPY:  Eat a soft diet IF YOU HAVE NAUSEA, BLOATING, ABDOMINAL PAIN, OR VOMITING.    FINDING OUT THE RESULTS OF YOUR TEST Not all test results are available during your visit. DR. Oneida Alar WILL CALL YOU WITHIN 14 DAYS OF YOUR PROCEDUE WITH YOUR RESULTS. Do not assume everything is normal if you have not heard from DR. FIELDS, CALL HER OFFICE AT [phone].  SEEK IMMEDIATE MEDICAL ATTENTION AND CALL THE OFFICE: [phone] IF:  You have more than a spotting of blood in your stool.   Your belly is swollen (abdominal distention).   You are nauseated or vomiting.   You have a temperature over 101F.   You have abdominal pain or discomfort that is severe or gets worse throughout the day.   High-Fiber Diet A high-fiber diet changes your normal diet to include more whole grains, legumes, fruits, and vegetables. Changes in the diet involve replacing refined carbohydrates with unrefined foods. The calorie level of the diet is essentially  unchanged. The Dietary Reference Intake (recommended amount) for adult males is 38 grams per day. For adult females, it is 25 grams per day. Pregnant and lactating women should consume 28 grams of fiber per day. Fiber is the intact part of a plant that is not broken down during digestion. Functional fiber is fiber that has been isolated from the plant to provide a beneficial effect in the body. PURPOSE  Increase stool bulk.   Ease and regulate bowel movements.   Lower cholesterol.   REDUCE RISK OF COLON CANCER  INDICATIONS THAT YOU NEED MORE  FIBER  Constipation and hemorrhoids.   Uncomplicated diverticulosis (intestine condition) and irritable bowel syndrome.   Weight management.   As a protective measure against hardening of the arteries (atherosclerosis), diabetes, and cancer.   GUIDELINES FOR INCREASING FIBER IN THE DIET  Start adding fiber to the diet slowly. A gradual increase of about 5 more grams (2 slices of whole-wheat bread, 2 servings of most fruits or vegetables, or 1 bowl of high-fiber cereal) per day is best. Too rapid an increase in fiber may result in constipation, flatulence, and bloating.   Drink enough water and fluids to keep your urine clear or pale yellow. Water, juice, or caffeine-free drinks are recommended. Not drinking enough fluid may cause constipation.   Eat a variety of high-fiber foods rather than one type of fiber.   Try to increase your intake of fiber through using high-fiber foods rather than fiber pills or supplements that contain small amounts of fiber.   The goal is to change the types of food eaten. Do not supplement your present diet with high-fiber foods, but replace foods in your present diet.   INCLUDE A VARIETY OF FIBER SOURCES  Replace refined and processed grains with whole grains, canned fruits with fresh fruits, and incorporate other fiber sources. White rice, white breads, and most bakery goods contain little or no fiber.   Brown whole-grain rice, buckwheat oats, and many fruits and vegetables are all good sources of fiber. These include: broccoli, Brussels sprouts, cabbage, cauliflower, beets, sweet potatoes, white potatoes (skin on), carrots, tomatoes, eggplant, squash, berries, fresh fruits, and dried fruits.   Cereals appear to be the richest source of fiber. Cereal fiber is found in whole grains and bran. Bran is the fiber-rich outer coat of cereal grain, which is largely removed in refining. In whole-grain cereals, the bran remains. In breakfast cereals, the largest  amount of fiber is found in those with "bran" in their names. The fiber content is sometimes indicated on the label.   You may need to include additional fruits and vegetables each day.   In baking, for 1 cup white flour, you may use the following substitutions:   1 cup whole-wheat flour minus 2 tablespoons.   1/2 cup white flour plus 1/2 cup whole-wheat flour.   Polyps, Colon  A polyp is extra tissue that grows inside your body. Colon polyps grow in the large intestine. The large intestine, also called the colon, is part of your digestive system. It is a long, hollow tube at the end of your digestive tract where your body makes and stores stool. Most polyps are not dangerous. They are benign. This means they are not cancerous. But over time, some types of polyps can turn into cancer. Polyps that are smaller than a pea are usually not harmful. But larger polyps could someday become or may already be cancerous. To be safe, doctors remove all polyps and test them.  WHO GETS POLYPS? Anyone can get polyps, but certain people are more likely than others. You may have a greater chance of getting polyps if:  You are over 50.   You have had polyps before.   Someone in your family has had polyps.   Someone in your family has had cancer of the large intestine.   Find out if someone in your family has had polyps. You may also be more likely to get polyps if you:   Eat a lot of fatty foods   Smoke   Drink alcohol   Do not exercise  Eat too much   PREVENTION There is not one sure way to prevent polyps. You might be able to lower your risk of getting them if you:  Eat more fruits and vegetables and less fatty food.   Do not smoke.   Avoid alcohol.   Exercise every day.   Lose weight if you are overweight.   Eating more calcium and folate can also lower your risk of getting polyps. Some foods that are rich in calcium are milk, cheese, and broccoli. Some foods that are rich in folate  are chickpeas, kidney beans, and spinach.   Hemorrhoids Hemorrhoids are dilated (enlarged) veins around the rectum. Sometimes clots will form in the veins. This makes them swollen and painful. These are called thrombosed hemorrhoids. Causes of hemorrhoids include:  Constipation.   Straining to have a bowel movement.   HEAVY LIFTING  HOME CARE INSTRUCTIONS  Eat a well balanced diet and drink 6 to 8 glasses of water every day to avoid constipation. You may also use a bulk laxative.   Avoid straining to have bowel movements.   Keep anal area dry and clean.   Do not use a donut shaped pillow or sit on the toilet for long periods. This increases blood pooling and pain.   Move your bowels when your body has the urge; this will require less straining and will decrease pain and pressure.

## 2017-11-14 NOTE — Interval H&P Note (Signed)
History and Physical Interval Note:  11/14/2017 7:40 AM  Juan Lopez  has presented today for surgery, with the diagnosis of change in bowel habits  The various methods of treatment have been discussed with the patient and family. After consideration of risks, benefits and other options for treatment, the patient has consented to  Procedure(s) with comments: COLONOSCOPY ([name]) - 7:30am as a surgical intervention .  The patient's history has been reviewed, patient examined, no change in status, stable for surgery.  I have reviewed the patient's chart and labs.  Questions were answered to the patient's satisfaction.     [employer]

## 2017-11-14 NOTE — Op Note (Signed)
[facility] Patient Name: Juan Lopez Procedure Date: 11/14/2017 7:08 AM MRN: [id number] Date of Birth: 06/07/1981 Attending MD: Barney Drain MD, MD CSN: [id number] Age: 37 Admit Type: Outpatient Procedure:                Colonoscopy WITH COLD FORCEPS BOPSY/COLD FORCEPS                            POLYPECTOMY Indications:              Change in bowel habits Providers:                Barney Drain MD, MD, Charlsie Quest. Theda Sers RN, RN,                            Aram Candela Referring MD:             Redmond School, MD Medicines:                Meperidine 100 mg IV, Midazolam 6 mg IV,                            Promethazine 78.2 mg IV Complications:            No immediate complications. Estimated Blood Loss:     Estimated blood loss was minimal. Procedure:                Pre-Anesthesia Assessment:                           - Prior to the procedure, a History and Physical                            was performed, and patient medications and                            allergies were reviewed. The patient's tolerance of                            previous anesthesia was also reviewed. The risks                            and benefits of the procedure and the sedation                            options and risks were discussed with the patient.                            All questions were answered, and informed consent                            was obtained. Prior Anticoagulants: The patient has                            taken no previous anticoagulant or antiplatelet  agents. ASA Grade Assessment: II - A patient with                            mild systemic disease. After reviewing the risks                            and benefits, the patient was deemed in                            satisfactory condition to undergo the procedure.                            After obtaining informed consent, the colonoscope                            was passed under direct  vision. Throughout the                            procedure, the patient's blood pressure, pulse, and                            oxygen saturations were monitored continuously. The                            EC-[id number] ([id number]) scope was introduced through                            the anus and advanced to the 7 cm into the ileum.                            The colonoscopy was somewhat difficult due to a                            tortuous colon. Successful completion of the                            procedure was aided by COLOWRAP. The patient                            tolerated the procedure well. The quality of the                            bowel preparation was excellent. The terminal                            ileum, ileocecal valve, appendiceal orifice, and                            rectum were photographed. Scope In: 7:56:29 AM Scope Out: 8:09:05 AM Scope Withdrawal Time: 0 hours 10 minutes 47 seconds  Total Procedure Duration: 0 hours 12 minutes 36 seconds  Findings:      The terminal ileum appeared normal.      A 3 mm polyp was found in  the proximal ascending colon. The polyp was       sessile. The polyp was removed with a cold biopsy forceps. Resection and       retrieval were complete.      The recto-sigmoid colon and sigmoid colon were moderately redundant.       This was biopsied with a cold forceps for evaluation of microscopic       colitis.      A few small-mouthed diverticula were found in the recto-sigmoid colon       and sigmoid colon.      External and internal hemorrhoids were found during retroflexion. The       hemorrhoids were moderate. Impression:               - The examined portion of the ileum was normal.                           - One 3 mm polyp in the proximal ascending colon,                            removed with a cold biopsy forceps. Resected and                            retrieved.                           - Redundant colon.                            - Diverticulosis in the recto-sigmoid colon and in                            the sigmoid colon.                           - External and internal hemorrhoids. Moderate Sedation:      Moderate (conscious) sedation was administered by the endoscopy nurse       and supervised by the endoscopist. The following parameters were       monitored: oxygen saturation, heart rate, blood pressure, and response       to care. Total physician intraservice time was 24 minutes. Recommendation:           - Repeat colonoscopy in 5-10 years for surveillance                            IF A SIMPLE ADENOMA WAS REMOVED.                           - High fiber diet.                           - Continue present medications.                           - Await pathology results.                           - Return to my  office at appointment to be                            scheduled.                           - Patient has a contact number available for                            emergencies. The signs and symptoms of potential                            delayed complications were discussed with the                            patient. Return to normal activities tomorrow.                            Written discharge instructions were provided to the                            patient. Procedure Code(s):        --- Professional ---                           [phone], Colonoscopy, flexible; with biopsy, single                            or multiple                           99152, Moderate sedation services provided by the                            same physician or other qualified health care                            professional performing the diagnostic or                            therapeutic service that the sedation supports,                            requiring the presence of an independent trained                            observer to assist in the monitoring of the                             patient's level of consciousness and physiological                            status; initial 15 minutes of intraservice time,                            patient age  5 years or older                           [phone], Moderate sedation services; each additional                            15 minutes intraservice time Diagnosis Code(s):        --- Professional ---                           K64.8, Other hemorrhoids                           D12.2, Benign neoplasm of ascending colon                           R19.4, Change in bowel habit                           K57.30, Diverticulosis of large intestine without                            perforation or abscess without bleeding                           Q43.8, Other specified congenital malformations of                            intestine CPT copyright 2016 [employer]. All rights reserved. The codes documented in this report are preliminary and upon coder review may  be revised to meet current compliance requirements. Barney Drain, MD Barney Drain MD, MD 11/14/2017 8:22:32 AM This report has been signed electronically. Number of Addenda: 0

## 2017-11-15 NOTE — Progress Notes (Signed)
PT is aware.

## 2017-11-16 NOTE — Progress Notes (Signed)
ON RECALL  °

## 2017-11-18 ENCOUNTER — Encounter (HOSPITAL_COMMUNITY): Payer: Self-pay | Admitting: Gastroenterology

## 2018-01-26 ENCOUNTER — Encounter: Payer: Self-pay | Admitting: Gastroenterology

## 2018-04-24 DIAGNOSIS — I10 Essential (primary) hypertension: Secondary | ICD-10-CM | POA: Diagnosis not present

## 2018-07-28 ENCOUNTER — Telehealth: Payer: Self-pay | Admitting: Gastroenterology

## 2018-07-28 ENCOUNTER — Emergency Department (HOSPITAL_COMMUNITY)
Admission: EM | Admit: 2018-07-28 | Discharge: 2018-07-28 | Disposition: A | Discharge status: Home / Self Care | Payer: 59 | Attending: Emergency Medicine | Admitting: Emergency Medicine

## 2018-07-28 ENCOUNTER — Emergency Department (HOSPITAL_COMMUNITY): Payer: 59

## 2018-07-28 ENCOUNTER — Encounter (HOSPITAL_COMMUNITY): Payer: Self-pay | Admitting: Emergency Medicine

## 2018-07-28 ENCOUNTER — Other Ambulatory Visit: Payer: Self-pay

## 2018-07-28 DIAGNOSIS — I10 Essential (primary) hypertension: Secondary | ICD-10-CM | POA: Diagnosis not present

## 2018-07-28 DIAGNOSIS — R1011 Right upper quadrant pain: Secondary | ICD-10-CM | POA: Diagnosis not present

## 2018-07-28 DIAGNOSIS — R109 Unspecified abdominal pain: Secondary | ICD-10-CM | POA: Diagnosis not present

## 2018-07-28 DIAGNOSIS — I2510 Atherosclerotic heart disease of native coronary artery without angina pectoris: Secondary | ICD-10-CM | POA: Diagnosis not present

## 2018-07-28 DIAGNOSIS — Z79899 Other long term (current) drug therapy: Secondary | ICD-10-CM | POA: Insufficient documentation

## 2018-07-28 DIAGNOSIS — K5730 Diverticulosis of large intestine without perforation or abscess without bleeding: Secondary | ICD-10-CM | POA: Diagnosis not present

## 2018-07-28 DIAGNOSIS — F17210 Nicotine dependence, cigarettes, uncomplicated: Secondary | ICD-10-CM | POA: Insufficient documentation

## 2018-07-28 DIAGNOSIS — K760 Fatty (change of) liver, not elsewhere classified: Secondary | ICD-10-CM | POA: Diagnosis not present

## 2018-07-28 HISTORY — DX: Diverticulitis of intestine, part unspecified, without perforation or abscess without bleeding: K57.92

## 2018-07-28 LAB — CBC WITH DIFFERENTIAL/PLATELET
Abs Immature Granulocytes: 0.03 10*3/uL (ref 0.00–0.07)
Basophils Absolute: 0 10*3/uL (ref 0.0–0.1)
Basophils Relative: 0 %
EOS ABS: 0.1 10*3/uL (ref 0.0–0.5)
EOS PCT: 2 %
HEMATOCRIT: 53 % — AB (ref 39.0–52.0)
Hemoglobin: 17.4 g/dL — ABNORMAL HIGH (ref 13.0–17.0)
IMMATURE GRANULOCYTES: 0 %
LYMPHS ABS: 2.7 10*3/uL (ref 0.7–4.0)
Lymphocytes Relative: 34 %
MCH: 31 pg (ref 26.0–34.0)
MCHC: 32.8 g/dL (ref 30.0–36.0)
MCV: 94.3 fL (ref 80.0–100.0)
MONOS PCT: 8 %
Monocytes Absolute: 0.7 10*3/uL (ref 0.1–1.0)
NEUTROS PCT: 56 %
Neutro Abs: 4.4 10*3/uL (ref 1.7–7.7)
Platelets: 270 10*3/uL (ref 150–400)
RBC: 5.62 MIL/uL (ref 4.22–5.81)
RDW: 12.6 % (ref 11.5–15.5)
WBC: 7.9 10*3/uL (ref 4.0–10.5)
nRBC: 0 % (ref 0.0–0.2)

## 2018-07-28 LAB — COMPREHENSIVE METABOLIC PANEL
ALBUMIN: 3.9 g/dL (ref 3.5–5.0)
ALK PHOS: 80 U/L (ref 38–126)
ALT: 48 U/L — AB (ref 0–44)
AST: 28 U/L (ref 15–41)
Anion gap: 6 (ref 5–15)
BILIRUBIN TOTAL: 0.6 mg/dL (ref 0.3–1.2)
BUN: 10 mg/dL (ref 6–20)
CALCIUM: 8.9 mg/dL (ref 8.9–10.3)
CO2: 28 mmol/L (ref 22–32)
CREATININE: 0.71 mg/dL (ref 0.61–1.24)
Chloride: 103 mmol/L (ref 98–111)
GFR calc Af Amer: >60 mL/min (ref >60)
GFR calc non Af Amer: >60 mL/min (ref >60)
Glucose, Bld: 107 mg/dL — ABNORMAL HIGH (ref 70–99)
Potassium: 4 mmol/L (ref 3.5–5.1)
Sodium: 137 mmol/L (ref 135–145)
TOTAL PROTEIN: 7 g/dL (ref 6.5–8.1)

## 2018-07-28 LAB — URINALYSIS, ROUTINE W REFLEX MICROSCOPIC
Bilirubin Urine: NEGATIVE
Glucose, UA: NEGATIVE mg/dL
Hgb urine dipstick: NEGATIVE
KETONES UR: NEGATIVE mg/dL
LEUKOCYTES UA: NEGATIVE
Nitrite: NEGATIVE
PH: 6 (ref 5.0–8.0)
PROTEIN: NEGATIVE mg/dL
Specific Gravity, Urine: 1.018 (ref 1.005–1.030)

## 2018-07-28 LAB — LIPASE, BLOOD: Lipase: 30 U/L (ref 11–51)

## 2018-07-28 LAB — TROPONIN I

## 2018-07-28 MED ORDER — MORPHINE SULFATE (PF) 4 MG/ML IV SOLN: 4.0000 mg | INTRAVENOUS | Status: DC | PRN

## 2018-07-28 MED ORDER — FAMOTIDINE IN NACL 20-0.9 MG/50ML-% IV SOLN
20.0000 mg | Freq: Once | INTRAVENOUS | Status: AC
  Administered 2018-07-28: 20 mg via INTRAVENOUS
  Filled 2018-07-28: qty 50

## 2018-07-28 MED ORDER — HYDROCODONE-ACETAMINOPHEN 5-325 MG PO TABS: ORAL_TABLET | ORAL | 0 refills | Status: DC

## 2018-07-28 MED ORDER — SODIUM CHLORIDE 0.9 % IV SOLN
INTRAVENOUS | Status: DC
  Administered 2018-07-28: 16:00:00 via INTRAVENOUS

## 2018-07-28 MED ORDER — SODIUM CHLORIDE 0.9 % IV BOLUS
500.0000 mL | Freq: Once | INTRAVENOUS | Status: AC
  Administered 2018-07-28: 500 mL via INTRAVENOUS

## 2018-07-28 MED ORDER — IOPAMIDOL (ISOVUE-300) INJECTION 61%
100.0000 mL | Freq: Once | INTRAVENOUS | Status: AC | PRN
  Administered 2018-07-28: 100 mL via INTRAVENOUS

## 2018-07-28 MED ORDER — ONDANSETRON HCL 4 MG/2ML IJ SOLN: 4.0000 mg | INTRAMUSCULAR | Status: DC | PRN

## 2018-07-28 MED ORDER — ONDANSETRON 4 MG PO TBDP: 4.0000 mg | ORAL_TABLET | Freq: Three times a day (TID) | ORAL | 0 refills | Status: DC | PRN

## 2018-07-28 MED ORDER — FAMOTIDINE 20 MG PO TABS: 20.0000 mg | ORAL_TABLET | Freq: Every day | ORAL | 0 refills | Status: DC

## 2018-07-28 NOTE — Telephone Encounter (Signed)
PT is aware.

## 2018-07-28 NOTE — ED Notes (Signed)
Patient rates pain at a 4 and states he is driving. No pain medication given at this time. Denies nausea. Advised to let nurse know if pain worsens.

## 2018-07-28 NOTE — Telephone Encounter (Signed)
Pt said he has been having abdominal pain ( RUQ) x a week but has been worse today.  It was so sharp a couple of times it almost took his breath. It has settled down some at this time and I advised him he should go to ED if it worsens. He does not have normal BM's, although he says it is normal for him. Some days his stool will be hard as a rock and some days diarrhea.  He is aware I am sending this message to Dr. Oneida Alar, and she is doing procedures at the hospital.  But he will go to ED if worsens.

## 2018-07-28 NOTE — ED Notes (Signed)
Advised patient not to drive while taking prescription pain medication. Patient verbalized understanding.  

## 2018-07-28 NOTE — Telephone Encounter (Signed)
PLEASE CALL PT. WE HAVE NEVER EVALUATED HIM FOR RUQ ABDOMINAL PAIN, IF HE IS HAVING SEVERE RUQ HE SHOULD GO TO HE NEAREST ED.

## 2018-07-28 NOTE — ED Triage Notes (Signed)
Pain to RUQ off and on x 1week, has gotten worse in last 2 days.

## 2018-07-28 NOTE — ED Provider Notes (Signed)
[facility] EMERGENCY DEPARTMENT Provider Note   CSN: [id number] Arrival date & time: 07/28/18  1157     History   Chief Complaint Chief Complaint  Patient presents with  . Abdominal Pain    HPI Juan Lopez is a 37 y.o. male.  HPI  Pt was seen at 1230.  Per pt, c/o gradual onset and persistence of constant RUQ abd "pain" for the past 1 week, worse over the past several days.  Has been associated with nausea.  Describes the abd pain as constant, never going away since onset.  Pt is unclear if eating makes it worse. Denies diarrhea, no fevers, no back pain, no rash, no CP/SOB, no cough, no black or blood in stools.      Past Medical History:  Diagnosis Date  . Acute myopericarditis    a. dx 10/2016  . CAD (coronary artery disease)    a. LHC 10/2016 with mild luminal irregularities. Placed on statin   . Diverticulitis   . Family history of early CAD   . HTN (hypertension)     Patient Active Problem List   Diagnosis Date Noted  . Change in bowel habits 11/02/2017  . Coronary artery disease involving native coronary artery of native heart without angina pectoris   . NSTEMI (non-ST elevated myocardial infarction) ([place]) 10/16/2016  . Acute myopericarditis 10/16/2016  . Essential hypertension 10/16/2016  . Family history of early CAD 10/16/2016    Past Surgical History:  Procedure Laterality Date  . CARDIAC CATHETERIZATION [name] 10/16/2016   Procedure: Left Heart Cath and Coronary Angiography;  Surgeon: Peter M Martinique, MD;  Location: [place] [facility];  Service: Cardiovascular;  Laterality: [name];  . COLONOSCOPY [name] 11/14/2017   Procedure: COLONOSCOPY;  Surgeon: Danie Binder, MD;  Location: [facility];  Service: Endoscopy;  Laterality: [name];  7:30am  . POLYPECTOMY  11/14/2017   Procedure: POLYPECTOMY;  Surgeon: Danie Binder, MD;  Location: [facility];  Service: Endoscopy;;  Ascending colon (CS)  . TONSILLECTOMY    . WISDOM TOOTH EXTRACTION     AGE 68-25         Home Medications    Prior to Admission medications   Medication Sig Start Date End Date Taking? Authorizing Provider  acetaminophen (TYLENOL) 325 MG tablet Take 325-650 mg by mouth every 6 (six) hours as needed for moderate pain. For pain    [provider]  diltiazem (CARDIZEM) 30 MG tablet TAKE 1 TABLET (30 MG TOTAL) BY MOUTH 2 (TWO) TIMES DAILY. 03/21/17   Arnoldo Lenis, MD  lisinopril (PRINIVIL,ZESTRIL) 5 MG tablet Take 1 tablet (5 mg total) daily by mouth. 08/10/17 11/08/17  Arnoldo Lenis, MD  nitroGLYCERIN (NITROSTAT) 0.4 MG SL tablet Place 1 tablet (0.4 mg total) under the tongue every 5 (five) minutes x 3 doses as needed for chest pain. 10/17/16   Eileen Stanford, PA-C    Family History Family History  Problem Relation Age of Onset  . Diabetes Mother   . Heart failure Mother   . Colon polyps Mother        REQUIRED COLECTOMY  . Heart failure Father   . Heart attack Maternal Grandmother   . Pneumonia Maternal Grandfather   . Diabetes Paternal Grandmother   . Obesity Paternal Grandmother   . Heart attack Paternal Grandfather   . Colon cancer Neg Hx     Social History Social History   Tobacco Use  . Smoking status: Current Some Day Smoker  Packs/day: 0.50    Last attempt to quit: 09/26/2015    Years since quitting: 2.8  . Smokeless tobacco: Never Used  Substance Use Topics  . Alcohol use: Yes    Comment: rare  . Drug use: No     Allergies   Bee venom   Review of Systems Review of Systems ROS: Statement: All systems negative except as marked or noted in the HPI; Constitutional: Negative for fever and chills. ; ; Eyes: Negative for eye pain, redness and discharge. ; ; ENMT: Negative for ear pain, hoarseness, nasal congestion, sinus pressure and sore throat. ; ; Cardiovascular: Negative for chest pain, palpitations, diaphoresis, dyspnea and peripheral edema. ; ; Respiratory: Negative for cough, wheezing and stridor. ; ;  Gastrointestinal: +nausea, abd pain. Negative for vomiting, diarrhea, blood in stool, hematemesis, jaundice and rectal bleeding. . ; ; Genitourinary: Negative for dysuria, flank pain and hematuria. ; ; Musculoskeletal: Negative for back pain and neck pain. Negative for swelling and trauma.; ; Skin: Negative for pruritus, rash, abrasions, blisters, bruising and skin lesion.; ; Neuro: Negative for headache, lightheadedness and neck stiffness. Negative for weakness, altered level of consciousness, altered mental status, extremity weakness, paresthesias, involuntary movement, seizure and syncope.       Physical Exam Updated Vital Signs BP (!) 158/99 (BP Location: Right Arm)   Pulse (!) 104   Temp 98 F (36.7 C) (Oral)   Resp 18   Ht 6' (1.829 m)   Wt 131.5 kg   SpO2 98%   BMI 39.33 kg/m   BP 130/90 (BP Location: Right Arm)   Pulse 83   Temp 98 F (36.7 C) (Oral)   Resp 20   Ht 6' (1.829 m)   Wt 131.5 kg   SpO2 97%   BMI 39.33 kg/m    Physical Exam 1235: Physical examination:  Nursing notes reviewed; Vital signs and O2 SAT reviewed;  Constitutional: Well developed, Well nourished, Well hydrated, In no acute distress; Head:  Normocephalic, atraumatic; Eyes: EOMI, PERRL, No scleral icterus; ENMT: Mouth and pharynx normal, Mucous membranes moist; Neck: Supple, Full range of motion, No lymphadenopathy; Cardiovascular: Regular rate and rhythm, No gallop; Respiratory: Breath sounds clear & equal bilaterally, No wheezes.  Speaking full sentences with ease, Normal respiratory effort/excursion; Chest: Nontender, Movement normal; Abdomen: Soft, +RUQ tenderness to palp. No rebound or guarding. Nondistended, Normal bowel sounds; Genitourinary: No CVA tenderness; Extremities: Peripheral pulses normal, No tenderness, No edema, No calf edema or asymmetry.; Neuro: AA&Ox3, Major CN grossly intact.  Speech clear. No gross focal motor or sensory deficits in extremities.; Skin: Color normal, Warm,  Dry.   ED Treatments / Results  Labs (all labs ordered are listed, but only abnormal results are displayed)   EKG EKG Interpretation  Date/Time:  Friday July 28 2018 13:46:11 EDT Ventricular Rate:  83 PR Interval:    QRS Duration: 90 QT Interval:  353 QTC Calculation: 415 R Axis:   113 Text Interpretation:  Sinus rhythm Left posterior fascicular block Baseline wander When compared with ECG of 10/16/2016 No significant change was found Confirmed by Francine Graven [phone]) on 07/28/2018 1:51:37 PM   Radiology   Procedures Procedures (including critical care time)  Medications Ordered in ED Medications  morphine 4 MG/ML injection 4 mg (has no administration in time range)  ondansetron (ZOFRAN) injection 4 mg (has no administration in time range)  famotidine (PEPCID) IVPB 20 mg premix (has no administration in time range)  0.9 %  sodium chloride infusion (has no  administration in time range)  sodium chloride 0.9 % bolus 500 mL (has no administration in time range)     Initial Impression / Assessment and Plan / ED Course  I have reviewed the triage vital signs and the nursing notes.  Pertinent labs & imaging results that were available during my care of the patient were reviewed by me and considered in my medical decision making (see chart for details).  MDM Reviewed: previous chart, nursing note and vitals Reviewed previous: labs and ECG Interpretation: labs, ECG, x-ray and ultrasound   Results for orders placed or performed during the hospital encounter of 07/28/18  Comprehensive metabolic panel  Result Value Ref Range   Sodium 137 135 - 145 mmol/L   Potassium 4.0 3.5 - 5.1 mmol/L   Chloride 103 98 - 111 mmol/L   CO2 28 22 - 32 mmol/L   Glucose, Bld 107 (H) 70 - 99 mg/dL   BUN 10 6 - 20 mg/dL   Creatinine, Ser 0.71 0.61 - 1.24 mg/dL   Calcium 8.9 8.9 - 10.3 mg/dL   Total Protein 7.0 6.5 - 8.1 g/dL   Albumin 3.9 3.5 - 5.0 g/dL   AST 28 15 - 41 U/L   ALT  48 (H) 0 - 44 U/L   Alkaline Phosphatase 80 38 - 126 U/L   Total Bilirubin 0.6 0.3 - 1.2 mg/dL   GFR calc non Af Amer >60 >60 mL/min   GFR calc Af Amer >60 >60 mL/min   Anion gap 6 5 - 15  Lipase, blood  Result Value Ref Range   Lipase 30 11 - 51 U/L  CBC with Differential  Result Value Ref Range   WBC 7.9 4.0 - 10.5 K/uL   RBC 5.62 4.22 - 5.81 MIL/uL   Hemoglobin 17.4 (H) 13.0 - 17.0 g/dL   HCT 53.0 (H) 39.0 - 52.0 %   MCV 94.3 80.0 - 100.0 fL   MCH 31.0 26.0 - 34.0 pg   MCHC 32.8 30.0 - 36.0 g/dL   RDW 12.6 11.5 - 15.5 %   Platelets 270 150 - 400 K/uL   nRBC 0.0 0.0 - 0.2 %   Neutrophils Relative % 56 %   Neutro Abs 4.4 1.7 - 7.7 K/uL   Lymphocytes Relative 34 %   Lymphs Abs 2.7 0.7 - 4.0 K/uL   Monocytes Relative 8 %   Monocytes Absolute 0.7 0.1 - 1.0 K/uL   Eosinophils Relative 2 %   Eosinophils Absolute 0.1 0.0 - 0.5 K/uL   Basophils Relative 0 %   Basophils Absolute 0.0 0.0 - 0.1 K/uL   Immature Granulocytes 0 %   Abs Immature Granulocytes 0.03 0.00 - 0.07 K/uL  Urinalysis, Routine w reflex microscopic  Result Value Ref Range   Color, Urine STRAW (A) YELLOW   APPearance CLEAR CLEAR   Specific Gravity, Urine 1.018 1.005 - 1.030   pH 6.0 5.0 - 8.0   Glucose, UA NEGATIVE NEGATIVE mg/dL   Hgb urine dipstick NEGATIVE NEGATIVE   Bilirubin Urine NEGATIVE NEGATIVE   Ketones, ur NEGATIVE NEGATIVE mg/dL   Protein, ur NEGATIVE NEGATIVE mg/dL   Nitrite NEGATIVE NEGATIVE   Leukocytes, UA NEGATIVE NEGATIVE  Troponin I  Result Value Ref Range   Troponin I <0.03 <0.03 ng/mL   US Abdomen Complete Result Date: 07/28/2018 CLINICAL DATA:  Right upper quadrant pain and nausea. EXAM: ABDOMEN ULTRASOUND COMPLETE COMPARISON:  CT scan abdomen dated 08/28/2017 FINDINGS: Gallbladder: No gallstones or wall thickening visualized. No sonographic Percell Stencel  sign noted by sonographer. Common bile duct: Diameter: 5.4 mm, normal Liver: No focal lesion identified. Diffuse increased echogenicity  consistent with hepatic steatosis, as previously demonstrated on the CT scan of 2018. Portal vein is patent on color Doppler imaging with normal direction of blood flow towards the liver. IVC: No abnormality visualized. Pancreas: Visualized portion unremarkable. The majority of the pancreas is obscured by bowel. Spleen: Size and appearance within normal limits. Right Kidney: Length: 12.3 cm. Echogenicity within normal limits. No mass or hydronephrosis visualized. Left Kidney: Length: 13.1 cm. Echogenicity within normal limits. No mass or hydronephrosis visualized. Abdominal aorta: No aneurysm visualized. Other findings: None. IMPRESSION: 1. No acute abnormality. 2. Chronic appendix steatosis. 3. Suboptimal visualization of the pancreas due to bowel gas. Electronically Signed   By: Lorriane Shire M.D.   On: 07/28/2018 13:58   Ct Abdomen Pelvis W Contrast Result Date: 07/28/2018 CLINICAL DATA:  Acute right upper quadrant abdominal pain. EXAM: CT ABDOMEN AND PELVIS WITH CONTRAST TECHNIQUE: Multidetector CT imaging of the abdomen and pelvis was performed using the standard protocol following bolus administration of intravenous contrast. CONTRAST:  185mL ISOVUE-300 IOPAMIDOL (ISOVUE-300) INJECTION 61% COMPARISON:  CT scan of August 28, 2017. FINDINGS: Lower chest: No acute abnormality. Hepatobiliary: No gallstones or biliary dilatation is noted. Fatty infiltration of the liver is noted. Pancreas: Unremarkable. No pancreatic ductal dilatation or surrounding inflammatory changes. Spleen: Normal in size without focal abnormality. Adrenals/Urinary Tract: Adrenal glands are unremarkable. Kidneys are normal, without renal calculi, focal lesion, or hydronephrosis. Bladder is unremarkable. Stomach/Bowel: Stomach is within normal limits. Appendix appears normal. No evidence of bowel wall thickening, distention, or inflammatory changes. Sigmoid diverticulosis is noted without inflammation. Vascular/Lymphatic: No significant  vascular findings are present. No enlarged abdominal or pelvic lymph nodes. Reproductive: Prostate is unremarkable. Other: No abdominal wall hernia or abnormality. No abdominopelvic ascites. Musculoskeletal: No acute or significant osseous findings. IMPRESSION: Fatty infiltration of the liver. Sigmoid diverticulosis without inflammation. No acute abnormality seen in the abdomen or pelvis. Electronically Signed   By: Marijo Conception, M.D.   On: 07/28/2018 15:00   Dg Abd Acute W/chest Result Date: 07/28/2018 CLINICAL DATA:  Abdominal pain.  Nausea.  History of diverticulitis. EXAM: DG ABDOMEN ACUTE W/ 1V CHEST COMPARISON:  CT scan of the abdomen dated 08/28/2017 and chest x-ray dated 10/16/2016 FINDINGS: There is no evidence of dilated bowel loops or free intraperitoneal air. No radiopaque calculi or other significant radiographic abnormality is seen. Heart size and mediastinal contours are within normal limits. Both lungs are clear. IMPRESSION: Negative abdominal radiographs.  No acute cardiopulmonary disease. Electronically Signed   By: Lorriane Shire M.D.   On: 07/28/2018 13:20     1630:  Pt has tol PO well while in the ED without N/V.  No stooling while in the ED.  Abd benign, VSS. Feels better and wants to go home now. Workup reassuring. Tx symptomatically, f/u GI MD. Dx and testing d/w pt.  Questions answered.  Verb understanding, agreeable to d/c home with outpt f/u.    Final Clinical Impressions(s) / ED Diagnoses   Final diagnoses:  None    ED Discharge Orders    None       Francine Graven, DO 07/31/18 [id number]

## 2018-07-28 NOTE — Discharge Instructions (Addendum)
Eat a bland diet, avoiding greasy, fatty, fried foods, as well as spicy and acidic foods or beverages.  Avoid eating within 2 to 3 hours before going to bed or laying down.  Also avoid teas, colas, coffee, chocolate, pepermint and spearment.  Take the prescriptions as directed.  Call your regular medical doctor and your GI doctor on Monday to schedule a follow up appointment next week.  Return to the Emergency Department immediately if worsening.

## 2018-07-28 NOTE — ED Notes (Signed)
Pt was informed that we need a urine sample. Pt states that he urinated recently and can not urinate at this time.

## 2018-07-28 NOTE — Telephone Encounter (Signed)
Pt had colonoscopy by [facility] in FEB and was asking to be seen today. He is having right sided pain and doesn't know what to do. I told him that we close at noon on Fridays and we are completely booked right now. He asked for the nurse to call him [phone]

## 2018-08-12 ENCOUNTER — Other Ambulatory Visit: Payer: Self-pay

## 2018-08-12 ENCOUNTER — Emergency Department (HOSPITAL_COMMUNITY): Payer: 59

## 2018-08-12 ENCOUNTER — Encounter (HOSPITAL_COMMUNITY): Payer: Self-pay

## 2018-08-12 ENCOUNTER — Emergency Department (HOSPITAL_COMMUNITY)
Admission: EM | Admit: 2018-08-12 | Discharge: 2018-08-12 | Disposition: A | Discharge status: Home / Self Care | Payer: 59 | Attending: Emergency Medicine | Admitting: Emergency Medicine

## 2018-08-12 DIAGNOSIS — K760 Fatty (change of) liver, not elsewhere classified: Secondary | ICD-10-CM | POA: Diagnosis not present

## 2018-08-12 DIAGNOSIS — K5732 Diverticulitis of large intestine without perforation or abscess without bleeding: Secondary | ICD-10-CM | POA: Insufficient documentation

## 2018-08-12 DIAGNOSIS — R110 Nausea: Secondary | ICD-10-CM | POA: Diagnosis not present

## 2018-08-12 DIAGNOSIS — K5900 Constipation, unspecified: Secondary | ICD-10-CM | POA: Insufficient documentation

## 2018-08-12 DIAGNOSIS — Z79899 Other long term (current) drug therapy: Secondary | ICD-10-CM | POA: Insufficient documentation

## 2018-08-12 DIAGNOSIS — R1032 Left lower quadrant pain: Secondary | ICD-10-CM | POA: Diagnosis present

## 2018-08-12 DIAGNOSIS — F17210 Nicotine dependence, cigarettes, uncomplicated: Secondary | ICD-10-CM | POA: Insufficient documentation

## 2018-08-12 DIAGNOSIS — I2510 Atherosclerotic heart disease of native coronary artery without angina pectoris: Secondary | ICD-10-CM | POA: Diagnosis not present

## 2018-08-12 DIAGNOSIS — I252 Old myocardial infarction: Secondary | ICD-10-CM | POA: Diagnosis not present

## 2018-08-12 DIAGNOSIS — I10 Essential (primary) hypertension: Secondary | ICD-10-CM | POA: Insufficient documentation

## 2018-08-12 LAB — COMPREHENSIVE METABOLIC PANEL WITH GFR
ALT: 44 U/L (ref 0–44)
AST: 27 U/L (ref 15–41)
Albumin: 3.9 g/dL (ref 3.5–5.0)
Alkaline Phosphatase: 82 U/L (ref 38–126)
Anion gap: 8 (ref 5–15)
BUN: 10 mg/dL (ref 6–20)
CO2: 25 mmol/L (ref 22–32)
Calcium: 8.6 mg/dL — ABNORMAL LOW (ref 8.9–10.3)
Chloride: 103 mmol/L (ref 98–111)
Creatinine, Ser: 0.73 mg/dL (ref 0.61–1.24)
GFR calc Af Amer: >60 mL/min
GFR calc non Af Amer: >60 mL/min
Glucose, Bld: 113 mg/dL — ABNORMAL HIGH (ref 70–99)
Potassium: 4.3 mmol/L (ref 3.5–5.1)
Sodium: 136 mmol/L (ref 135–145)
Total Bilirubin: 1.1 mg/dL (ref 0.3–1.2)
Total Protein: 7.1 g/dL (ref 6.5–8.1)

## 2018-08-12 LAB — CBC WITH DIFFERENTIAL/PLATELET
Abs Immature Granulocytes: 0.03 K/uL (ref 0.00–0.07)
Basophils Absolute: 0 K/uL (ref 0.0–0.1)
Basophils Relative: 0 %
Eosinophils Absolute: 0.2 K/uL (ref 0.0–0.5)
Eosinophils Relative: 2 %
HCT: 53.8 % — ABNORMAL HIGH (ref 39.0–52.0)
Hemoglobin: 16.9 g/dL (ref 13.0–17.0)
Immature Granulocytes: 0 %
Lymphocytes Relative: 20 %
Lymphs Abs: 2.1 K/uL (ref 0.7–4.0)
MCH: 29.5 pg (ref 26.0–34.0)
MCHC: 31.4 g/dL (ref 30.0–36.0)
MCV: 94.1 fL (ref 80.0–100.0)
Monocytes Absolute: 0.8 K/uL (ref 0.1–1.0)
Monocytes Relative: 8 %
Neutro Abs: 7.2 K/uL (ref 1.7–7.7)
Neutrophils Relative %: 70 %
Platelets: 262 K/uL (ref 150–400)
RBC: 5.72 MIL/uL (ref 4.22–5.81)
RDW: 13 % (ref 11.5–15.5)
WBC: 10.3 K/uL (ref 4.0–10.5)
nRBC: 0 % (ref 0.0–0.2)

## 2018-08-12 LAB — LIPASE, BLOOD: Lipase: 26 U/L (ref 11–51)

## 2018-08-12 LAB — URINALYSIS, ROUTINE W REFLEX MICROSCOPIC
Bilirubin Urine: NEGATIVE
Glucose, UA: NEGATIVE mg/dL
Hgb urine dipstick: NEGATIVE
Ketones, ur: NEGATIVE mg/dL
Leukocytes, UA: NEGATIVE
Nitrite: NEGATIVE
Protein, ur: NEGATIVE mg/dL
Specific Gravity, Urine: 1.017 (ref 1.005–1.030)
pH: 7 (ref 5.0–8.0)

## 2018-08-12 MED ORDER — IOPAMIDOL (ISOVUE-300) INJECTION 61%
100.0000 mL | Freq: Once | INTRAVENOUS | Status: AC | PRN
  Administered 2018-08-12: 100 mL via INTRAVENOUS

## 2018-08-12 MED ORDER — CIPROFLOXACIN HCL 500 MG PO TABS: 500.0000 mg | ORAL_TABLET | Freq: Two times a day (BID) | ORAL | 0 refills | Status: DC

## 2018-08-12 MED ORDER — METRONIDAZOLE 500 MG PO TABS
500.0000 mg | ORAL_TABLET | Freq: Once | ORAL | Status: AC
  Administered 2018-08-12: 500 mg via ORAL
  Filled 2018-08-12: qty 1

## 2018-08-12 MED ORDER — METRONIDAZOLE 500 MG PO TABS: 500.0000 mg | ORAL_TABLET | Freq: Two times a day (BID) | ORAL | 0 refills | Status: DC

## 2018-08-12 MED ORDER — ONDANSETRON HCL 4 MG PO TABS: 4.0000 mg | ORAL_TABLET | Freq: Four times a day (QID) | ORAL | 0 refills | Status: DC

## 2018-08-12 MED ORDER — MORPHINE SULFATE (PF) 4 MG/ML IV SOLN
4.0000 mg | Freq: Once | INTRAVENOUS | Status: AC
  Administered 2018-08-12: 4 mg via INTRAVENOUS
  Filled 2018-08-12: qty 1

## 2018-08-12 MED ORDER — CIPROFLOXACIN HCL 250 MG PO TABS
500.0000 mg | ORAL_TABLET | Freq: Once | ORAL | Status: AC
  Administered 2018-08-12: 500 mg via ORAL
  Filled 2018-08-12: qty 2

## 2018-08-12 MED ORDER — SODIUM CHLORIDE 0.9 % IV BOLUS
1000.0000 mL | Freq: Once | INTRAVENOUS | Status: AC
  Administered 2018-08-12: 1000 mL via INTRAVENOUS

## 2018-08-12 MED ORDER — ONDANSETRON HCL 4 MG/2ML IJ SOLN
4.0000 mg | Freq: Once | INTRAMUSCULAR | Status: AC
  Administered 2018-08-12: 4 mg via INTRAVENOUS
  Filled 2018-08-12: qty 2

## 2018-08-12 NOTE — Discharge Instructions (Addendum)
Try taking OTC Miralax 1-2 times per day.  One capful mixed in 8-10 oz of water or juice to help with constipation as needed.   Call Dr. Oneida Alar office on Monday to arrange a follow-up appt.

## 2018-08-12 NOTE — ED Triage Notes (Signed)
Pt c/o lower abd pain for the past 3 or 4 days.  Reports no bm for several days.

## 2018-08-12 NOTE — ED Provider Notes (Signed)
[facility] EMERGENCY DEPARTMENT Provider Note   CSN: [id number] Arrival date & time: 08/12/18  0753     History   Chief Complaint Chief Complaint  Patient presents with  . Abdominal Pain    HPI KADDEN OSTERHOUT is a 37 y.o. male.  HPI   NEVAN CREIGHTON is a 37 y.o. male who presents to the Emergency Department complaining of mid and left lower abdominal pain for 3-4 days.  Describes pain as waxing and waning in intensity. He feels a constant urge to defecate when pain is intense.  Symptoms associated with nausea, but no vomiting.  No fever or chills.  Also complains of constipation for 2 days.  Took MOM without relief.  He is concerned that his diverticulitis may be reoccurring.  Had similar episode last November. Sx's improved after antibiotics. Unable to see his GI until next week.  No dysuria.  No hx of abdominal surgeries.    Past Medical History:  Diagnosis Date  . Acute myopericarditis    a. dx 10/2016  . CAD (coronary artery disease)    a. LHC 10/2016 with mild luminal irregularities. Placed on statin   . Diverticulitis   . Family history of early CAD   . HTN (hypertension)     Patient Active Problem List   Diagnosis Date Noted  . Change in bowel habits 11/02/2017  . Coronary artery disease involving native coronary artery of native heart without angina pectoris   . NSTEMI (non-ST elevated myocardial infarction) ([place]) 10/16/2016  . Acute myopericarditis 10/16/2016  . Essential hypertension 10/16/2016  . Family history of early CAD 10/16/2016    Past Surgical History:  Procedure Laterality Date  . CARDIAC CATHETERIZATION [name] 10/16/2016   Procedure: Left Heart Cath and Coronary Angiography;  Surgeon: Peter M Martinique, MD;  Location: [facility];  Service: Cardiovascular;  Laterality: [name];  . COLONOSCOPY [name] 11/14/2017   Procedure: COLONOSCOPY;  Surgeon: Danie Binder, MD;  Location: [facility];  Service: Endoscopy;  Laterality: [name];  7:30am  .  POLYPECTOMY  11/14/2017   Procedure: POLYPECTOMY;  Surgeon: Danie Binder, MD;  Location: [facility];  Service: Endoscopy;;  Ascending colon (CS)  . TONSILLECTOMY    . WISDOM TOOTH EXTRACTION     AGE 59-25      Home Medications    Prior to Admission medications   Medication Sig Start Date End Date Taking? Authorizing Provider  acetaminophen (TYLENOL) 325 MG tablet Take 325-650 mg by mouth every 6 (six) hours as needed for moderate pain. For pain    [provider]  diltiazem (CARDIZEM) 30 MG tablet TAKE 1 TABLET (30 MG TOTAL) BY MOUTH 2 (TWO) TIMES DAILY. 03/21/17   Arnoldo Lenis, MD  famotidine (PEPCID) 20 MG tablet Take 1 tablet (20 mg total) by mouth daily. 07/28/18   Francine Graven, DO  HYDROcodone-acetaminophen (NORCO/VICODIN) 5-325 MG tablet 1 or 2 tabs PO q8 hours prn pain 07/28/18   Francine Graven, DO  lisinopril (PRINIVIL,ZESTRIL) 5 MG tablet Take 1 tablet (5 mg total) daily by mouth. 08/10/17 11/08/17  Arnoldo Lenis, MD  nitroGLYCERIN (NITROSTAT) 0.4 MG SL tablet Place 1 tablet (0.4 mg total) under the tongue every 5 (five) minutes x 3 doses as needed for chest pain. 10/17/16   Eileen Stanford, PA-C  ondansetron (ZOFRAN ODT) 4 MG disintegrating tablet Take 1 tablet (4 mg total) by mouth every 8 (eight) hours as needed for nausea or vomiting. 07/28/18   Thurnell Garbe,  Nunzio Cory, DO    Family History Family History  Problem Relation Age of Onset  . Diabetes Mother   . Heart failure Mother   . Colon polyps Mother        REQUIRED COLECTOMY  . Heart failure Father   . Heart attack Maternal Grandmother   . Pneumonia Maternal Grandfather   . Diabetes Paternal Grandmother   . Obesity Paternal Grandmother   . Heart attack Paternal Grandfather   . Colon cancer Neg Hx     Social History Social History   Tobacco Use  . Smoking status: Current Some Day Smoker    Packs/day: 0.50    Last attempt to quit: 09/26/2015    Years since quitting: 2.8  . Smokeless  tobacco: Never Used  Substance Use Topics  . Alcohol use: Yes    Comment: rare  . Drug use: No     Allergies   Bee venom   Review of Systems Review of Systems  Constitutional: Positive for appetite change. Negative for chills and fever.  Respiratory: Negative for shortness of breath.   Cardiovascular: Negative for chest pain.  Gastrointestinal: Positive for abdominal pain, constipation and nausea. Negative for blood in stool and vomiting.  Genitourinary: Negative for decreased urine volume, difficulty urinating, dysuria and flank pain.  Musculoskeletal: Negative for back pain.  Skin: Negative for color change and rash.  Neurological: Negative for dizziness, weakness and numbness.  Hematological: Negative for adenopathy.     Physical Exam Updated Vital Signs BP (!) 143/105 (BP Location: Right Arm)   Pulse (!) 108   Temp 97.9 F (36.6 C) (Oral)   Resp 18   Ht 6' (1.829 m)   Wt 131.5 kg   SpO2 96%   BMI 39.33 kg/m   Physical Exam  Constitutional: He appears well-nourished.  Uncomfortable appearing  HENT:  Mouth/Throat: Oropharynx is clear and moist.  Neck: Normal range of motion. No JVD present.  Cardiovascular: Normal rate and regular rhythm.  Pulmonary/Chest: Effort normal and breath sounds normal. No respiratory distress.  Abdominal: Soft. Normal appearance and bowel sounds are normal. He exhibits no mass. There is tenderness in the suprapubic area and left lower quadrant. There is no rigidity, no rebound, no guarding and no CVA tenderness.  ttp of the middle and LLQ.  No guarding.    Musculoskeletal: Normal range of motion.  Neurological: He is alert. No sensory deficit.  Skin: Skin is warm. Capillary refill takes less than 2 seconds.  Nursing note and vitals reviewed.    ED Treatments / Results  Labs (all labs ordered are listed, but only abnormal results are displayed) Labs Reviewed  COMPREHENSIVE METABOLIC PANEL - Abnormal; Notable for the following  components:      Result Value   Glucose, Bld 113 (*)    Calcium 8.6 (*)    All other components within normal limits  CBC WITH DIFFERENTIAL/PLATELET - Abnormal; Notable for the following components:   HCT 53.8 (*)    All other components within normal limits  LIPASE, BLOOD  URINALYSIS, ROUTINE W REFLEX MICROSCOPIC    EKG None  Radiology Ct Abdomen Pelvis W Contrast  Result Date: 08/12/2018 CLINICAL DATA:  Lower abdominal pain for 3-4 days. Evaluate for diverticulitis. EXAM: CT ABDOMEN AND PELVIS WITH CONTRAST TECHNIQUE: Multidetector CT imaging of the abdomen and pelvis was performed using the standard protocol following bolus administration of intravenous contrast. CONTRAST:  121mL ISOVUE-300 IOPAMIDOL (ISOVUE-300) INJECTION 61% COMPARISON:  CT abdomen pelvis 07/28/2018 FINDINGS: Lower chest: Normal  heart size. Dependent atelectasis within the bilateral lower lobes. No pleural effusion. Hepatobiliary: Liver is diffusely low in attenuation compatible with steatosis. No focal hepatic lesion is identified. Gallbladder is unremarkable. No intrahepatic or extrahepatic biliary ductal dilatation. Pancreas: Unremarkable Spleen: Unremarkable Adrenals/Urinary Tract: Normal adrenal glands. Kidneys are mildly lobular in contour. No hydronephrosis. Urinary bladder is unremarkable. Stomach/Bowel: Focal circumferential wall thickening of the sigmoid colon with pericolonic fat stranding and small amount of fluid (image 75; series 2) at the level of an inflamed diverticuli, most compatible with diverticulitis. No evidence for perforation. Sigmoid colonic diverticulosis. No evidence for upstream obstruction. Appendix is normal. Normal morphology of the stomach. No evidence for small bowel obstruction. Vascular/Lymphatic: Normal caliber abdominal aorta. No retroperitoneal lymphadenopathy. Reproductive: Prostate with mild dystrophic calcifications. Other: Small fat containing right inguinal hernia. Musculoskeletal:  No aggressive or acute appearing osseous lesions. Lumbar and thoracic spine degenerative changes. IMPRESSION: Circumferential wall thickening of the sigmoid colon with pericolonic fat stranding suggestive of acute sigmoid colonic diverticulitis without evidence for perforation. Hepatic steatosis. Electronically Signed   By: Lovey Newcomer M.D.   On: 08/12/2018 10:57    Procedures Procedures (including critical care time)  Medications Ordered in ED Medications  sodium chloride 0.9 % bolus 1,000 mL (has no administration in time range)  ondansetron (ZOFRAN) injection 4 mg (has no administration in time range)  morphine 4 MG/ML injection 4 mg (has no administration in time range)     Initial Impression / Assessment and Plan / ED Course  I have reviewed the triage vital signs and the nursing notes.  Pertinent labs & imaging results that were available during my care of the patient were reviewed by me and considered in my medical decision making (see chart for details).     Pt with recurrent diverticulitis, no perforation or evidence of abscess.  Pt followed by GI,  Dr. Oneida Alar.  Pain has improved after medication.  Well appearing, non-toxic.  He appears appropriate for d/c home.  VSS.  Rx's for cipro and flagyl.  Agrees to close GI f/u next week.  Return precautions discussed.       Final Clinical Impressions(s) / ED Diagnoses   Final diagnoses:  Diverticulitis of sigmoid colon    ED Discharge Orders    None       Kem Parkinson, PA-C 08/12/18 1138    Francine Graven, DO 08/16/18 1534

## 2018-08-16 ENCOUNTER — Encounter: Payer: Self-pay | Admitting: Gastroenterology

## 2018-08-16 ENCOUNTER — Ambulatory Visit: Payer: 59 | Admitting: Gastroenterology

## 2018-08-16 DIAGNOSIS — K760 Fatty (change of) liver, not elsewhere classified: Secondary | ICD-10-CM | POA: Insufficient documentation

## 2018-08-16 DIAGNOSIS — K5732 Diverticulitis of large intestine without perforation or abscess without bleeding: Secondary | ICD-10-CM | POA: Insufficient documentation

## 2018-08-16 DIAGNOSIS — R1011 Right upper quadrant pain: Secondary | ICD-10-CM | POA: Diagnosis not present

## 2018-08-16 NOTE — Patient Instructions (Signed)
1. Complete Cipro and Flagyl.  If you are not significantly better by Monday, please call and let me know.  At that point we will change antibiotics over to Augmentin.   2. Until you are off antibiotics, limit your food to soft foods, no raw fruits or vegetables.  3. After this episode has resolved, you will start on fiber supplements such as Benefiber or fiber choice, 1 dose daily per package instructions.  Gradually add back fiber into your diet. 4. You have fatty liver but your liver blood work is normal.  Try to increase her daily exercise.  Try to lose 10% of your body weight over the next 4 months. 5. If you have recurrent right upper abdominal pain, call me we will plan for gallbladder function tests.    Diverticulitis Diverticulitis is infection or inflammation of small pouches (diverticula) in the colon that form due to a condition called diverticulosis. Diverticula can trap stool (feces) and bacteria, causing infection and inflammation. Diverticulitis may cause severe stomach pain and diarrhea. It may lead to tissue damage in the colon that causes bleeding. The diverticula may also burst (rupture) and cause infected stool to enter other areas of the abdomen. Complications of diverticulitis can include:  Bleeding.  Severe infection.  Severe pain.  Rupture (perforation) of the colon.  Blockage (obstruction) of the colon.  What are the causes? This condition is caused by stool becoming trapped in the diverticula, which allows bacteria to grow in the diverticula. This leads to inflammation and infection. What increases the risk? You are more likely to develop this condition if:  You have diverticulosis. The risk for diverticulosis increases if: ? You are overweight or obese. ? You use tobacco products. ? You do not get enough exercise.  You eat a diet that does not include enough fiber. High-fiber foods include fruits, vegetables, beans, nuts, and whole grains.  What are the  signs or symptoms? Symptoms of this condition may include:  Pain and tenderness in the abdomen. The pain is normally located on the left side of the abdomen, but it may occur in other areas.  Fever and chills.  Bloating.  Cramping.  Nausea.  Vomiting.  Changes in bowel routines.  Blood in your stool.  How is this diagnosed? This condition is diagnosed based on:  Your medical history.  A physical exam.  Tests to make sure there is nothing else causing your condition. These tests may include: ? Blood tests. ? Urine tests. ? Imaging tests of the abdomen, including X-rays, ultrasounds, MRIs, or CT scans.  How is this treated? Most cases of this condition are mild and can be treated at home. Treatment may include:  Taking over-the-counter pain medicines.  Following a clear liquid diet.  Taking antibiotic medicines by mouth.  Rest.  More severe cases may need to be treated at a hospital. Treatment may include:  Not eating or drinking.  Taking prescription pain medicine.  Receiving antibiotic medicines through an IV tube.  Receiving fluids and nutrition through an IV tube.  Surgery.  When your condition is under control, your health care provider may recommend that you have a colonoscopy. This is an exam to look at the entire large intestine. During the exam, a lubricated, bendable tube is inserted into the anus and then passed into the rectum, colon, and other parts of the large intestine. A colonoscopy can show how severe your diverticula are and whether something else may be causing your symptoms. Follow these instructions at  home: Medicines  Take over-the-counter and prescription medicines only as told by your health care provider. These include fiber supplements, probiotics, and stool softeners.  If you were prescribed an antibiotic medicine, take it as told by your health care provider. Do not stop taking the antibiotic even if you start to feel  better.  Do not drive or use heavy machinery while taking prescription pain medicine. General instructions  Follow a full liquid diet or another diet as directed by your health care provider. After your symptoms improve, your health care provider may tell you to change your diet. He or she may recommend that you eat a diet that contains at least 25 g (25 grams) of fiber daily. Fiber makes it easier to pass stool. Healthy sources of fiber include: ? Berries. One cup contains 4-8 grams of fiber. ? Beans or lentils. One half cup contains 5-8 grams of fiber. ? Green vegetables. One cup contains 4 grams of fiber.  Exercise for at least 30 minutes, 3 times each week. You should exercise hard enough to raise your heart rate and break a sweat.  Keep all follow-up visits as told by your health care provider. This is important. You may need a colonoscopy. Contact a health care provider if:  Your pain does not improve.  You have a hard time drinking or eating food.  Your bowel movements do not return to normal. Get help right away if:  Your pain gets worse.  Your symptoms do not get better with treatment.  Your symptoms suddenly get worse.  You have a fever.  You vomit more than one time.  You have stools that are bloody, black, or tarry. Summary  Diverticulitis is infection or inflammation of small pouches (diverticula) in the colon that form due to a condition called diverticulosis. Diverticula can trap stool (feces) and bacteria, causing infection and inflammation.  You are at higher risk for this condition if you have diverticulosis and you eat a diet that does not include enough fiber.  Most cases of this condition are mild and can be treated at home. More severe cases may need to be treated at a hospital.  When your condition is under control, your health care provider may recommend that you have an exam called a colonoscopy. This exam can show how severe your diverticula are and  whether something else may be causing your symptoms. This information is not intended to replace advice given to you by your health care provider. Make sure you discuss any questions you have with your health care provider. Document Released: 06/30/2005 Document Revised: 10/23/2016 Document Reviewed: 10/23/2016 Elsevier Interactive Patient Education  2018 [place].    Fatty Liver Fatty liver, also called hepatic steatosis or steatohepatitis, is a condition in which too much fat has built up in your liver cells. The liver removes harmful substances from your bloodstream. It produces fluids your body needs. It also helps your body use and store energy from the food you eat. In many cases, fatty liver does not cause symptoms or problems. It is often diagnosed when tests are being done for other reasons. However, over time, fatty liver can cause inflammation that may lead to more serious liver problems, such as scarring of the liver (cirrhosis). What are the causes? Causes of fatty liver may include:  Drinking too much alcohol.  Poor nutrition.  Obesity.  Cushing syndrome.  Diabetes.  Hyperlipidemia.  Pregnancy.  Certain drugs.  Poisons.  Some viral infections.  What increases the  risk? You may be more likely to develop fatty liver if you:  Abuse alcohol.  Are pregnant.  Are overweight.  Have diabetes.  Have hepatitis.  Have a high triglyceride level.  What are the signs or symptoms? Fatty liver often does not cause any symptoms. In cases where symptoms develop, they can include:  Fatigue.  Weakness.  Weight loss.  Confusion.  Abdominal pain.  Yellowing of your skin and the white parts of your eyes (jaundice).  Nausea and vomiting.  How is this diagnosed? Fatty liver may be diagnosed by:  Physical exam and medical history.  Blood tests.  Imaging tests, such as an ultrasound, CT scan, or MRI.  Liver biopsy. A small sample of liver tissue is  removed using a needle. The sample is then looked at under a microscope.  How is this treated? Fatty liver is often caused by other health conditions. Treatment for fatty liver may involve medicines and lifestyle changes to manage conditions such as:  Alcoholism.  High cholesterol.  Diabetes.  Being overweight or obese.  Follow these instructions at home:  Eat a healthy diet as directed by your health care provider.  Exercise regularly. This can help you lose weight and control your cholesterol and diabetes. Talk to your health care provider about an exercise plan and which activities are best for you.  Do not drink alcohol.  Take medicines only as directed by your health care provider. Contact a health care provider if: You have difficulty controlling your:  Blood sugar.  Cholesterol.  Alcohol consumption.  Get help right away if:  You have abdominal pain.  You have jaundice.  You have nausea and vomiting. This information is not intended to replace advice given to you by your health care provider. Make sure you discuss any questions you have with your health care provider. Document Released: 11/05/2005 Document Revised: 02/26/2016 Document Reviewed: 01/30/2014 Elsevier Interactive Patient Education  [employer].

## 2018-08-16 NOTE — Assessment & Plan Note (Signed)
Acute uncomplicated sigmoid diverticulitis.  Symptoms improving.  Suggested backing off on his diet, soft low residue.  Once he is completed antibiotics and is feeling better, he will gradually add fiber back into his diet and add a fiber supplement.  If he does not show significant improvement by Monday, he will call and let me know.  At that time consider changing antibiotics.  Return to the office in 6 months or sooner if needed.

## 2018-08-16 NOTE — Progress Notes (Signed)
Primary Care Physician: Redmond School, MD  Primary Gastroenterologist:  Barney Drain, MD   Chief Complaint  Patient presents with  . Diverticulitis    diagnosed 08/12/18 by ED  . Abdominal Pain    across lower abd, constant pain, sharp stabbing pain  . Diarrhea    several times a day, no blood in stool  . Nausea    no vomiting    HPI: Juan Lopez is a 37 y.o. male here for follow-up.  Was seen earlier in the year for colonoscopy due to bowel habit change.  Random colon biopsies were negative.  Left-sided diverticulosis noted.  Ascending colon polyp removed.  Pathology benign.  Patient called back few weeks ago with right upper quadrant pain.  He was advised to go to the emergency department.  Abdominal ultrasound showed normal gallbladder.  Fatty liver.  CT abdomen and pelvis same 25th was unremarkable except for fatty liver.  Patient states his right upper quadrant pain resolved.  Last week he started having left lower quadrant/suprapubic abdominal pain.  After several days he went to the ED.  CT abdomen pelvis with contrast on November 9 showed circumferential wall thickening of the sigmoid colon with pericolonic fat stranding suggestive of acute sigmoid colonic diverticulitis.  He was started on Cipro and Flagyl.  He has completed 4 days of Cipro and Flagyl.  He continues to have constant lower abdominal pain although the episodes of severe debilitating pain are less frequent.  Nausea but no vomiting.  Over the past few days he is having loose stools with mucus.  No blood in the stool or melena.  At baseline his stools occur daily, sometimes several times, alternating between [facility] 1-7.  He continues to eat regular diet.  Notes with eating he has more lower abdominal pain.  He supposed to go back to work today, works swing shifts.  He wants to try to work, does not want to be written out.  No significant upper GI symptoms.  Current Outpatient Medications  Medication Sig  Dispense Refill  . acetaminophen (TYLENOL) 325 MG tablet Take 325-650 mg by mouth every 6 (six) hours as needed for moderate pain. For pain    . ciprofloxacin (CIPRO) 500 MG tablet Take 1 tablet (500 mg total) by mouth 2 (two) times daily. For 10 days 20 tablet 0  . diltiazem (CARDIZEM) 30 MG tablet TAKE 1 TABLET (30 MG TOTAL) BY MOUTH 2 (TWO) TIMES DAILY. 60 tablet 3  . lisinopril (PRINIVIL,ZESTRIL) 5 MG tablet Take 1 tablet (5 mg total) daily by mouth. 90 tablet 3  . metroNIDAZOLE (FLAGYL) 500 MG tablet Take 1 tablet (500 mg total) by mouth 2 (two) times daily. For 10 days 20 tablet 0  . nitroGLYCERIN (NITROSTAT) 0.4 MG SL tablet Place 1 tablet (0.4 mg total) under the tongue every 5 (five) minutes x 3 doses as needed for chest pain. 25 tablet 12  . ondansetron (ZOFRAN) 4 MG tablet Take 1 tablet (4 mg total) by mouth every 6 (six) hours. 12 tablet 0   No current facility-administered medications for this visit.     Allergies as of 08/16/2018 - Review Complete 08/16/2018  Allergen Reaction Noted  . Bee venom Nausea And Vomiting 03/27/2014   Past Surgical History:  Procedure Laterality Date  . CARDIAC CATHETERIZATION [name] 10/16/2016   Procedure: Left Heart Cath and Coronary Angiography;  Surgeon: Peter M Martinique, MD;  Location: [facility];  Service: Cardiovascular;  Laterality: [name];  .  COLONOSCOPY [name] 11/14/2017   Dr. Oneida Alar: 3 mm polyp from proximal ascending colon removed, rectosigmoid colon and sigmoid colon diverticula, external and internal hemorrhoids, random colon biopsies were benign.  Marland Kitchen POLYPECTOMY  11/14/2017   Procedure: POLYPECTOMY;  Surgeon: Danie Binder, MD;  Location: [facility];  Service: Endoscopy;;  Ascending colon (CS)  . TONSILLECTOMY    . WISDOM TOOTH EXTRACTION     AGE 57-25    ROS:  General: Negative for anorexia, weight loss, fever, chills, fatigue, weakness. ENT: Negative for hoarseness, difficulty swallowing , nasal congestion. CV: Negative for  chest pain, angina, palpitations, dyspnea on exertion, peripheral edema.  Respiratory: Negative for dyspnea at rest, dyspnea on exertion, cough, sputum, wheezing.  GI: See history of present illness. GU:  Negative for dysuria, hematuria, urinary incontinence, urinary frequency, nocturnal urination.  Endo: Negative for unusual weight change.    Physical Examination:   BP (!) 152/103   Pulse 97   Temp (!) 97.2 F (36.2 C) (Oral)   Ht 6' (1.829 m)   Wt 292 lb (132.5 kg)   BMI 39.60 kg/m   General: Well-nourished, well-developed in no acute distress.  Accompanied by wife. Eyes: No icterus. Mouth: Oropharyngeal mucosa moist and pink , no lesions erythema or exudate. Lungs: Clear to auscultation bilaterally.  Heart: Regular rate and rhythm, no murmurs rubs or gallops.  Abdomen: Bowel sounds are normal, mild suprapubic tenderness, nondistended, no hepatosplenomegaly or masses, no abdominal bruits or hernia , no rebound or guarding.   Extremities: No lower extremity edema. No clubbing or deformities. Neuro: Alert and oriented x 4   Skin: Warm and dry, no jaundice.   Psych: Alert and cooperative, normal mood and affect.  Labs:  Lab Results  Component Value Date   WBC 10.3 08/12/2018   HGB 16.9 08/12/2018   HCT 53.8 (H) 08/12/2018   MCV 94.1 08/12/2018   PLT 262 08/12/2018   Lab Results  Component Value Date   CREATININE 0.73 08/12/2018   BUN 10 08/12/2018   NA 136 08/12/2018   K 4.3 08/12/2018   CL 103 08/12/2018   CO2 25 08/12/2018   Lab Results  Component Value Date   ALT 44 08/12/2018   AST 27 08/12/2018   ALKPHOS 82 08/12/2018   BILITOT 1.1 08/12/2018   Lab Results  Component Value Date   LIPASE 26 08/12/2018    Imaging Studies: US Abdomen Complete  Result Date: 07/28/2018 CLINICAL DATA:  Right upper quadrant pain and nausea. EXAM: ABDOMEN ULTRASOUND COMPLETE COMPARISON:  CT scan abdomen dated 08/28/2017 FINDINGS: Gallbladder: No gallstones or wall thickening  visualized. No sonographic Murphy sign noted by sonographer. Common bile duct: Diameter: 5.4 mm, normal Liver: No focal lesion identified. Diffuse increased echogenicity consistent with hepatic steatosis, as previously demonstrated on the CT scan of 2018. Portal vein is patent on color Doppler imaging with normal direction of blood flow towards the liver. IVC: No abnormality visualized. Pancreas: Visualized portion unremarkable. The majority of the pancreas is obscured by bowel. Spleen: Size and appearance within normal limits. Right Kidney: Length: 12.3 cm. Echogenicity within normal limits. No mass or hydronephrosis visualized. Left Kidney: Length: 13.1 cm. Echogenicity within normal limits. No mass or hydronephrosis visualized. Abdominal aorta: No aneurysm visualized. Other findings: None. IMPRESSION: 1. No acute abnormality. 2. Chronic appendix steatosis. 3. Suboptimal visualization of the pancreas due to bowel gas. Electronically Signed   By: Lorriane Shire M.D.   On: 07/28/2018 13:58   Ct Abdomen Pelvis W Contrast  Result Date: 08/12/2018 CLINICAL DATA:  Lower abdominal pain for 3-4 days. Evaluate for diverticulitis. EXAM: CT ABDOMEN AND PELVIS WITH CONTRAST TECHNIQUE: Multidetector CT imaging of the abdomen and pelvis was performed using the standard protocol following bolus administration of intravenous contrast. CONTRAST:  132mL ISOVUE-300 IOPAMIDOL (ISOVUE-300) INJECTION 61% COMPARISON:  CT abdomen pelvis 07/28/2018 FINDINGS: Lower chest: Normal heart size. Dependent atelectasis within the bilateral lower lobes. No pleural effusion. Hepatobiliary: Liver is diffusely low in attenuation compatible with steatosis. No focal hepatic lesion is identified. Gallbladder is unremarkable. No intrahepatic or extrahepatic biliary ductal dilatation. Pancreas: Unremarkable Spleen: Unremarkable Adrenals/Urinary Tract: Normal adrenal glands. Kidneys are mildly lobular in contour. No hydronephrosis. Urinary bladder is  unremarkable. Stomach/Bowel: Focal circumferential wall thickening of the sigmoid colon with pericolonic fat stranding and small amount of fluid (image 75; series 2) at the level of an inflamed diverticuli, most compatible with diverticulitis. No evidence for perforation. Sigmoid colonic diverticulosis. No evidence for upstream obstruction. Appendix is normal. Normal morphology of the stomach. No evidence for small bowel obstruction. Vascular/Lymphatic: Normal caliber abdominal aorta. No retroperitoneal lymphadenopathy. Reproductive: Prostate with mild dystrophic calcifications. Other: Small fat containing right inguinal hernia. Musculoskeletal: No aggressive or acute appearing osseous lesions. Lumbar and thoracic spine degenerative changes. IMPRESSION: Circumferential wall thickening of the sigmoid colon with pericolonic fat stranding suggestive of acute sigmoid colonic diverticulitis without evidence for perforation. Hepatic steatosis. Electronically Signed   By: Lovey Newcomer M.D.   On: 08/12/2018 10:57   Ct Abdomen Pelvis W Contrast  Result Date: 07/28/2018 CLINICAL DATA:  Acute right upper quadrant abdominal pain. EXAM: CT ABDOMEN AND PELVIS WITH CONTRAST TECHNIQUE: Multidetector CT imaging of the abdomen and pelvis was performed using the standard protocol following bolus administration of intravenous contrast. CONTRAST:  141mL ISOVUE-300 IOPAMIDOL (ISOVUE-300) INJECTION 61% COMPARISON:  CT scan of August 28, 2017. FINDINGS: Lower chest: No acute abnormality. Hepatobiliary: No gallstones or biliary dilatation is noted. Fatty infiltration of the liver is noted. Pancreas: Unremarkable. No pancreatic ductal dilatation or surrounding inflammatory changes. Spleen: Normal in size without focal abnormality. Adrenals/Urinary Tract: Adrenal glands are unremarkable. Kidneys are normal, without renal calculi, focal lesion, or hydronephrosis. Bladder is unremarkable. Stomach/Bowel: Stomach is within normal limits.  Appendix appears normal. No evidence of bowel wall thickening, distention, or inflammatory changes. Sigmoid diverticulosis is noted without inflammation. Vascular/Lymphatic: No significant vascular findings are present. No enlarged abdominal or pelvic lymph nodes. Reproductive: Prostate is unremarkable. Other: No abdominal wall hernia or abnormality. No abdominopelvic ascites. Musculoskeletal: No acute or significant osseous findings. IMPRESSION: Fatty infiltration of the liver. Sigmoid diverticulosis without inflammation. No acute abnormality seen in the abdomen or pelvis. Electronically Signed   By: Marijo Conception, M.D.   On: 07/28/2018 15:00   Dg Abd Acute W/chest  Result Date: 07/28/2018 CLINICAL DATA:  Abdominal pain.  Nausea.  History of diverticulitis. EXAM: DG ABDOMEN ACUTE W/ 1V CHEST COMPARISON:  CT scan of the abdomen dated 08/28/2017 and chest x-ray dated 10/16/2016 FINDINGS: There is no evidence of dilated bowel loops or free intraperitoneal air. No radiopaque calculi or other significant radiographic abnormality is seen. Heart size and mediastinal contours are within normal limits. Both lungs are clear. IMPRESSION: Negative abdominal radiographs.  No acute cardiopulmonary disease. Electronically Signed   By: Lorriane Shire M.D.   On: 07/28/2018 13:20

## 2018-08-16 NOTE — Assessment & Plan Note (Signed)
Patient reports intermittent right upper quadrant pain.  Currently asymptomatic.  LFTs been normal.  No evidence of cholelithiasis or acute cholecystitis.  Cannot exclude biliary dyskinesia.  If recurrent symptoms, consider HIDA with fatty meal challenge.  Patient will let me know if any further problems.

## 2018-08-16 NOTE — Assessment & Plan Note (Signed)
Currently LFTs are normal.  Recommend dietary changes, increasing daily exercise, weight reduction.  Discussed that fatty liver can progress to cirrhosis over time.  Important that he make some lifestyle changes now.  Handout provided as well.  Return to the office in 6 months or sooner if needed.

## 2018-08-17 NOTE — Progress Notes (Signed)
cc'ed to pcp °

## 2018-08-22 ENCOUNTER — Telehealth: Payer: Self-pay | Admitting: Gastroenterology

## 2018-08-22 MED ORDER — AMOXICILLIN-POT CLAVULANATE 875-125 MG PO TABS: 1.0000 | ORAL_TABLET | Freq: Three times a day (TID) | ORAL | 0 refills | Status: AC

## 2018-08-22 NOTE — Telephone Encounter (Signed)
PT will be completing Cipro and Flagyl tomorrow for Diverticulitis.  He said the last couple of days his pain has gotten pretty rough at times. The pain is intermittent and has eased off a little. It is mostly in his lower abdomen right below belly button.  He is not able to each much, when he does he really has pain. He is been sticking to the diet plan as planned at [facility].  He had N/V just one day. His pain does keep him awake at night some. Magda Paganini, Please advise!

## 2018-08-22 NOTE — Telephone Encounter (Signed)
Pt is aware.  

## 2018-08-22 NOTE — Telephone Encounter (Signed)
Pt was seen 11/13 and is still having problems and wanted to know what we recommend for his pain. [phone]

## 2018-08-22 NOTE — Addendum Note (Signed)
Addended by: Mahala Menghini on: 08/22/2018 12:52 PM   Modules accepted: Orders

## 2018-08-22 NOTE — Telephone Encounter (Addendum)
Will extend antibiotics but change to augmentin tid for 10 days.   Continue liquid/soft diet. Low fiber for now.  If he is not better in the next 48 hours OR his symptoms get worse he will need repeat CT scan to rule out complication from diverticulitis.   IF HE HAS FEVER, WORSENING ABDOMINAL PAIN, WOULD CONSIDER GOING TO ED FOR REEVALUATION IE CT

## 2019-02-14 ENCOUNTER — Other Ambulatory Visit: Payer: Self-pay

## 2019-02-14 ENCOUNTER — Encounter: Payer: Self-pay | Admitting: Gastroenterology

## 2019-02-14 ENCOUNTER — Ambulatory Visit (INDEPENDENT_AMBULATORY_CARE_PROVIDER_SITE_OTHER): Payer: 59 | Admitting: Gastroenterology

## 2019-02-14 DIAGNOSIS — K529 Noninfective gastroenteritis and colitis, unspecified: Secondary | ICD-10-CM | POA: Diagnosis not present

## 2019-02-14 DIAGNOSIS — K760 Fatty (change of) liver, not elsewhere classified: Secondary | ICD-10-CM | POA: Diagnosis not present

## 2019-02-14 MED ORDER — DICYCLOMINE HCL 10 MG PO CAPS: 10.0000 mg | ORAL_CAPSULE | Freq: Three times a day (TID) | ORAL | 3 refills | Status: DC

## 2019-02-14 NOTE — Patient Instructions (Addendum)
1. Go to the lab at your convenience.  We will check for celiac disease and recheck your liver labs due to fatty liver. 2. Trial of dicyclomine, take 1 capsule up to 4 times daily to prevent/treat abdominal cramps and diarrhea.  You may take as needed only or you can take 30 to 60 minutes prior to meal to prevent urgency for bowel movements. 3. If no improvement in your symptoms, please let us know.  Otherwise we will see you back in 1 year. 4. Plan for next colonoscopy in 2024.

## 2019-02-14 NOTE — Progress Notes (Signed)
Primary Care Physician:  Redmond School, MD Primary GI:  Barney Drain, MD   Patient Location: Home  Provider Location: [facility] office  Reason for Visit: Follow-up diverticulitis  Persons present on the virtual encounter, with roles: Patient, myself (provider), Christ Kick, CMA (updated meds and allergies)  Total time (minutes) spent on medical discussion: 15 minutes  Due to COVID-19, visit was conducted using [email or web address] method.  Visit was requested by patient.  Virtual Visit via [email or web address]  I connected with Juan Lopez on 02/14/19 at  2:00 PM EDT by [email or web address] and verified that I am speaking with the correct person using two identifiers.   I discussed the limitations, risks, security and privacy concerns of performing an evaluation and management service by telephone/video and the availability of in person appointments. I also discussed with the patient that there may be a patient responsible charge related to this service. The patient expressed understanding and agreed to proceed.  Chief Complaint  Patient presents with  . Diverticulitis    HPI:   Juan Lopez is a 38 y.o. male who presents for virtual visit regarding follow-up of diverticulitis.  Patient was last seen in November.  He was diagnosed with diverticulitis by CT at that time.  He also had CT documented diverticulitis in November 2018.  Last colonoscopy February 2019, 3 mm polyp removed from proximal ascending colon, noted to have rectosigmoid colon and sigmoid colon diverticula, external and internal hemorrhoids, random colon biopsies benign.  Next colonoscopy planned for 2024.  Family history significant for colon polyps in mother, required colectomy.  Patient has been doing reasonably well since his last office visit.  He has had some days with some mild lower abdominal discomfort associated with mild constipation.  Would take a stool softener and get relief.  Symptoms will go away within 24 to 48 hours and  then he would be back to baseline diarrhea, 6-8 stools daily.  This is been going on for several years.  Occasional nocturnal diarrhea.  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  Also notes lactose intolerance but will have diarrhea at times even if he does not consume dairy.  Complains of postprandial urgency and occasional incontinence.  No weight loss.  No upper GI complaints.    Current Outpatient Medications  Medication Sig Dispense Refill  . acetaminophen (TYLENOL) 325 MG tablet Take 325-650 mg by mouth every 6 (six) hours as needed for moderate pain. For pain    . lisinopril (PRINIVIL,ZESTRIL) 5 MG tablet Take 1 tablet (5 mg total) daily by mouth. 90 tablet 3  . nitroGLYCERIN (NITROSTAT) 0.4 MG SL tablet Place 1 tablet (0.4 mg total) under the tongue every 5 (five) minutes x 3 doses as needed for chest pain. 25 tablet 12  . ondansetron (ZOFRAN) 4 MG tablet Take 1 tablet (4 mg total) by mouth every 6 (six) hours. 12 tablet 0   No current facility-administered medications for this visit.     ROS:  General: Negative for anorexia, weight loss, fever, chills, fatigue, weakness. Eyes: Negative for vision changes.  ENT: Negative for hoarseness, difficulty swallowing , nasal congestion. CV: Negative for chest pain, angina, palpitations, dyspnea on exertion, peripheral edema.  Respiratory: Negative for dyspnea at rest, dyspnea on exertion, cough, sputum, wheezing.  GI: See history of present illness. GU:  Negative for dysuria, hematuria, urinary incontinence, urinary frequency, nocturnal urination.  MS: Negative for joint pain, low back pain.  Derm: Negative for rash or itching.  Neuro: Negative for weakness, abnormal sensation, seizure, frequent headaches, memory loss, confusion.  Psych: Negative for anxiety, depression, suicidal ideation, hallucinations.  Endo: Negative for unusual weight change.  Heme: Negative for bruising or bleeding. Allergy: Negative for rash or hives.    Observations/Objective: Pleasant well-nourished well-developed male in no acute distress.  Lab Results  Component Value Date   CREATININE 0.73 08/12/2018   BUN 10 08/12/2018   NA 136 08/12/2018   K 4.3 08/12/2018   CL 103 08/12/2018   CO2 25 08/12/2018   Lab Results  Component Value Date   ALT 44 08/12/2018   AST 27 08/12/2018   ALKPHOS 82 08/12/2018   BILITOT 1.1 08/12/2018   Lab Results  Component Value Date   WBC 10.3 08/12/2018   HGB 16.9 08/12/2018   HCT 53.8 (H) 08/12/2018   MCV 94.1 08/12/2018   PLT 262 08/12/2018   No results found for: TSH   Assessment and Plan: Pleasant 38 year old gentleman presenting for follow-up.  He has a history of diverticulitis, chronic diarrhea and fatty liver.  Continues to have 6-8 loose stools daily, postprandial fecal urgency.  In part due to lactose intolerance but likely an element of IBS.  Trial of Bentyl 10 mg q. before meals nightly as needed.  Would be reasonable to screen for celiac disease.  Will go to the lab anytime the next couple months.  We will also recheck his LFTs due to fatty liver.  Denies any significant recurrence of diverticulitis.  Some vague abdominal pain which is self-limiting as outlined above.  He can try Bentyl for that as well, hold for constipation however.  Currently up-to-date on colonoscopy, next one due in 2024.  We will have patient return for an office visit in 1 year, he should call sooner if he is having progressive/persistent symptoms.   Follow Up Instructions:    I discussed the assessment and treatment plan with the patient. The patient was provided an opportunity to ask questions and all were answered. The patient agreed with the plan and demonstrated an understanding of the instructions. AVS mailed to patient's home address.   The patient was advised to call back or seek an in-person evaluation if the symptoms worsen or if the condition fails to improve as anticipated.  I provided 15 minutes  of virtual face-to-face time during this encounter.   Neil Crouch, PA-C

## 2019-02-15 NOTE — Progress Notes (Signed)
CC'D TO PCP °

## 2019-03-01 ENCOUNTER — Telehealth: Payer: Self-pay | Admitting: Gastroenterology

## 2019-03-01 MED ORDER — CIPROFLOXACIN HCL 500 MG PO TABS: 500.0000 mg | ORAL_TABLET | Freq: Two times a day (BID) | ORAL | 0 refills | Status: AC

## 2019-03-01 MED ORDER — METRONIDAZOLE 500 MG PO TABS: 500.0000 mg | ORAL_TABLET | Freq: Three times a day (TID) | ORAL | 0 refills | Status: AC

## 2019-03-01 MED ORDER — ONDANSETRON HCL 4 MG PO TABS: 4.0000 mg | ORAL_TABLET | Freq: Four times a day (QID) | ORAL | 0 refills | Status: DC | PRN

## 2019-03-01 NOTE — Telephone Encounter (Signed)
Pt is aware.  

## 2019-03-01 NOTE — Telephone Encounter (Signed)
Pt is aware. He said one of the antibiotics will usually cause him to have nausea and wanted to know if [facility] could send in something for nausea to [facility].

## 2019-03-01 NOTE — Telephone Encounter (Addendum)
PT said he had a terrible pain in the lower abdomen this morning and it brought tears to his eyes. It has never happened before. It was a stabbing pain. He took one of his wife's pain pills that she had when she had some surgery in Feb. It eased off after the medication. But still hurts some. He usually has diarrhea when he has BM's and on Tuesday he had 6-8 episodes. He did not have any Bm's yesterday. He feels like he has to go many times and cannot do anything. He was advised to go to the ED if it worsens. He is aware I am leaving at noon but will send this to provider. Sending to Neil Crouch, PA to advise!

## 2019-03-01 NOTE — Addendum Note (Signed)
Addended by: Mahala Menghini on: 03/01/2019 01:47 PM   Modules accepted: Orders

## 2019-03-01 NOTE — Telephone Encounter (Signed)
Patient has h/o diverticulitis. Would be reasonable to provide antibiotic therapy but if persistent symptoms then would consider repeat CT.   If symptoms worsen, fever, pain worsenes, go to ED.   Clear liquid diet for next 24-48 hours. Low fiber diet until off antibiotics  [facility]

## 2019-03-01 NOTE — Telephone Encounter (Signed)
[phone] PATIENT CALLED AND SAID THAT HE HAD A TERRIBLE PAIN IN HIS STOMACH AT WORK AND IT FEELS LIKE HE IS GETTING BACKED UP AGAIN

## 2019-03-01 NOTE — Addendum Note (Signed)
Addended by: Mahala Menghini on: 03/01/2019 12:44 PM   Modules accepted: Orders

## 2019-03-01 NOTE — Telephone Encounter (Signed)
rx for zofran sent.

## 2019-03-04 ENCOUNTER — Encounter (HOSPITAL_COMMUNITY): Payer: Self-pay

## 2019-03-04 ENCOUNTER — Emergency Department (HOSPITAL_COMMUNITY)
Admission: EM | Admit: 2019-03-04 | Discharge: 2019-03-04 | Disposition: A | Discharge status: Home / Self Care | Payer: 59 | Attending: Emergency Medicine | Admitting: Emergency Medicine

## 2019-03-04 ENCOUNTER — Emergency Department (HOSPITAL_COMMUNITY): Payer: 59

## 2019-03-04 ENCOUNTER — Other Ambulatory Visit: Payer: Self-pay

## 2019-03-04 DIAGNOSIS — F17210 Nicotine dependence, cigarettes, uncomplicated: Secondary | ICD-10-CM | POA: Insufficient documentation

## 2019-03-04 DIAGNOSIS — I2510 Atherosclerotic heart disease of native coronary artery without angina pectoris: Secondary | ICD-10-CM | POA: Insufficient documentation

## 2019-03-04 DIAGNOSIS — I10 Essential (primary) hypertension: Secondary | ICD-10-CM | POA: Diagnosis not present

## 2019-03-04 DIAGNOSIS — Y9389 Activity, other specified: Secondary | ICD-10-CM | POA: Insufficient documentation

## 2019-03-04 DIAGNOSIS — Y998 Other external cause status: Secondary | ICD-10-CM | POA: Insufficient documentation

## 2019-03-04 DIAGNOSIS — S63501A Unspecified sprain of right wrist, initial encounter: Secondary | ICD-10-CM | POA: Diagnosis not present

## 2019-03-04 DIAGNOSIS — Y92838 Other recreation area as the place of occurrence of the external cause: Secondary | ICD-10-CM | POA: Insufficient documentation

## 2019-03-04 DIAGNOSIS — I252 Old myocardial infarction: Secondary | ICD-10-CM | POA: Insufficient documentation

## 2019-03-04 DIAGNOSIS — S6991XA Unspecified injury of right wrist, hand and finger(s), initial encounter: Secondary | ICD-10-CM | POA: Diagnosis present

## 2019-03-04 MED ORDER — IBUPROFEN 800 MG PO TABS
800.0000 mg | ORAL_TABLET | Freq: Once | ORAL | Status: AC
  Administered 2019-03-04: 800 mg via ORAL
  Filled 2019-03-04: qty 1

## 2019-03-04 MED ORDER — ACETAMINOPHEN 500 MG PO TABS
1000.0000 mg | ORAL_TABLET | Freq: Once | ORAL | Status: AC
  Administered 2019-03-04: 1000 mg via ORAL
  Filled 2019-03-04: qty 2

## 2019-03-04 MED ORDER — TRAMADOL HCL 50 MG PO TABS: 50.0000 mg | ORAL_TABLET | Freq: Four times a day (QID) | ORAL | 0 refills | Status: DC | PRN
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
## 2019-03-04 NOTE — Discharge Instructions (Signed)
Your blood pressure is slightly elevated at 149/98.  Please have this rechecked soon. Your x-ray is read by the radiologist as negative for fracture or dislocation.  Please use the wrist splint over the next 5 to 7 days.  Apply ice over the next couple of days when possible.  Use 1000 mg of Tylenol with breakfast, lunch, dinner, and at bedtime.  May use Ultram for more severe pain.  Please keep your wrist elevated as much as possible to improve pain and swelling.  Please see Dr. Aline Brochure for orthopedic management if this is not improving in the next 5 to 7 days.
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
## 2019-07-30 ENCOUNTER — Telehealth: Payer: Self-pay | Admitting: Gastroenterology

## 2019-07-30 MED ORDER — METRONIDAZOLE 500 MG PO TABS: 500.0000 mg | ORAL_TABLET | Freq: Three times a day (TID) | ORAL | 0 refills | Status: DC

## 2019-07-30 MED ORDER — CIPROFLOXACIN HCL 500 MG PO TABS: 500.0000 mg | ORAL_TABLET | Freq: Two times a day (BID) | ORAL | 0 refills | Status: DC

## 2019-07-30 NOTE — Telephone Encounter (Signed)
Please find out more info about BMs. Previous note said he was having diarrhea but now saying he can't go?  Would recommend clear liquid diet for now.  Needs evaluation.  Will start Cipro/Flagyl in interim.

## 2019-07-30 NOTE — Telephone Encounter (Signed)
Patient needs OV. We have treated him empirically back in 02/2019 for diverticulitis when he called in. I think he should have face to face evaluation at this time. There are two openings tomorrow.

## 2019-07-30 NOTE — Telephone Encounter (Signed)
notedl

## 2019-07-30 NOTE — Telephone Encounter (Signed)
Pt is coming in tomorrow at 8am to se EG, but would like for the nurse to call him as well. He wants to know what he should do in the meantime because he is hurting, can't eat and can't go to the bathroom. [phone]

## 2019-07-30 NOTE — Telephone Encounter (Signed)
Pt has questions about which medication he needs called in for his diverticulitis, [phone] He uses [facility] in [place]

## 2019-07-30 NOTE — Addendum Note (Signed)
Addended by: Mahala Menghini on: 07/30/2019 01:51 PM   Modules accepted: Orders

## 2019-07-30 NOTE — Telephone Encounter (Addendum)
I called him and he again said that he had the diarrhea Fri, Sat and Sun, watery stools. Today the stools are more mucous like. He is not constipated.  He is aware to do the clear liquid diet and pick up the medications and start and to keep appointment for tomorrow morning.

## 2019-07-30 NOTE — Telephone Encounter (Signed)
Pt said he has had diarrhea since Friday at about 5 times a day. He has abdominal pain in his lower abdomen, in the middle, not to one side or the other.  He has not had blood in stool. No nausea or vomiting. He did not have a fever this morning when he went to work, feels hot and clammy now but said that is due to his job and running up and down steps.  He said the pain feels like he did previously when he had diverticulitis. Last had Doxy visit in May this year with Neil Crouch, PA. Magda Paganini, please advise!

## 2019-07-31 ENCOUNTER — Ambulatory Visit: Payer: 59 | Admitting: Nurse Practitioner

## 2019-07-31 ENCOUNTER — Ambulatory Visit (HOSPITAL_COMMUNITY)
Admission: RE | Admit: 2019-07-31 | Discharge: 2019-07-31 | Disposition: A | Discharge status: Home / Self Care | Payer: 59 | Source: Ambulatory Visit | Attending: Nurse Practitioner | Admitting: Nurse Practitioner

## 2019-07-31 ENCOUNTER — Encounter: Payer: Self-pay | Admitting: Nurse Practitioner

## 2019-07-31 ENCOUNTER — Other Ambulatory Visit (HOSPITAL_COMMUNITY)
Admission: RE | Admit: 2019-07-31 | Discharge: 2019-07-31 | Disposition: A | Discharge status: Home / Self Care | Payer: 59 | Source: Ambulatory Visit | Attending: Nurse Practitioner | Admitting: Nurse Practitioner

## 2019-07-31 ENCOUNTER — Other Ambulatory Visit: Payer: Self-pay

## 2019-07-31 VITALS — BP 144/96 | HR 90 | Temp 97.0°F | Ht 72.0 in | Wt 268.2 lb

## 2019-07-31 DIAGNOSIS — R1030 Lower abdominal pain, unspecified: Secondary | ICD-10-CM

## 2019-07-31 DIAGNOSIS — K5732 Diverticulitis of large intestine without perforation or abscess without bleeding: Secondary | ICD-10-CM | POA: Diagnosis present

## 2019-07-31 DIAGNOSIS — R109 Unspecified abdominal pain: Secondary | ICD-10-CM | POA: Insufficient documentation

## 2019-07-31 LAB — CBC WITH DIFFERENTIAL/PLATELET
Abs Immature Granulocytes: 0.03 10*3/uL (ref 0.00–0.07)
Basophils Absolute: 0 10*3/uL (ref 0.0–0.1)
Basophils Relative: 0 %
Eosinophils Absolute: 0.2 10*3/uL (ref 0.0–0.5)
Eosinophils Relative: 2 %
HCT: 55 % — ABNORMAL HIGH (ref 39.0–52.0)
Hemoglobin: 17.6 g/dL — ABNORMAL HIGH (ref 13.0–17.0)
Immature Granulocytes: 0 %
Lymphocytes Relative: 23 %
Lymphs Abs: 2.6 10*3/uL (ref 0.7–4.0)
MCH: 30.2 pg (ref 26.0–34.0)
MCHC: 32 g/dL (ref 30.0–36.0)
MCV: 94.3 fL (ref 80.0–100.0)
Monocytes Absolute: 0.8 10*3/uL (ref 0.1–1.0)
Monocytes Relative: 7 %
Neutro Abs: 7.6 10*3/uL (ref 1.7–7.7)
Neutrophils Relative %: 68 %
Platelets: 278 10*3/uL (ref 150–400)
RBC: 5.83 MIL/uL — ABNORMAL HIGH (ref 4.22–5.81)
RDW: 13.1 % (ref 11.5–15.5)
WBC: 11.1 10*3/uL — ABNORMAL HIGH (ref 4.0–10.5)
nRBC: 0 % (ref 0.0–0.2)

## 2019-07-31 LAB — LIPASE, BLOOD: Lipase: 19 U/L (ref 11–51)

## 2019-07-31 LAB — COMPREHENSIVE METABOLIC PANEL
ALT: 25 U/L (ref 0–44)
AST: 16 U/L (ref 15–41)
Albumin: 4 g/dL (ref 3.5–5.0)
Alkaline Phosphatase: 87 U/L (ref 38–126)
Anion gap: 10 (ref 5–15)
BUN: 9 mg/dL (ref 6–20)
CO2: 27 mmol/L (ref 22–32)
Calcium: 9 mg/dL (ref 8.9–10.3)
Chloride: 99 mmol/L (ref 98–111)
Creatinine, Ser: 0.89 mg/dL (ref 0.61–1.24)
GFR calc Af Amer: >60 mL/min (ref >60)
GFR calc non Af Amer: >60 mL/min (ref >60)
Glucose, Bld: 94 mg/dL (ref 70–99)
Potassium: 4.5 mmol/L (ref 3.5–5.1)
Sodium: 136 mmol/L (ref 135–145)
Total Bilirubin: 0.8 mg/dL (ref 0.3–1.2)
Total Protein: 7.3 g/dL (ref 6.5–8.1)

## 2019-07-31 MED ORDER — IOHEXOL 300 MG/ML  SOLN
100.0000 mL | Freq: Once | INTRAMUSCULAR | Status: AC | PRN
  Administered 2019-07-31: 100 mL via INTRAVENOUS

## 2019-07-31 MED ORDER — IOHEXOL 9 MG/ML PO SOLN: 500.0000 mL | ORAL | Status: DC

## 2019-07-31 NOTE — Patient Instructions (Signed)
PA for stat CT abd/pelvis submitted via [facility] website. Notification# [id number], valid for 45 calendar days expiring on 09/14/19.

## 2019-07-31 NOTE — Patient Instructions (Signed)
Your health issues we discussed today were:   History of diverticulitis with abdominal pain: 1. Have your labs completed when you are able to 2. We will help schedule your CT study of your abdomen for today 3. Continue clear liquid diet, low fiber diet for now 4. Continue your antibiotics and take them to completion 5. As you start to feel better you can advance her diet to soft foods and eventually regular diet when your symptoms have resolved 6. Call us if you have any worsening or severe symptoms  Overall I recommend:  1. Continue your other current medications 2. Return for follow-up in 4 weeks 3. Call us if you have any questions or concerns.   Because of recent events of COVID-19 ("Coronavirus"), follow CDC recommendations:  Wash your hand frequently Avoid touching your face Stay away from people who are sick If you have symptoms such as fever, cough, shortness of breath then call your healthcare provider for further guidance If you are sick, STAY AT HOME unless otherwise directed by your healthcare provider. Follow directions from state and national officials regarding staying safe   At [facility] we value your feedback. You may receive a survey about your visit today. Please share your experience as we strive to create trusting relationships with our patients to provide genuine, compassionate, quality care.  We appreciate your understanding and patience as we review any laboratory studies, imaging, and other diagnostic tests that are ordered as we care for you. Our office policy is 5 business days for review of these results, and any emergent or urgent results are addressed in a timely manner for your best interest. If you do not hear from our office in 1 week, please contact us.   We also encourage the use of MyChart, which contains your medical information for your review as well. If you are not enrolled in this feature, an access code is on this after visit  summary for your convenience. Thank you for allowing [place] to be involved in your care.  It was great to see you today!  I hope you have a great Fall and Happy Halloween!!

## 2019-07-31 NOTE — Assessment & Plan Note (Signed)
Lower abdominal pain that began on Friday and worsened on Saturday.  Has been on clear liquids since.  Presumed diverticulitis with empiric antibiotic started yesterday.  We will check stat CT and labs as per above.  I feel his abdominal pain is likely due to recurrent diverticulitis.  He may eventually need a surgical referral to discuss options.  Follow-up in 4 weeks.

## 2019-07-31 NOTE — Progress Notes (Addendum)
Referring Provider: Redmond School, MD Primary Care Physician:  Redmond School, MD Primary GI:  Dr. Oneida Alar  Chief Complaint  Patient presents with  . Abdominal Pain    across lower abd x friday. Started ABX last night    HPI:   Juan Lopez is a 38 y.o. male who presents for diarrhea and diverticulitis.  Patient was last seen in our office 02/14/2019 for fatty liver, chronic diarrhea, diverticulitis.  The patient's last visit was a virtual office visit due to COVID-19/coronavirus pandemic.  Most recent CT documented diverticulitis in November 2019.  Additional episodes of November 2018.  Colonoscopy up-to-date February 2019 with a single polyp, diverticular, hemorrhoids, benign random colon biopsies.  Next due 2024.  Family history of significant polyps in his mother required colectomy.  At his last visit he noted intermittent lower abdominal discomfort with mild constipation for which she would take a stool softener and get relief.  Symptoms typically resolve within 1 to 2 days and then reverts back to baseline diarrhea with 6-8 stools a day.  This is been ongoing for several years.  Occasional nocturnal diarrhea, query postinfectious IBS.  Lactose intolerant noted as well.  Also notes postprandial urgency and occasional incontinence.  No other GI complaints.  Recommended updated labs, dicyclomine 4 times a day as needed, notify us for nonimprovement.  He did not complete his labs as recommended (tissue transglutaminase IgA, IgA, hepatic function panel).  He called our office 03/01/2019 with significant worsening of abdominal pain and no bowel movement.  Recommended clear liquid diet for the next 24 to 48 hours, temporary low fiber diet, prophylactic [facility], consider repeat CT if persistent symptoms.  If symptoms worsen proceed to the emergency department.  He called our office again yesterday with abdominal pain that feels like diverticulitis.  Recommend a  face-to-face visit which was scheduled for today.  Also recommended clear liquid diet, prophylactic Cipro/Flagyl in the interim.  Today he states he's doing ok overall. Having sharp abdominal pain across the lower abdomen. Started abx last night, pain not much better today. Denies N/V. Has been "eating betting so I can lose weight. Has been having a bowel movement 2-3 times a day consistent with [place] 4 and no straining. Since his pain started he has not had a bowel movement. Currently eating clear liquids since pain started. Denies fever, chills. Denies hematochezia, melena. Stools currently "are straight mucus." Denies URI or flu-like symptoms. Denies loss of sense of taste or smell. Denies chest pain, dyspnea, dizziness, lightheadedness, syncope, near syncope. Denies any other upper or lower GI symptoms.  Has had 50 lb recent intentional weight loss. Subsequently was taken off BP medications. Claims "white coat syndrome" for elevated BO today. At work physical BP 120's/70's.  Past Medical History:  Diagnosis Date  . Acute myopericarditis    a. dx 10/2016  . CAD (coronary artery disease)    a. LHC 10/2016 with mild luminal irregularities. Placed on statin   . Diverticulitis   . Family history of early CAD   . HTN (hypertension)     Past Surgical History:  Procedure Laterality Date  . CARDIAC CATHETERIZATION [name] 10/16/2016   Procedure: Left Heart Cath and Coronary Angiography;  Surgeon: Peter M Martinique, MD;  Location: [place] [facility];  Service: Cardiovascular;  Laterality: [name];  . COLONOSCOPY [name] 11/14/2017   Dr. Oneida Alar: 3 mm polyp from proximal ascending colon removed, rectosigmoid colon and sigmoid colon diverticula, external and internal hemorrhoids, random  colon biopsies were benign.  Marland Kitchen POLYPECTOMY  11/14/2017   Procedure: POLYPECTOMY;  Surgeon: Danie Binder, MD;  [facility];  Service: Endoscopy;;  Ascending colon (CS)  . TONSILLECTOMY    . WISDOM TOOTH EXTRACTION      AGE 80-25    Current Outpatient Medications  Medication Sig Dispense Refill  . acetaminophen (TYLENOL) 325 MG tablet Take 325-650 mg by mouth every 6 (six) hours as needed for moderate pain. For pain    . ciprofloxacin (CIPRO) 500 MG tablet Take 1 tablet (500 mg total) by mouth 2 (two) times daily for 10 days. 20 tablet 0  . metroNIDAZOLE (FLAGYL) 500 MG tablet Take 1 tablet (500 mg total) by mouth 3 (three) times daily for 10 days. 30 tablet 0  . nitroGLYCERIN (NITROSTAT) 0.4 MG SL tablet Place 1 tablet (0.4 mg total) under the tongue every 5 (five) minutes x 3 doses as needed for chest pain. 25 tablet 12   No current facility-administered medications for this visit.     Allergies as of 07/31/2019 - Review Complete 07/31/2019  Allergen Reaction Noted  . Bee venom Nausea And Vomiting 03/27/2014    Family History  Problem Relation Age of Onset  . Diabetes Mother   . Heart failure Mother   . Colon polyps Mother        REQUIRED COLECTOMY  . Heart failure Father   . Heart attack Maternal Grandmother   . Pneumonia Maternal Grandfather   . Diabetes Paternal Grandmother   . Obesity Paternal Grandmother   . Heart attack Paternal Grandfather   . Colon cancer Neg Hx     Social History   Socioeconomic History  . Marital status: Married    Spouse name: Not on file  . Number of children: Not on file  . Years of education: Not on file  . Highest education level: Not on file  Occupational History  . Not on file  Social Needs  . Financial resource strain: Not on file  . Food insecurity    Worry: Not on file    Inability: Not on file  . Transportation needs    Medical: Not on file    Non-medical: Not on file  Tobacco Use  . Smoking status: Current Some Day Smoker    Packs/day: 0.50  . Smokeless tobacco: Never Used  . Tobacco comment: Only smokes when at work; doesn't smoke at home around children  Substance and Sexual Activity  . Alcohol use: Not Currently    Comment: rare   . Drug use: No  . Sexual activity: Not on file  Lifestyle  . Physical activity    Days per week: Not on file    Minutes per session: Not on file  . Stress: Not on file  Relationships  . Social Herbalist on phone: Not on file    Gets together: Not on file    Attends religious service: Not on file    Active member of club or organization: Not on file    Attends meetings of clubs or organizations: Not on file    Relationship status: Not on file  Other Topics Concern  . 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 MARRIED: 2 YRS AND 15 YRS TOGETHER. NO CIGS OR ETOH.  SON AGE 65 AND HAD A DAUGHTER 3 WEEKS AGO(JAN 2019).     Review of Systems: General: Negative for anorexia, weight loss, fever, chills,  fatigue, weakness. ENT: Negative for hoarseness, difficulty swallowing. CV: Negative for chest pain, angina, palpitations, peripheral edema.  Respiratory: Negative for dyspnea at rest, cough, sputum, wheezing.  GI: See history of present illness. Endo: Negative for unusual weight change.  Heme: Negative for bruising or bleeding. Allergy: Negative for rash or hives.   Physical Exam: BP (!) 144/96   Pulse 90   Temp (!) 97 F (36.1 C) (Oral)   Ht 6' (1.829 m)   Wt 268 lb 3.2 oz (121.7 kg)   BMI 36.37 kg/m  General:   Alert and oriented. Pleasant and cooperative. Well-nourished and well-developed. Does not appear in distress. Eyes:  Without icterus, sclera clear and conjunctiva pink.  Ears:  Normal auditory acuity. Cardiovascular:  S1, S2 present without murmurs appreciated. Extremities without clubbing or edema. Respiratory:  Clear to auscultation bilaterally. No wheezes, rales, or rhonchi. No distress.  Gastrointestinal:  +BS, soft, and non-distended. Moderate to significant TTP on palpation of lower abdomen. No HSM noted. No guarding or rebound. No masses appreciated.  Rectal:  Deferred  Musculoskalatal:  Symmetrical without gross  deformities. Skin:  Intact without significant lesions or rashes. Neurologic:  Alert and oriented x4;  grossly normal neurologically. Psych:  Alert and cooperative. Normal mood and affect. Heme/Lymph/Immune: No excessive bruising noted.    07/31/2019 8:33 AM   Disclaimer: This note was dictated with voice recognition software. Similar sounding words can inadvertently be transcribed and may not be corrected upon review.

## 2019-07-31 NOTE — Assessment & Plan Note (Signed)
History of acute diverticulitis documented on CT imaging November 2018 and November 2019.  He called our office yesterday indicating significant lower abdominal pain that was described as sharp.  Feels like the last time he had diverticulitis.  He was started on empiric antibiotics and made an office visit for today.  Continues with abdominal pain, has only been on antibiotics since yesterday.  At this point I will check a stat CT of the abdomen and pelvis.  I will also check stat labs including CBC, CMP, lipase.  If he does indeed have another episode of diverticulitis we may need to discuss possible surgical referral for partial colectomy.  His wife recently had partial colectomy for perforated diverticulitis with surgical complications and so he is a bit hesitant at this time.  Follow-up in 4 weeks, call us for any worsening or severe symptoms.

## 2019-08-06 ENCOUNTER — Telehealth: Payer: Self-pay | Admitting: Gastroenterology

## 2019-08-06 NOTE — Telephone Encounter (Signed)
PT said he will be completing Cipro and Flagyl tomorrow. He was feeling better and the abd pain he had is much improved. But he has had diarrhea for the last 2 days and today 5 times already. He is having abdominal cramps with the diarrhea. Randall Hiss, please advise!

## 2019-08-06 NOTE — Telephone Encounter (Signed)
Discuss with the patient tomorrow. If he's still having watery diarrhea I'd like to check stool studies. In the meantime, make sure he's drinking plenty of water to stay adequately hydrated.

## 2019-08-06 NOTE — Telephone Encounter (Signed)
Pt has questions for the nurse about his medication. Please call him at [phone]

## 2019-08-07 ENCOUNTER — Inpatient Hospital Stay (HOSPITAL_COMMUNITY)
Admission: EM | Admit: 2019-08-07 | Discharge: 2019-08-09 | DRG: 392 | Disposition: A | Discharge status: Home / Self Care | Payer: 59 | Attending: Family Medicine | Admitting: Family Medicine

## 2019-08-07 ENCOUNTER — Emergency Department (HOSPITAL_COMMUNITY): Payer: 59

## 2019-08-07 ENCOUNTER — Other Ambulatory Visit: Payer: Self-pay

## 2019-08-07 ENCOUNTER — Encounter (HOSPITAL_COMMUNITY): Payer: Self-pay | Admitting: Emergency Medicine

## 2019-08-07 DIAGNOSIS — K760 Fatty (change of) liver, not elsewhere classified: Secondary | ICD-10-CM | POA: Diagnosis present

## 2019-08-07 DIAGNOSIS — K529 Noninfective gastroenteritis and colitis, unspecified: Secondary | ICD-10-CM | POA: Diagnosis present

## 2019-08-07 DIAGNOSIS — K5732 Diverticulitis of large intestine without perforation or abscess without bleeding: Secondary | ICD-10-CM | POA: Diagnosis present

## 2019-08-07 DIAGNOSIS — E669 Obesity, unspecified: Secondary | ICD-10-CM | POA: Diagnosis present

## 2019-08-07 DIAGNOSIS — I2510 Atherosclerotic heart disease of native coronary artery without angina pectoris: Secondary | ICD-10-CM | POA: Diagnosis present

## 2019-08-07 DIAGNOSIS — F17210 Nicotine dependence, cigarettes, uncomplicated: Secondary | ICD-10-CM | POA: Diagnosis present

## 2019-08-07 DIAGNOSIS — Z91030 Bee allergy status: Secondary | ICD-10-CM

## 2019-08-07 DIAGNOSIS — Z8249 Family history of ischemic heart disease and other diseases of the circulatory system: Secondary | ICD-10-CM | POA: Diagnosis not present

## 2019-08-07 DIAGNOSIS — Z20828 Contact with and (suspected) exposure to other viral communicable diseases: Secondary | ICD-10-CM | POA: Diagnosis present

## 2019-08-07 DIAGNOSIS — I10 Essential (primary) hypertension: Secondary | ICD-10-CM | POA: Diagnosis present

## 2019-08-07 DIAGNOSIS — R197 Diarrhea, unspecified: Secondary | ICD-10-CM | POA: Diagnosis present

## 2019-08-07 DIAGNOSIS — Z6835 Body mass index (BMI) 35.0-35.9, adult: Secondary | ICD-10-CM

## 2019-08-07 DIAGNOSIS — K5792 Diverticulitis of intestine, part unspecified, without perforation or abscess without bleeding: Secondary | ICD-10-CM | POA: Diagnosis present

## 2019-08-07 LAB — COMPREHENSIVE METABOLIC PANEL
ALT: 20 U/L (ref 0–44)
AST: 13 U/L — ABNORMAL LOW (ref 15–41)
Albumin: 4 g/dL (ref 3.5–5.0)
Alkaline Phosphatase: 79 U/L (ref 38–126)
Anion gap: 8 (ref 5–15)
BUN: 9 mg/dL (ref 6–20)
CO2: 27 mmol/L (ref 22–32)
Calcium: 8.7 mg/dL — ABNORMAL LOW (ref 8.9–10.3)
Chloride: 100 mmol/L (ref 98–111)
Creatinine, Ser: 0.86 mg/dL (ref 0.61–1.24)
GFR calc Af Amer: >60 mL/min (ref >60)
GFR calc non Af Amer: >60 mL/min (ref >60)
Glucose, Bld: 103 mg/dL — ABNORMAL HIGH (ref 70–99)
Potassium: 3.4 mmol/L — ABNORMAL LOW (ref 3.5–5.1)
Sodium: 135 mmol/L (ref 135–145)
Total Bilirubin: 0.5 mg/dL (ref 0.3–1.2)
Total Protein: 7 g/dL (ref 6.5–8.1)

## 2019-08-07 LAB — CBC WITH DIFFERENTIAL/PLATELET
Abs Immature Granulocytes: 0.03 10*3/uL (ref 0.00–0.07)
Basophils Absolute: 0.1 10*3/uL (ref 0.0–0.1)
Basophils Relative: 0 %
Eosinophils Absolute: 0.1 10*3/uL (ref 0.0–0.5)
Eosinophils Relative: 1 %
HCT: 55.5 % — ABNORMAL HIGH (ref 39.0–52.0)
Hemoglobin: 18.2 g/dL — ABNORMAL HIGH (ref 13.0–17.0)
Immature Granulocytes: 0 %
Lymphocytes Relative: 22 %
Lymphs Abs: 2.5 10*3/uL (ref 0.7–4.0)
MCH: 30.6 pg (ref 26.0–34.0)
MCHC: 32.8 g/dL (ref 30.0–36.0)
MCV: 93.4 fL (ref 80.0–100.0)
Monocytes Absolute: 1 10*3/uL (ref 0.1–1.0)
Monocytes Relative: 9 %
Neutro Abs: 7.9 10*3/uL — ABNORMAL HIGH (ref 1.7–7.7)
Neutrophils Relative %: 68 %
Platelets: 319 10*3/uL (ref 150–400)
RBC: 5.94 MIL/uL — ABNORMAL HIGH (ref 4.22–5.81)
RDW: 12.8 % (ref 11.5–15.5)
WBC: 11.6 10*3/uL — ABNORMAL HIGH (ref 4.0–10.5)
nRBC: 0 % (ref 0.0–0.2)

## 2019-08-07 LAB — LACTIC ACID, PLASMA
Lactic Acid, Venous: 0.8 mmol/L (ref 0.5–1.9)
Lactic Acid, Venous: 0.6 mmol/L (ref 0.5–1.9)

## 2019-08-07 LAB — HIV ANTIBODY (ROUTINE TESTING W REFLEX): HIV Screen 4th Generation wRfx: NONREACTIVE

## 2019-08-07 LAB — LIPASE, BLOOD: Lipase: 24 U/L (ref 11–51)

## 2019-08-07 MED ORDER — ALBUTEROL SULFATE (2.5 MG/3ML) 0.083% IN NEBU: 2.5000 mg | INHALATION_SOLUTION | RESPIRATORY_TRACT | Status: DC | PRN

## 2019-08-07 MED ORDER — HEPARIN SODIUM (PORCINE) 5000 UNIT/ML IJ SOLN
5000.0000 [IU] | Freq: Three times a day (TID) | INTRAMUSCULAR | Status: DC
  Administered 2019-08-07 – 2019-08-09 (×5): 5000 [IU] via SUBCUTANEOUS
  Filled 2019-08-07 (×5): qty 1

## 2019-08-07 MED ORDER — PIPERACILLIN-TAZOBACTAM 3.375 G IVPB
3.3750 g | Freq: Three times a day (TID) | INTRAVENOUS | Status: DC
  Administered 2019-08-07 – 2019-08-09 (×6): 3.375 g via INTRAVENOUS
  Filled 2019-08-07 (×6): qty 50

## 2019-08-07 MED ORDER — MORPHINE SULFATE (PF) 2 MG/ML IV SOLN
2.0000 mg | INTRAVENOUS | Status: DC | PRN
  Administered 2019-08-07 – 2019-08-08 (×8): 2 mg via INTRAVENOUS
  Filled 2019-08-07 (×8): qty 1

## 2019-08-07 MED ORDER — ACETAMINOPHEN 325 MG PO TABS: 650.0000 mg | ORAL_TABLET | Freq: Four times a day (QID) | ORAL | Status: DC | PRN

## 2019-08-07 MED ORDER — SODIUM CHLORIDE 0.9% FLUSH: 3.0000 mL | INTRAVENOUS | Status: DC | PRN

## 2019-08-07 MED ORDER — SODIUM CHLORIDE 0.9 % IV BOLUS
500.0000 mL | Freq: Once | INTRAVENOUS | Status: AC
  Administered 2019-08-07: 500 mL via INTRAVENOUS

## 2019-08-07 MED ORDER — ONDANSETRON HCL 4 MG/2ML IJ SOLN: 4.0000 mg | Freq: Four times a day (QID) | INTRAMUSCULAR | Status: DC | PRN

## 2019-08-07 MED ORDER — SODIUM CHLORIDE 0.9% FLUSH
3.0000 mL | Freq: Two times a day (BID) | INTRAVENOUS | Status: DC
  Administered 2019-08-09: 3 mL via INTRAVENOUS

## 2019-08-07 MED ORDER — ONDANSETRON HCL 4 MG/2ML IJ SOLN
4.0000 mg | Freq: Once | INTRAMUSCULAR | Status: AC
  Administered 2019-08-07: 08:00:00 4 mg via INTRAVENOUS
  Filled 2019-08-07: qty 2

## 2019-08-07 MED ORDER — PIPERACILLIN-TAZOBACTAM 3.375 G IVPB 30 MIN
3.3750 g | Freq: Once | INTRAVENOUS | Status: AC
  Administered 2019-08-07: 11:00:00 3.375 g via INTRAVENOUS

## 2019-08-07 MED ORDER — PIPERACILLIN-TAZOBACTAM 3.375 G IVPB 30 MIN
3.3750 g | Freq: Four times a day (QID) | INTRAVENOUS | Status: DC
  Filled 2019-08-07: qty 50

## 2019-08-07 MED ORDER — POLYETHYLENE GLYCOL 3350 17 G PO PACK: 17.0000 g | PACK | Freq: Every day | ORAL | Status: DC | PRN

## 2019-08-07 MED ORDER — OXYCODONE HCL 5 MG PO TABS
5.0000 mg | ORAL_TABLET | ORAL | Status: DC | PRN
  Administered 2019-08-07: 22:00:00 5 mg via ORAL
  Filled 2019-08-07: qty 1

## 2019-08-07 MED ORDER — POTASSIUM CHLORIDE IN NACL 20-0.9 MEQ/L-% IV SOLN
INTRAVENOUS | Status: DC
  Administered 2019-08-07 – 2019-08-08 (×2): via INTRAVENOUS

## 2019-08-07 MED ORDER — ACETAMINOPHEN 325 MG PO TABS: 325.0000 mg | ORAL_TABLET | Freq: Four times a day (QID) | ORAL | Status: DC | PRN

## 2019-08-07 MED ORDER — MORPHINE SULFATE (PF) 4 MG/ML IV SOLN
4.0000 mg | Freq: Once | INTRAVENOUS | Status: AC
  Administered 2019-08-07: 4 mg via INTRAVENOUS
  Filled 2019-08-07: qty 1

## 2019-08-07 MED ORDER — LACTATED RINGERS IV SOLN
INTRAVENOUS | Status: DC
  Administered 2019-08-07 (×2): via INTRAVENOUS

## 2019-08-07 MED ORDER — NITROGLYCERIN 0.4 MG SL SUBL: 0.4000 mg | SUBLINGUAL_TABLET | SUBLINGUAL | Status: DC | PRN

## 2019-08-07 MED ORDER — TRAZODONE HCL 50 MG PO TABS
50.0000 mg | ORAL_TABLET | Freq: Every evening | ORAL | Status: DC | PRN
  Filled 2019-08-07: qty 1

## 2019-08-07 MED ORDER — IOHEXOL 300 MG/ML  SOLN
100.0000 mL | Freq: Once | INTRAMUSCULAR | Status: AC | PRN
  Administered 2019-08-07: 100 mL via INTRAVENOUS

## 2019-08-07 MED ORDER — ACETAMINOPHEN 650 MG RE SUPP: 650.0000 mg | Freq: Four times a day (QID) | RECTAL | Status: DC | PRN

## 2019-08-07 MED ORDER — ONDANSETRON HCL 4 MG PO TABS: 4.0000 mg | ORAL_TABLET | Freq: Four times a day (QID) | ORAL | Status: DC | PRN

## 2019-08-07 MED ORDER — SODIUM CHLORIDE 0.9 % IV SOLN: 250.0000 mL | INTRAVENOUS | Status: DC | PRN

## 2019-08-07 NOTE — Telephone Encounter (Signed)
I called pt and he is at the ED now. Said he is having watery, dark diarrhea. He had been in excruciating pain so he had to go to the ED. Forwarding message to Walden Field, NP ( who is out today)

## 2019-08-07 NOTE — Progress Notes (Signed)
Pharmacy Antibiotic Note  SULO BURLINGAME is a 38 y.o. male admitted on 08/07/2019 with diverticulitis.  Pharmacy has been consulted for Zosyn dosing.  Plan: Zosyn 3.375g IV q8h (4 hour infusion).  Monitor labs, c/s, and patient improvement.  Weight: 262 lb (118.8 kg)  Temp (24hrs), Avg:98.2 F (36.8 C), Min:98.2 F (36.8 C), Max:98.2 F (36.8 C)  Recent Labs  Lab 08/07/19 0731 08/07/19 0857 08/07/19 1040  WBC 11.6*  --   --   CREATININE 0.86  --   --   LATICACIDVEN  --  0.8 0.6    Estimated Creatinine Clearance: 158 mL/min (by C-G formula based on SCr of 0.86 mg/dL).    Allergies  Allergen Reactions  . Bee Venom Nausea And Vomiting    Antimicrobials this admission: Zosyn 11/3 >>     Thank you for allowing pharmacy to be a part of this patient's care.  Ramond Craver 08/07/2019 11:50 AM

## 2019-08-07 NOTE — H&P (Signed)
Patient Demographics:    Juan Lopez, is a 38 y.o. male  MRN: [id number]   DOB - [birth year]  Admit Date - 08/07/2019  Outpatient Primary MD for the patient is Redmond School, MD   Assessment & Plan:    Principal Problem:   Acute Diverticulitis Active Problems:   Essential hypertension   Fatty liver   Diarrhea   Obesity (BMI 30-39.9)    1) recurrent persistent diverticulitis--- failed Cipro and Flagyl PTA for 8 days,  -Repeat CT abdomen and pelvis with persistent diverticulitis and worsening inflammatory changes but no definite abscess or perforation  -WBC 11.6 patient with chills -treat empirically with IV Zosyn, if improved may discharge on p.o. Augmentin with outpatient follow-up with GI -IV and oral pain medications as indicated -Antinausea medicine as needed  2) chronic diarrhea-----patient follows with GI as outpatient, may use Imodium as needed, maintain adequate hydration and replace electrolytes  3) history of hypertension--- Per patient he has been off his antihypertensives after losing weight,  may use IV labetalol when necessary  Every 4 hours for systolic blood pressure over 160 mmhg  4) history of fatty liver and obesity--patient has lost some weight apparently BMI is down to 35, -Follow-up as outpatient with GI service for ongoing monitoring of his fatty liver -Encouraged to continue to lose weight  5) history of myopericarditis--- diagnosed January 2018, stable--last known EF based on echo from January 2019 55 to 60%  With History of - Reviewed by me  Past Medical History:  Diagnosis Date   Acute myopericarditis    a. dx 10/2016   CAD (coronary artery disease)    a. LHC 10/2016 with mild luminal irregularities. Placed on statin    Diverticulitis    Family history of early CAD     HTN (hypertension)       Past Surgical History:  Procedure Laterality Date   CARDIAC CATHETERIZATION [name] 10/16/2016   Procedure: Left Heart Cath and Coronary Angiography;  Surgeon: Peter M Martinique, MD;  Location: [facility];  Service: Cardiovascular;  Laterality: [name];   COLONOSCOPY [name] 11/14/2017   Dr. Oneida Alar: 3 mm polyp from proximal ascending colon removed, rectosigmoid colon and sigmoid colon diverticula, external and internal hemorrhoids, random colon biopsies were benign.   POLYPECTOMY  11/14/2017   Procedure: POLYPECTOMY;  Surgeon: Danie Binder, MD;  Location: [facility];  Service: Endoscopy;;  Ascending colon (CS)   TONSILLECTOMY     WISDOM TOOTH EXTRACTION     AGE 63-25      Chief Complaint  Patient presents with   Abdominal Pain      HPI:    Juan Lopez  is a 38 y.o. male with past medical history relevant for obesity, fatty liver, recurrent diverticulitis, history of HTN and myopericarditis presents with worsening abdominal pain -Patient was diagnosed with another episode of diverticulitis, started on Cipro and Flagyl about 8 days ago, initially symptoms subsided but over  the last couple days abdominal pain and chills have worsened -Repeat CT abdomen and pelvis today with worsening inflammatory changes consistent with persistent diverticulitis without abscess or perforation  CT abd /Pelvis -IMPRESSION: Persistent diverticulitis. The degree of pericolonic inflammatory change has increased somewhat in the interval from the prior exam although no drainable collection or perforation is identified at this time.  Labs with a creatinine of 0.86, lipase is 24 -WBC 11.6 hemoglobin 18.2 -Lactic acid is not elevated    Review of systems:    In addition to the HPI above,   A full Review of  Systems was done, all other systems reviewed are negative except as noted above in HPI , .    Social History:  Reviewed by me    Social History   Tobacco  Use   Smoking status: Current Some Day Smoker    Packs/day: 0.50   Smokeless tobacco: Never Used   Tobacco comment: Only smokes when at work; doesn't smoke at home around children  Substance Use Topics   Alcohol use: Not Currently    Comment: rare       Family History :  Reviewed by me    Family History  Problem Relation Age of Onset   Diabetes Mother    Heart failure Mother    Colon polyps Mother        REQUIRED COLECTOMY   Heart failure Father    Heart attack Maternal Grandmother    Pneumonia Maternal Grandfather    Diabetes Paternal Grandmother    Obesity Paternal Grandmother    Heart attack Paternal Grandfather    Colon cancer Neg Hx      Home Medications:   Prior to Admission medications   Medication Sig Start Date End Date Taking? Authorizing Provider  acetaminophen (TYLENOL) 325 MG tablet Take 325-650 mg by mouth every 6 (six) hours as needed for moderate pain. For pain   Yes [provider]  ciprofloxacin (CIPRO) 500 MG tablet Take 1 tablet (500 mg total) by mouth 2 (two) times daily for 10 days. 07/30/19 08/09/19 Yes Mahala Menghini, PA-C  metroNIDAZOLE (FLAGYL) 500 MG tablet Take 1 tablet (500 mg total) by mouth 3 (three) times daily for 10 days. 07/30/19 08/09/19 Yes Mahala Menghini, PA-C  nitroGLYCERIN (NITROSTAT) 0.4 MG SL tablet Place 1 tablet (0.4 mg total) under the tongue every 5 (five) minutes x 3 doses as needed for chest pain. 10/17/16  Yes Eileen Stanford, PA-C     Allergies:     Allergies  Allergen Reactions   Bee Venom Nausea And Vomiting     Physical Exam:   Vitals  Blood pressure 121/90, pulse 73, temperature 98.2 F (36.8 C), temperature source Oral, resp. rate 19, weight 118.8 kg, SpO2 99 %.  Physical Examination: General appearance - alert, obese appearing, and in no distress  Mental status - alert, oriented to person, place, and time,  Eyes - sclera anicteric Neck - supple, no JVD elevation , Chest -  clear  to auscultation bilaterally, symmetrical air movement,  Heart - S1 and S2 normal, regular  Abdomen - soft,   nondistended, lower abdominal tenderness without rebound or guarding  neurological - screening mental status exam normal, neck supple without rigidity, cranial nerves II through XII intact, DTR's normal and symmetric Extremities - no pedal edema noted, intact peripheral pulses  Skin - warm, dry     Data Review:    CBC Recent Labs  Lab 08/07/19 0731  WBC 11.6*  HGB 18.2*  HCT 55.5*  PLT 319  MCV 93.4  MCH 30.6  MCHC 32.8  RDW 12.8  LYMPHSABS 2.5  MONOABS 1.0  EOSABS 0.1  BASOSABS 0.1   ------------------------------------------------------------------------------------------------------------------  Chemistries  Recent Labs  Lab 08/07/19 0731  NA 135  K 3.4*  CL 100  CO2 27  GLUCOSE 103*  BUN 9  CREATININE 0.86  CALCIUM 8.7*  AST 13*  ALT 20  ALKPHOS 79  BILITOT 0.5   ------------------------------------------------------------------------------------------------------------------ estimated creatinine clearance is 158 mL/min (by C-G formula based on SCr of 0.86 mg/dL). ------------------------------------------------------------------------------------------------------------------ No results for input(s): TSH, T4TOTAL, T3FREE, THYROIDAB in the last 72 hours.  Invalid input(s): FREET3   Coagulation profile No results for input(s): INR, PROTIME in the last 168 hours. ------------------------------------------------------------------------------------------------------------------- No results for input(s): DDIMER in the last 72 hours. -------------------------------------------------------------------------------------------------------------------  Cardiac Enzymes No results for input(s): CKMB, TROPONINI, MYOGLOBIN in the last 168 hours.  Invalid input(s):  CK ------------------------------------------------------------------------------------------------------------------ No results found for: BNP   ---------------------------------------------------------------------------------------------------------------  Urinalysis    Component Value Date/Time   COLORURINE YELLOW 08/12/2018 1018   APPEARANCEUR CLEAR 08/12/2018 1018   LABSPEC 1.017 08/12/2018 1018   PHURINE 7.0 08/12/2018 1018   GLUCOSEU NEGATIVE 08/12/2018 1018   HGBUR NEGATIVE 08/12/2018 1018   BILIRUBINUR NEGATIVE 08/12/2018 1018   KETONESUR NEGATIVE 08/12/2018 1018   PROTEINUR NEGATIVE 08/12/2018 1018   UROBILINOGEN 0.2 03/27/2014 1624   NITRITE NEGATIVE 08/12/2018 1018   LEUKOCYTESUR NEGATIVE 08/12/2018 1018    ----------------------------------------------------------------------------------------------------------------   Imaging Results:    Ct Abdomen Pelvis W Contrast  Result Date: 08/07/2019 CLINICAL DATA:  Abdominal pain, history of recent diverticulitis. EXAM: CT ABDOMEN AND PELVIS WITH CONTRAST TECHNIQUE: Multidetector CT imaging of the abdomen and pelvis was performed using the standard protocol following bolus administration of intravenous contrast. CONTRAST:  16mL OMNIPAQUE IOHEXOL 300 MG/ML  SOLN COMPARISON:  07/31/2019 FINDINGS: Lower chest: No acute abnormality. Hepatobiliary: Fatty infiltration of the liver is noted. The gallbladder is within normal limits. Pancreas: Unremarkable. No pancreatic ductal dilatation or surrounding inflammatory changes. Spleen: Normal in size without focal abnormality. Adrenals/Urinary Tract: Adrenal glands are within normal limits. Kidneys demonstrate normal excretion bilaterally. No renal mass lesion is seen. No obstructive changes are noted. The bladder is partially distended. Stomach/Bowel: There again changes consistent with sigmoid diverticulitis. Slight increase in pericolonic inflammatory change is noted. No evidence of  perforation or focal abscess formation is noted at this time. Persistent wall thickening is seen. The more proximal colon demonstrates diverticular change without focal abnormality. The appendix is well visualized with inspissated barium. Small bowel is within normal limits. The stomach is decompressed. Vascular/Lymphatic: No significant vascular findings are present. No enlarged abdominal or pelvic lymph nodes. Reproductive: Prostate is unremarkable. Other: No abdominal wall hernia or abnormality. No abdominopelvic ascites. Musculoskeletal: No acute or significant osseous findings. IMPRESSION: Persistent diverticulitis. The degree of pericolonic inflammatory change has increased somewhat in the interval from the prior exam although no drainable collection or perforation is identified at this time. No other focal abnormality is noted. Electronically Signed   By: Inez Catalina M.D.   On: 08/07/2019 09:45    Radiological Exams on Admission: Ct Abdomen Pelvis W Contrast  Result Date: 08/07/2019 CLINICAL DATA:  Abdominal pain, history of recent diverticulitis. EXAM: CT ABDOMEN AND PELVIS WITH CONTRAST TECHNIQUE: Multidetector CT imaging of the abdomen and pelvis was performed using the standard protocol following bolus administration of intravenous contrast. CONTRAST:  169mL OMNIPAQUE IOHEXOL 300 MG/ML  SOLN COMPARISON:  07/31/2019  FINDINGS: Lower chest: No acute abnormality. Hepatobiliary: Fatty infiltration of the liver is noted. The gallbladder is within normal limits. Pancreas: Unremarkable. No pancreatic ductal dilatation or surrounding inflammatory changes. Spleen: Normal in size without focal abnormality. Adrenals/Urinary Tract: Adrenal glands are within normal limits. Kidneys demonstrate normal excretion bilaterally. No renal mass lesion is seen. No obstructive changes are noted. The bladder is partially distended. Stomach/Bowel: There again changes consistent with sigmoid diverticulitis. Slight increase in  pericolonic inflammatory change is noted. No evidence of perforation or focal abscess formation is noted at this time. Persistent wall thickening is seen. The more proximal colon demonstrates diverticular change without focal abnormality. The appendix is well visualized with inspissated barium. Small bowel is within normal limits. The stomach is decompressed. Vascular/Lymphatic: No significant vascular findings are present. No enlarged abdominal or pelvic lymph nodes. Reproductive: Prostate is unremarkable. Other: No abdominal wall hernia or abnormality. No abdominopelvic ascites. Musculoskeletal: No acute or significant osseous findings. IMPRESSION: Persistent diverticulitis. The degree of pericolonic inflammatory change has increased somewhat in the interval from the prior exam although no drainable collection or perforation is identified at this time. No other focal abnormality is noted. Electronically Signed   By: Inez Catalina M.D.   On: 08/07/2019 09:45    DVT Prophylaxis -SCD /heparin AM Labs Ordered, also please review Full Orders  Family Communication: Admission, patients condition and plan of care including tests being ordered have been discussed with the patient who indicate understanding and agree with the plan   Code Status - Full Code  Likely DC to  home  Condition   stable  Roxan Hockey M.D on 08/07/2019 at 4:13 PM Go to [email or web address] -  for contact info  [facility] - Office  [phone]

## 2019-08-07 NOTE — Plan of Care (Signed)
Patient verbalized decrease abdominal pain with pain medication.

## 2019-08-07 NOTE — ED Triage Notes (Signed)
Pt states GI doctor started pt on cipro and flagyl last Monday due to his diverticulitis "flaring up." Pt states last night he began to have abdominal pain, nausea, and diarrhea.

## 2019-08-07 NOTE — ED Provider Notes (Signed)
Emergency Department Provider Note   I have reviewed the triage vital signs and the nursing notes.   HISTORY  Chief Complaint Abdominal Pain   HPI Juan Lopez is a 38 y.o. male with PMH of HTN and recurrent diverticulitis presents to the emergency department for evaluation of worsening abdominal pain, diarrhea typical of his diverticulitis.  Patient was seen by his gastroenterologist approximately 1 week ago.  I performed lab work along with CT scan which showed acute diverticulitis.  He was started on Cipro and Flagyl and states he has been compliant with his medications over the past 8 days.  He began to feel much better but 2 days ago developed return of watery, nonbloody diarrhea along with lower abdominal cramping.  He states that in the past 24 hours his pain is worsened significantly.  He denies fevers.  Pain is in the center, lower portion of his abdomen.  Symptoms are worse with touching the area or wearing tight pants.  No other modifying factors.   Past Medical History:  Diagnosis Date  . Acute myopericarditis    a. dx 10/2016  . CAD (coronary artery disease)    a. LHC 10/2016 with mild luminal irregularities. Placed on statin   . Diverticulitis   . Family history of early CAD   . HTN (hypertension)     Patient Active Problem List   Diagnosis Date Noted  . Diarrhea 08/07/2019  . Recurrent acute sigmoid diverticulitis 08/07/2019  . Obesity (BMI 30-39.9) 08/07/2019  . Acute diverticulitis 08/07/2019  . Abdominal pain 07/31/2019  . Diverticulitis large intestine w/o perforation or abscess w/o bleeding 08/16/2018  . RUQ pain 08/16/2018  . Fatty liver 08/16/2018  . Change in bowel habits 11/02/2017  . Acute myopericarditis 10/16/2016  . Essential hypertension 10/16/2016  . Family history of early CAD 10/16/2016    Past Surgical History:  Procedure Laterality Date  . CARDIAC CATHETERIZATION [name] 10/16/2016   Procedure: Left Heart Cath and Coronary Angiography;   Surgeon: Peter M Martinique, MD;  Location: [facility];  Service: Cardiovascular;  Laterality: [name];  . COLONOSCOPY [name] 11/14/2017   Dr. Oneida Alar: 3 mm polyp from proximal ascending colon removed, rectosigmoid colon and sigmoid colon diverticula, external and internal hemorrhoids, random colon biopsies were benign.  Marland Kitchen POLYPECTOMY  11/14/2017   Procedure: POLYPECTOMY;  Surgeon: Danie Binder, MD;  Location: [facility];  Service: Endoscopy;;  Ascending colon (CS)  . TONSILLECTOMY    . WISDOM TOOTH EXTRACTION     AGE [age]    Allergies Bee venom  Family History  Problem Relation Age of Onset  . Diabetes Mother   . Heart failure Mother   . Colon polyps Mother        REQUIRED COLECTOMY  . Heart failure Father   . Heart attack Maternal Grandmother   . Pneumonia Maternal Grandfather   . Diabetes Paternal Grandmother   . Obesity Paternal Grandmother   . Heart attack Paternal Grandfather   . Colon cancer Neg Hx     Social History Social History   Tobacco Use  . Smoking status: Current Some Day Smoker    Packs/day: 0.50  . Smokeless tobacco: Never Used  . Tobacco comment: Only smokes when at work; doesn't smoke at home around children  Substance Use Topics  . Alcohol use: Not Currently    Comment: rare  . Drug use: No    Review of Systems  Constitutional: No fever/chills Eyes: No visual changes. ENT: No  sore throat. Cardiovascular: Denies chest pain. Respiratory: Denies shortness of breath. Gastrointestinal: Positive lower abdominal pain. Positive nausea, no vomiting. Positive diarrhea.  No constipation. Genitourinary: Negative for dysuria. Musculoskeletal: Negative for back pain. Skin: Negative for rash. Neurological: Negative for headaches, focal weakness or numbness.  10-point ROS otherwise negative.  ____________________________________________   PHYSICAL EXAM:  VITAL SIGNS: ED Triage Vitals  Enc Vitals Group     BP 08/07/19 0706 (!) 148/101     Pulse  Rate 08/07/19 0706 (!) 104     Resp 08/07/19 0706 19     Temp 08/07/19 0706 98.2 F (36.8 C)     Temp Source 08/07/19 0706 Oral     SpO2 08/07/19 0706 98 %     Weight 08/07/19 0707 262 lb (118.8 kg)   Constitutional: Alert and oriented. Well appearing and in no acute distress. Eyes: Conjunctivae are normal.  Head: Atraumatic. Nose: No congestion/rhinnorhea. Mouth/Throat: Mucous membranes are moist. Neck: No stridor.   Cardiovascular: Normal rate, regular rhythm. Good peripheral circulation. Grossly normal heart sounds.   Respiratory: Normal respiratory effort.  No retractions. Lungs CTAB. Gastrointestinal: Soft with moderate suprapubic tenderness. No rebound or guarding. No distention.  Musculoskeletal: No gross deformities of extremities. Neurologic:  Normal speech and language. Skin:  Skin is warm, dry and intact. No rash noted.  ____________________________________________   LABS (all labs ordered are listed, but only abnormal results are displayed)  Labs Reviewed  COMPREHENSIVE METABOLIC PANEL - Abnormal; Notable for the following components:      Result Value   Potassium 3.4 (*)    Glucose, Bld 103 (*)    Calcium 8.7 (*)    AST 13 (*)    All other components within normal limits  CBC WITH DIFFERENTIAL/PLATELET - Abnormal; Notable for the following components:   WBC 11.6 (*)    RBC 5.94 (*)    Hemoglobin 18.2 (*)    HCT 55.5 (*)    Neutro Abs 7.9 (*)    All other components within normal limits  SARS CORONAVIRUS 2 (TAT 6-24 HRS)  LIPASE, BLOOD  LACTIC ACID, PLASMA  LACTIC ACID, PLASMA  HIV ANTIBODY (ROUTINE TESTING W REFLEX)  CBC   ____________________________________________  RADIOLOGY  Ct Abdomen Pelvis W Contrast  Result Date: 08/07/2019 CLINICAL DATA:  Abdominal pain, history of recent diverticulitis. EXAM: CT ABDOMEN AND PELVIS WITH CONTRAST TECHNIQUE: Multidetector CT imaging of the abdomen and pelvis was performed using the standard protocol following  bolus administration of intravenous contrast. CONTRAST:  135mL OMNIPAQUE IOHEXOL 300 MG/ML  SOLN COMPARISON:  07/31/2019 FINDINGS: Lower chest: No acute abnormality. Hepatobiliary: Fatty infiltration of the liver is noted. The gallbladder is within normal limits. Pancreas: Unremarkable. No pancreatic ductal dilatation or surrounding inflammatory changes. Spleen: Normal in size without focal abnormality. Adrenals/Urinary Tract: Adrenal glands are within normal limits. Kidneys demonstrate normal excretion bilaterally. No renal mass lesion is seen. No obstructive changes are noted. The bladder is partially distended. Stomach/Bowel: There again changes consistent with sigmoid diverticulitis. Slight increase in pericolonic inflammatory change is noted. No evidence of perforation or focal abscess formation is noted at this time. Persistent wall thickening is seen. The more proximal colon demonstrates diverticular change without focal abnormality. The appendix is well visualized with inspissated barium. Small bowel is within normal limits. The stomach is decompressed. Vascular/Lymphatic: No significant vascular findings are present. No enlarged abdominal or pelvic lymph nodes. Reproductive: Prostate is unremarkable. Other: No abdominal wall hernia or abnormality. No abdominopelvic ascites. Musculoskeletal: No acute or significant  osseous findings. IMPRESSION: Persistent diverticulitis. The degree of pericolonic inflammatory change has increased somewhat in the interval from the prior exam although no drainable collection or perforation is identified at this time. No other focal abnormality is noted. Electronically Signed   By: Inez Catalina M.D.   On: 08/07/2019 09:45    ____________________________________________   PROCEDURES  Procedure(s) performed:   Procedures  None ____________________________________________   INITIAL IMPRESSION / ASSESSMENT AND PLAN / ED COURSE  Pertinent labs & imaging results  that were available during my care of the patient were reviewed by me and considered in my medical decision making (see chart for details).   Patient presents to the emergency department with return of abdominal pain and diarrhea despite being on Cipro and Flagyl for diverticulitis.  CT scan from 10/27 reviewed along with GI note.  No complication on CT at that time.  Patient is having breakthrough symptoms worsening significantly in the last 24 hours.  Will unfortunately have to repeat the CT scan to rule out complicating factors such as microperforation or abscess.  Tachycardia noted here without fever.  Plan for screening lab work and reassess after CT.  CT with no abscess or perforation but progression of inflammatory changes despite appropriate outpatient abx. Labs reassuring. Plan for IV abx, NPO, IVF, and admit.   Discussed patient's case with [facility] to request admission. Patient and family (if present) updated with plan. Care transferred to [facility] service.  I reviewed all nursing notes, vitals, pertinent old records, EKGs, labs, imaging (as available).  ____________________________________________  FINAL CLINICAL IMPRESSION(S) / ED DIAGNOSES  Final diagnoses:  Diverticulitis     MEDICATIONS GIVEN DURING THIS VISIT:  Medications  lactated ringers infusion ( Intravenous New Bag/Given 08/07/19 1309)  acetaminophen (TYLENOL) tablet 325-650 mg (has no administration in time range)  nitroGLYCERIN (NITROSTAT) SL tablet 0.4 mg (has no administration in time range)  sodium chloride flush (NS) 0.9 % injection 3 mL (has no administration in time range)  sodium chloride flush (NS) 0.9 % injection 3 mL (has no administration in time range)  0.9 %  sodium chloride infusion (has no administration in time range)  acetaminophen (TYLENOL) tablet 650 mg (has no administration in time range)    Or  acetaminophen (TYLENOL) suppository 650 mg (has no administration in time range)  traZODone (DESYREL)  tablet 50 mg (has no administration in time range)  polyethylene glycol (MIRALAX / GLYCOLAX) packet 17 g (has no administration in time range)  ondansetron (ZOFRAN) tablet 4 mg (has no administration in time range)    Or  ondansetron (ZOFRAN) injection 4 mg (has no administration in time range)  albuterol (PROVENTIL) (2.5 MG/3ML) 0.083% nebulizer solution 2.5 mg (has no administration in time range)  heparin injection 5,000 Units (5,000 Units Subcutaneous Given 08/07/19 1336)  morphine 2 MG/ML injection 2 mg (2 mg Intravenous Given 08/07/19 1834)  oxyCODONE (Oxy IR/ROXICODONE) immediate release tablet 5 mg (has no administration in time range)  piperacillin-tazobactam (ZOSYN) IVPB 3.375 g (3.375 g Intravenous New Bag/Given 08/07/19 1839)  0.9 % NaCl with KCl 20 mEq/ L  infusion ( Intravenous New Bag/Given 08/07/19 1834)  sodium chloride 0.9 % bolus 500 mL (0 mLs Intravenous Stopped 08/07/19 1022)  morphine 4 MG/ML injection 4 mg (4 mg Intravenous Given 08/07/19 0752)  ondansetron (ZOFRAN) injection 4 mg (4 mg Intravenous Given 08/07/19 0749)  iohexol (OMNIPAQUE) 300 MG/ML solution 100 mL (100 mLs Intravenous Contrast Given 08/07/19 0938)  piperacillin-tazobactam (ZOSYN) IVPB 3.375 g (0 g Intravenous  Stopped 08/07/19 1136)    Note:  This document was prepared using Dragon voice recognition software and may include unintentional dictation errors.  Nanda Quinton, MD, [facility]    Revel Stellmach, Wonda Olds, MD 08/07/19 [phone]

## 2019-08-08 ENCOUNTER — Telehealth: Payer: Self-pay | Admitting: Gastroenterology

## 2019-08-08 LAB — CBC
HCT: 54.4 % — ABNORMAL HIGH (ref 39.0–52.0)
Hemoglobin: 17.2 g/dL — ABNORMAL HIGH (ref 13.0–17.0)
MCH: 30.3 pg (ref 26.0–34.0)
MCHC: 31.6 g/dL (ref 30.0–36.0)
MCV: 95.9 fL (ref 80.0–100.0)
Platelets: 317 10*3/uL (ref 150–400)
RBC: 5.67 MIL/uL (ref 4.22–5.81)
RDW: 13.1 % (ref 11.5–15.5)
WBC: 9.2 10*3/uL (ref 4.0–10.5)
nRBC: 0 % (ref 0.0–0.2)

## 2019-08-08 LAB — SARS CORONAVIRUS 2 (TAT 6-24 HRS): SARS Coronavirus 2: NEGATIVE

## 2019-08-08 NOTE — Progress Notes (Signed)
Patient Demographics:    Juan Lopez, is a 38 y.o. male, DOB - Mar 03, 1981, [id number]  Admit date - 08/07/2019   Admitting Physician Juan Brickell Denton Brick, MD  Outpatient Primary MD for the patient is Juan School, MD  LOS - 1   Chief Complaint  Patient presents with   Abdominal Pain        Subjective:    Juan Lopez today has no fevers, no emesis,  No chest pain, no further diarrhea -Abdominal pain is not worse  Assessment  & Plan :    Principal Problem:   Recurrent acute sigmoid diverticulitis Active Problems:   Essential hypertension   Fatty liver   Diarrhea   Obesity (BMI 30-39.9)   Acute diverticulitis  Brief summary 38 y.o. male with past medical history relevant for obesity, fatty liver, recurrent diverticulitis, history of HTN and myopericarditis admitted on 08/07/2019 with acute sigmoid diverticulitis after failing outpatient treatment with Cipro and Flagyl for 8 days PTA  A/p  1) recurrent persistent diverticulitis--- failed Cipro and Flagyl PTA for 8 days,  -Repeat CT abdomen and pelvis with persistent diverticulitis and worsening inflammatory changes but no definite abscess or frank perforation  -WBC down to 9.2 from 11.6 -GI consult from Juan Lopez appreciated, ??  If he needs elective sigmoid colectomy down the road -c/n  IV Zosyn, if improved may discharge on p.o. Augmentin with outpatient follow-up with GI -IV and oral pain medications as indicated -Antinausea medicine as needed  2) chronic diarrhea-----patient follows with GI as outpatient, may use Imodium as needed, maintain adequate hydration and replace electrolytes  3) history of hypertension--- Per patient he has been off his antihypertensives after losing weight,  may use IV labetalol when necessary  Every 4 hours for systolic blood pressure over 160 mmhg  4) history of fatty liver and obesity--patient  has lost some weight apparently BMI is down to 35, -Follow-up as outpatient with GI service for ongoing monitoring of his fatty liver -Encouraged to continue to lose weight  5) history of myopericarditis--- diagnosed January 2018, stable--last known EF based on echo from January 2019 55 to 60%  Disposition/Need for in-Hospital Stay- patient unable to be discharged at this time due to --- diverticulitis failed oral antibiotics requiring IV antibiotics*  Code Status : full  Family Communication:   NA (patient is alert, awake and coherent)   Disposition Plan  :  TBD   Consults  :  Gi  DVT Prophylaxis  :  - Heparin - SCDs    Lab Results  Component Value Date   PLT 317 08/08/2019    Inpatient Medications  Scheduled Meds:  heparin  5,000 Units Subcutaneous Q8H   sodium chloride flush  3 mL Intravenous Q12H   Continuous Infusions:  sodium chloride     0.9 % NaCl with KCl 20 mEq / L 50 mL/hr at 08/08/19 0836   lactated ringers 125 mL/hr at 08/07/19 1309   piperacillin-tazobactam (ZOSYN)  IV 3.375 g (08/08/19 1739)   PRN Meds:.sodium chloride, acetaminophen **OR** acetaminophen, acetaminophen, albuterol, morphine injection, nitroGLYCERIN, ondansetron **OR** ondansetron (ZOFRAN) IV, oxyCODONE, polyethylene glycol, sodium chloride flush, traZODone    Anti-infectives (From admission, onward)   Start     Dose/Rate Route Frequency Ordered Stop  08/07/19 1800  piperacillin-tazobactam (ZOSYN) IVPB 3.375 g     3.375 g 12.5 mL/hr over 240 Minutes Intravenous Every 8 hours 08/07/19 1053     08/07/19 1200  piperacillin-tazobactam (ZOSYN) IVPB 3.375 g  Status:  Discontinued     3.375 g 100 mL/hr over 30 Minutes Intravenous Every 6 hours 08/07/19 0954 08/07/19 1053   08/07/19 1100  piperacillin-tazobactam (ZOSYN) IVPB 3.375 g     3.375 g 100 mL/hr over 30 Minutes Intravenous  Once 08/07/19 1053 08/07/19 1136        Objective:   Vitals:   08/07/19 1300 08/07/19 2110 08/08/19  0515 08/08/19 1458  BP: 121/90 111/78 113/79 134/89  Pulse: 73 70 69 73  Resp: 19 20 16 18   Temp: 98.2 F (36.8 C) 98.8 F (37.1 C) 98.4 F (36.9 C) 98.4 F (36.9 C)  TempSrc: Oral Oral Oral Oral  SpO2:  93% 95% 90%  Weight: 118.8 kg     Height: 6' (1.829 m)       Wt Readings from Last 3 Encounters:  08/07/19 118.8 kg  07/31/19 121.7 kg  03/04/19 120.2 kg     Intake/Output Summary (Last 24 hours) at 08/08/2019 1949 Last data filed at 08/08/2019 0500 Gross per 24 hour  Intake 1607.66 ml  Output --  Net 1607.66 ml     Physical Exam  Gen:- Awake Alert,  In no apparent distress  HEENT:- [place].AT, No sclera icterus Neck-Supple Neck,No JVD,.  Lungs-  CTAB , fair symmetrical air movement CV- S1, S2 normal, regular  Abd-  +ve B.Sounds, Abd Soft, lower abdominal tenderness persist  extremity/Skin:- No  edema, pedal pulses present  Psych-affect is appropriate, oriented x3 Neuro-no new focal deficits, no tremors   Data Review:   Micro Results Recent Results (from the past 240 hour(s))  SARS CORONAVIRUS 2 (TAT 6-24 HRS)     Status: None   Collection Time: 08/07/19  4:30 PM  Result Value Ref Range Status   SARS Coronavirus 2 NEGATIVE NEGATIVE Final    Comment: (NOTE) SARS-CoV-2 target nucleic acids are NOT DETECTED. The SARS-CoV-2 RNA is generally detectable in upper and lower respiratory specimens during the acute phase of infection. Negative results do not preclude SARS-CoV-2 infection, do not rule out co-infections with other pathogens, and should not be used as the sole basis for treatment or other patient management decisions. Negative results must be combined with clinical observations, patient history, and epidemiological information. The expected result is Negative. Fact Sheet for Patients: SugarRoll.be Fact Sheet for Healthcare Providers: https://www.woods-mathews.com/ This test is not yet approved or cleared by the  [place] FDA and  has been authorized for detection and/or diagnosis of SARS-CoV-2 by FDA under an Emergency Use Authorization (EUA). This EUA will remain  in effect (meaning this test can be used) for the duration of the COVID-19 declaration under Section 56 4(b)(1) of the Act, 21 U.S.C. section 360bbb-3(b)(1), unless the authorization is terminated or revoked sooner. Performed at [facility], [place] [address] [place], [place] [address]     Radiology Reports Ct Abdomen Pelvis W Contrast  Result Date: 08/07/2019 CLINICAL DATA:  Abdominal pain, history of recent diverticulitis. EXAM: CT ABDOMEN AND PELVIS WITH CONTRAST TECHNIQUE: Multidetector CT imaging of the abdomen and pelvis was performed using the standard protocol following bolus administration of intravenous contrast. CONTRAST:  110mL OMNIPAQUE IOHEXOL 300 MG/ML  SOLN COMPARISON:  07/31/2019 FINDINGS: Lower chest: No acute abnormality. Hepatobiliary: Fatty infiltration of the liver is noted. The  gallbladder is within normal limits. Pancreas: Unremarkable. No pancreatic ductal dilatation or surrounding inflammatory changes. Spleen: Normal in size without focal abnormality. Adrenals/Urinary Tract: Adrenal glands are within normal limits. Kidneys demonstrate normal excretion bilaterally. No renal mass lesion is seen. No obstructive changes are noted. The bladder is partially distended. Stomach/Bowel: There again changes consistent with sigmoid diverticulitis. Slight increase in pericolonic inflammatory change is noted. No evidence of perforation or focal abscess formation is noted at this time. Persistent wall thickening is seen. The more proximal colon demonstrates diverticular change without focal abnormality. The appendix is well visualized with inspissated barium. Small bowel is within normal limits. The stomach is decompressed. Vascular/Lymphatic: No significant vascular findings are present. No enlarged abdominal or pelvic lymph  nodes. Reproductive: Prostate is unremarkable. Other: No abdominal wall hernia or abnormality. No abdominopelvic ascites. Musculoskeletal: No acute or significant osseous findings. IMPRESSION: Persistent diverticulitis. The degree of pericolonic inflammatory change has increased somewhat in the interval from the prior exam although no drainable collection or perforation is identified at this time. No other focal abnormality is noted. Electronically Signed   By: Inez Catalina M.D.   On: 08/07/2019 09:45   Ct Abdomen Pelvis W Contrast  Result Date: 07/31/2019 CLINICAL DATA:  Lower abdominal pain x5 days EXAM: CT ABDOMEN AND PELVIS WITH CONTRAST TECHNIQUE: Multidetector CT imaging of the abdomen and pelvis was performed using the standard protocol following bolus administration of intravenous contrast. CONTRAST:  112mL OMNIPAQUE IOHEXOL 300 MG/ML  SOLN COMPARISON:  08/12/2018 FINDINGS: Lower chest: Lung bases are essentially clear. Hepatobiliary: Hepatic steatosis with focal fatty sparing. Gallbladder is unremarkable. No intrahepatic or extrahepatic ductal dilatation. Pancreas: Within normal limits. Spleen: Within normal limits. Adrenals/Urinary Tract: Adrenal glands are within normal limits. Kidneys are within normal limits.  No hydronephrosis. Bladder is underdistended but unremarkable. Stomach/Bowel: Stomach is within normal limits. No evidence of bowel obstruction. Normal appendix (series 2/image 78). Sigmoid diverticulosis with wall thickening/inflammatory changes along the proximal sigmoid colon in the anterior mid abdomen (coronal image 43), suggesting sigmoid diverticulitis. No drainable fluid collection/abscess. No free air to suggest macroscopic perforation. Vascular/Lymphatic: No evidence of abdominal aortic aneurysm. No suspicious abdominopelvic lymphadenopathy. Reproductive: Prostate is unremarkable. Other: No abdominopelvic ascites. Musculoskeletal: Visualized osseous structures are within normal  limits. IMPRESSION: Acute sigmoid diverticulitis. No drainable fluid collection/abscess. No free air. Electronically Signed   By: Julian Hy M.D.   On: 07/31/2019 11:41     CBC Recent Labs  Lab 08/07/19 0731 08/08/19 0402  WBC 11.6* 9.2  HGB 18.2* 17.2*  HCT 55.5* 54.4*  PLT 319 317  MCV 93.4 95.9  MCH 30.6 30.3  MCHC 32.8 31.6  RDW 12.8 13.1  LYMPHSABS 2.5  --   MONOABS 1.0  --   EOSABS 0.1  --   BASOSABS 0.1  --     Chemistries  Recent Labs  Lab 08/07/19 0731  NA 135  K 3.4*  CL 100  CO2 27  GLUCOSE 103*  BUN 9  CREATININE 0.86  CALCIUM 8.7*  AST 13*  ALT 20  ALKPHOS 79  BILITOT 0.5   ------------------------------------------------------------------------------------------------------------------ No results for input(s): CHOL, HDL, LDLCALC, TRIG, CHOLHDL, LDLDIRECT in the last 72 hours.  No results found for: HGBA1C ------------------------------------------------------------------------------------------------------------------ No results for input(s): TSH, T4TOTAL, T3FREE, THYROIDAB in the last 72 hours.  Invalid input(s): FREET3 ------------------------------------------------------------------------------------------------------------------ No results for input(s): VITAMINB12, FOLATE, FERRITIN, TIBC, IRON, RETICCTPCT in the last 72 hours.  Coagulation profile No results for input(s): INR, PROTIME in the last 168  hours.  No results for input(s): DDIMER in the last 72 hours.  Cardiac Enzymes No results for input(s): CKMB, TROPONINI, MYOGLOBIN in the last 168 hours.  Invalid input(s): CK ------------------------------------------------------------------------------------------------------------------ No results found for: BNP   Roxan Hockey M.D on 08/08/2019 at 7:49 PM  Go to [email or web address] - for contact info  [facility] - Office  [phone]

## 2019-08-08 NOTE — Telephone Encounter (Signed)
Patient inpatient for diverticulitis. I spoke with Dr. Denton Brick this morning, who cancelled GI consult. He has a follow-up already arranged for Dec 2020.

## 2019-08-08 NOTE — Consult Note (Signed)
Referring Provider: No ref. provider found Primary Care Physician:  Redmond School, MD Primary Gastroenterologist:  Dr. Barney Drain   Reason for Consultation: Diverticulitis   HPI: Juan Lopez is a 38 y.o. male with a significant past medical history of hypertension, coronary artery disease, acute myopericarditis 10/2016, fatty liver and diverticulitis.  He developed central lower abdominal pain and he was concerned he had diverticulitis again. He contacted Walden Field NP at our [facility] and he was seen in office 07/31/2019. Labs showed a WBC 11.1.  Hemoglobin 7.6.  Hematocrit 55.0.  CMP was normal.  An abdominal/pelvic CT scan with contrast 07/31/2019 identified acute sigmoid diverticulitis without abscess or perforation.  He was prescribed Cipro 500 mg p.o. twice daily and Flagyl 500 mg p.o. twice daily. He called our [place] office on 08/06/2019 and he reported his abdominal pain had improved but he was having nonbloody diarrhea and abdominal cramping for the past 2 days.  His symptoms worsened and he presented to [facility] on 08/07/2019.  WBC 9.2.  A repeat abdominal/pelvic CT identified persistent diverticulitis with increased pericolonic inflammatory. No evidence of a perforation or abscess was identified.  Zosyn 3.375 mg IV every 8 hours was initiated.   He reports having similar episodes of diverticulitis twice a year, usually in October and November, for the past 3 to 4 years. He denies having any constipation or stress triggers. This is the first diverticulitis episode which required hospital admission. He's never seen a surgeon to discuss having left colon resection. He underwent a colonoscopy by Dr. Oneida Alar 11/02/2017 which showed a redundant colon, one polyp was removed but biopsies showed normal colon mucosa and this was not a true polyp, no evidence of colitis. His mother had a colon resection secondary to colon polyps. No family history of colorectal cancer.  He reports intentionally  losing 50 pounds since January 2020 by eating a healthier diet.  His hypertension resolved after he lost his weight and he no longer requires blood pressure medications.  He denies taking any NSAIDs.  He denies any alcohol or drug use.  ED course: Sodium 135.  Potassium 3.4.  Glucose 103.  BUN 9.  Creatinine 0.86.  Anion gap 8.  Alk phos 79.  Albumin 4.0.  Lipase 24.  AST 13.  ALT 20.  Total bili 0.5.  Lactic acid 0.8.  WBC 11.6.  Hemoglobin 18.2.  Hematocrit 55.5.  MCV 93.4.  Platelet 319.  SARS coronavirus 2 negative.   Labs 08/08/2019: WBC 9.2.  Hemoglobin 7.2.  Hematocrit 54.4.  Platelet 317.  An abdominal/pelvic CT 08/07/2019: Persistent diverticulitis. The degree of pericolonic inflammatory change has increased somewhat in the interval from the prior exam although no drainable collection or perforation is identified at this time. No other focal abnormality is noted.  Colonoscopy 11/02/2017:  - The examined portion of the ileum was normal. - One 3 mm polyp in the proximal ascending colon, removed with a cold biopsy forceps. Resected and retrieved. Biopsies showed benign colonic mucosa and not a true polyp. - Redundant colon. - Diverticulosis in the recto-sigmoid colon and in the sigmoid colon. - External and internal hemorrhoids.   Past Medical History:  Diagnosis Date   Acute myopericarditis    a. dx 10/2016   CAD (coronary artery disease)    a. LHC 10/2016 with mild luminal irregularities. Placed on statin    Diverticulitis    Family history of early CAD    HTN (hypertension)     Past  Surgical History:  Procedure Laterality Date   CARDIAC CATHETERIZATION [name] 10/16/2016   Procedure: Left Heart Cath and Coronary Angiography;  Surgeon: Peter M Martinique, MD;  Location: [place] [facility];  Service: Cardiovascular;  Laterality: [name];   COLONOSCOPY [name] 11/14/2017   Dr. Oneida Alar: 3 mm polyp from proximal ascending colon removed, rectosigmoid colon and sigmoid colon diverticula,  external and internal hemorrhoids, random colon biopsies were benign.   POLYPECTOMY  11/14/2017   Procedure: POLYPECTOMY;  Surgeon: Danie Binder, MD;  Location: [facility];  Service: Endoscopy;;  Ascending colon (CS)   TONSILLECTOMY     WISDOM TOOTH EXTRACTION     AGE 13-25    Prior to Admission medications   Medication Sig Start Date End Date Taking? Authorizing Provider  acetaminophen (TYLENOL) 325 MG tablet Take 325-650 mg by mouth every 6 (six) hours as needed for moderate pain. For pain   Yes [provider]  ciprofloxacin (CIPRO) 500 MG tablet Take 1 tablet (500 mg total) by mouth 2 (two) times daily for 10 days. 07/30/19 08/09/19 Yes Mahala Menghini, PA-C  metroNIDAZOLE (FLAGYL) 500 MG tablet Take 1 tablet (500 mg total) by mouth 3 (three) times daily for 10 days. 07/30/19 08/09/19 Yes Mahala Menghini, PA-C  nitroGLYCERIN (NITROSTAT) 0.4 MG SL tablet Place 1 tablet (0.4 mg total) under the tongue every 5 (five) minutes x 3 doses as needed for chest pain. 10/17/16  Yes Eileen Stanford, PA-C    Current Facility-Administered Medications  Medication Dose Route Frequency Provider Last Rate Last Dose   0.9 %  sodium chloride infusion  250 mL Intravenous PRN Emokpae, Courage, MD       0.9 % NaCl with KCl 20 mEq/ L  infusion   Intravenous Continuous Denton Brick, Courage, MD 50 mL/hr at 08/08/19 0836     acetaminophen (TYLENOL) tablet 650 mg  650 mg Oral Q6H PRN Roxan Hockey, MD       Or   acetaminophen (TYLENOL) suppository 650 mg  650 mg Rectal Q6H PRN Emokpae, Courage, MD       acetaminophen (TYLENOL) tablet 325-650 mg  325-650 mg Oral Q6H PRN Emokpae, Courage, MD       albuterol (PROVENTIL) (2.5 MG/3ML) 0.083% nebulizer solution 2.5 mg  2.5 mg Nebulization Q2H PRN Emokpae, Courage, MD       heparin injection 5,000 Units  5,000 Units Subcutaneous Q8H Emokpae, Courage, MD   5,000 Units at 08/08/19 0618   lactated ringers infusion   Intravenous Continuous Emokpae,  Courage, MD 125 mL/hr at 08/07/19 1309     morphine 2 MG/ML injection 2 mg  2 mg Intravenous Q4H PRN Roxan Hockey, MD   2 mg at 08/08/19 0834   nitroGLYCERIN (NITROSTAT) SL tablet 0.4 mg  0.4 mg Sublingual Q5 Min x 3 PRN Emokpae, Courage, MD       ondansetron (ZOFRAN) tablet 4 mg  4 mg Oral Q6H PRN Emokpae, Courage, MD       Or   ondansetron (ZOFRAN) injection 4 mg  4 mg Intravenous Q6H PRN Emokpae, Courage, MD       oxyCODONE (Oxy IR/ROXICODONE) immediate release tablet 5 mg  5 mg Oral Q4H PRN Emokpae, Courage, MD   5 mg at 08/07/19 2142   piperacillin-tazobactam (ZOSYN) IVPB 3.375 g  3.375 g Intravenous Q8H Emokpae, Courage, MD 12.5 mL/hr at 08/08/19 0235 3.375 g at 08/08/19 0235   polyethylene glycol (MIRALAX / GLYCOLAX) packet 17 g  17 g Oral Daily PRN  Roxan Hockey, MD       sodium chloride flush (NS) 0.9 % injection 3 mL  3 mL Intravenous Q12H Emokpae, Courage, MD       sodium chloride flush (NS) 0.9 % injection 3 mL  3 mL Intravenous PRN Emokpae, Courage, MD       traZODone (DESYREL) tablet 50 mg  50 mg Oral QHS PRN Roxan Hockey, MD        Allergies as of 08/07/2019 - Review Complete 08/07/2019  Allergen Reaction Noted   Bee venom Nausea And Vomiting 03/27/2014    Family History  Problem Relation Age of Onset   Diabetes Mother    Heart failure Mother    Colon polyps Mother        REQUIRED COLECTOMY   Heart failure Father    Heart attack Maternal Grandmother    Pneumonia Maternal Grandfather    Diabetes Paternal Grandmother    Obesity Paternal Grandmother    Heart attack Paternal Grandfather    Colon cancer Neg Hx     Social History   Socioeconomic History   Marital status: Married    Spouse name: Not on file   Number of children: Not on file   Years of education: Not on file   Highest education level: Not on file  Occupational History   Not on file  Social Needs   Financial resource strain: Not on file   Food insecurity     Worry: Not on file    Inability: Not on file   Transportation needs    Medical: Not on file    Non-medical: Not on file  Tobacco Use   Smoking status: Current Some Day Smoker    Packs/day: 0.50   Smokeless tobacco: Never Used   Tobacco comment: Only smokes when at work; doesn't smoke at home around children  Substance and Sexual Activity   Alcohol use: Not Currently    Comment: rare   Drug use: No   Sexual activity: Not on file  Lifestyle   Physical activity    Days per week: Not on file    Minutes per session: Not on file   Stress: Not on file  Relationships   Social connections    Talks on phone: Not on file    Gets together: Not on file    Attends religious service: Not on file    Active member of club or organization: Not on file    Attends meetings of clubs or organizations: Not on file    Relationship status: Not on file   Intimate partner violence    Fear of current or ex partner: Not on file    Emotionally abused: Not on file    Physically abused: Not on file    Forced sexual activity: Not on file  Other Topics Concern   Not on file  Social History Narrative   WORKING NOW BUT [place]. MARRIED: 2 YRS AND 15 YRS TOGETHER. NO CIGS OR ETOH.  SON AGE 13 AND HAD A DAUGHTER 3 WEEKS AGO(JAN 2019).     Review of Systems: Gen: Denies fever, sweats or chills.  See HPI. CV: Denies chest pain, palpitations or edema. Resp: Denies cough, shortness of breath of hemoptysis.  GI: See HPI.  No GERD symptoms. GU : Denies urinary burning, blood in urine, increased urinary frequency or incontinence. MS: Denies joint pain, muscles aches or weakness. Derm: Denies rash, itchiness, skin lesions or unhealing ulcers. Psych: Denies depression, anxiety, memory loss, suicidal  ideation and confusion. Heme: Denies bruising, bleeding. Neuro:  Denies headaches, dizziness or paresthesias. Endo:  Denies any problems with DM, thyroid or adrenal function.  Physical  Exam: Vital signs in last 24 hours: Temp:  [98.2 F (36.8 C)-98.8 F (37.1 C)] 98.4 F (36.9 C) (11/04 0515) Pulse Rate:  [69-73] 69 (11/04 0515) Resp:  [16-20] 16 (11/04 0515) BP: (111-125)/(73-93) 113/79 (11/04 0515) SpO2:  [93 %-99 %] 95 % (11/04 0515) Weight:  [387.5 kg] 118.8 kg (11/03 1300) Last BM Date: 08/06/19 General:   Alert,  well-developed, well-nourished, pleasant and cooperative in NAD. Head:  Normocephalic and atraumatic. Eyes:  Sclera clear, no icterus. Conjunctiva pink. Ears:  Normal auditory acuity. Nose:  No deformity, discharge or lesions. Mouth:  No deformity or lesions.   Neck:  Supple. Lungs: Breath sounds clear throughout. Heart: Regular rate and rhythm, no murmurs. Abdomen: Abdomen soft, nondistended, mild tenderness to the central lower abdomen without rebound or guarding, positive bowel sounds to all 4 quadrants, no HSM. Rectal:  Deferred  Msk:  Symmetrical without gross deformities. . Pulses:  Normal pulses noted. Extremities:  Without clubbing or edema. Neurologic:  Alert and  oriented x4;  grossly normal neurologically. Skin:  Intact without significant lesions or rashes.. Psych:  Alert and cooperative. Normal mood and affect.  Intake/Output from previous day: 11/03 0701 - 11/04 0700 In: 2087.7 [P.O.:480; I.V.:1527.6; IV Piggyback:80] Out: -  Intake/Output this shift: No intake/output data recorded.  Lab Results: Recent Labs    08/07/19 0731 08/08/19 0402  WBC 11.6* 9.2  HGB 18.2* 17.2*  HCT 55.5* 54.4*  PLT 319 317   BMET Recent Labs    08/07/19 0731  NA 135  K 3.4*  CL 100  CO2 27  GLUCOSE 103*  BUN 9  CREATININE 0.86  CALCIUM 8.7*   LFT Recent Labs    08/07/19 0731  PROT 7.0  ALBUMIN 4.0  AST 13*  ALT 20  ALKPHOS 79  BILITOT 0.5   PT/INR No results for input(s): LABPROT, INR in the last 72 hours. Hepatitis Panel No results for input(s): HEPBSAG, HCVAB, HEPAIGM, HEPBIGM in the last 72  hours.    Studies/Results: Ct Abdomen Pelvis W Contrast  Result Date: 08/07/2019 CLINICAL DATA:  Abdominal pain, history of recent diverticulitis. EXAM: CT ABDOMEN AND PELVIS WITH CONTRAST TECHNIQUE: Multidetector CT imaging of the abdomen and pelvis was performed using the standard protocol following bolus administration of intravenous contrast. CONTRAST:  166m OMNIPAQUE IOHEXOL 300 MG/ML  SOLN COMPARISON:  07/31/2019 FINDINGS: Lower chest: No acute abnormality. Hepatobiliary: Fatty infiltration of the liver is noted. The gallbladder is within normal limits. Pancreas: Unremarkable. No pancreatic ductal dilatation or surrounding inflammatory changes. Spleen: Normal in size without focal abnormality. Adrenals/Urinary Tract: Adrenal glands are within normal limits. Kidneys demonstrate normal excretion bilaterally. No renal mass lesion is seen. No obstructive changes are noted. The bladder is partially distended. Stomach/Bowel: There again changes consistent with sigmoid diverticulitis. Slight increase in pericolonic inflammatory change is noted. No evidence of perforation or focal abscess formation is noted at this time. Persistent wall thickening is seen. The more proximal colon demonstrates diverticular change without focal abnormality. The appendix is well visualized with inspissated barium. Small bowel is within normal limits. The stomach is decompressed. Vascular/Lymphatic: No significant vascular findings are present. No enlarged abdominal or pelvic lymph nodes. Reproductive: Prostate is unremarkable. Other: No abdominal wall hernia or abnormality. No abdominopelvic ascites. Musculoskeletal: No acute or significant osseous findings. IMPRESSION: Persistent diverticulitis. The degree of  pericolonic inflammatory change has increased somewhat in the interval from the prior exam although no drainable collection or perforation is identified at this time. No other focal abnormality is noted. Electronically  Signed   By: Inez Catalina M.D.   On: 08/07/2019 09:45    IMPRESSION/PLAN:  12.  38 year old male with a history of recurrent diverticulitis presents with sigmoid diverticulitis which failed oral antibiotics admitted with nonbloody diarrhea and lower abdominal pain.  A repeat abdominal/pelvic CT consistent with persistent sigmoid diverticulitis with slight increase in pericolonic inflammatory changes noted when compared to prior CT.  Possible contained microperforation per Dr. Olevia Perches review of CT. No colitis.  WBC count is drifting downward to 9.2 from 11.6.  He is afebrile. -Clear liquid diet for now -Continue Zosyn IV -If diarrhea recurs GI panel and C. Diff PCR will be ordered -Florastor probiotic 1 po bid  -Recommend surgical consult as patient with hx of diverticulitis twice yearly for the past 3 to 4 years at risk for colon perforation -Pain management per the hospitalist  2.  Family history positive for colon polyps, mother required a colectomy.   3.  History of hepatic steatosis. Patient has electively lost 50lbs with diet changes since 10/2018  4. History of acute myopericarditis 10/2016 occurred after he received a flu and tetanus injection  5.  Family history of early CAD, patient is unable to clarify his personal history of possible CAD  Further recommendations per Dr. Melony Overly later today       Patrecia Pour [facility]  08/08/2019, 9:04 AM

## 2019-08-09 ENCOUNTER — Telehealth: Payer: Self-pay | Admitting: Gastroenterology

## 2019-08-09 LAB — BASIC METABOLIC PANEL
Anion gap: 10 (ref 5–15)
BUN: 8 mg/dL (ref 6–20)
CO2: 27 mmol/L (ref 22–32)
Calcium: 8.8 mg/dL — ABNORMAL LOW (ref 8.9–10.3)
Chloride: 102 mmol/L (ref 98–111)
Creatinine, Ser: 0.95 mg/dL (ref 0.61–1.24)
GFR calc Af Amer: >60 mL/min (ref >60)
GFR calc non Af Amer: >60 mL/min (ref >60)
Glucose, Bld: 99 mg/dL (ref 70–99)
Potassium: 4.1 mmol/L (ref 3.5–5.1)
Sodium: 139 mmol/L (ref 135–145)

## 2019-08-09 MED ORDER — AMOXICILLIN-POT CLAVULANATE 875-125 MG PO TABS: 1.0000 | ORAL_TABLET | Freq: Two times a day (BID) | ORAL | 0 refills | Status: AC

## 2019-08-09 MED ORDER — OXYCODONE HCL 5 MG PO TABS: 5.0000 mg | ORAL_TABLET | Freq: Four times a day (QID) | ORAL | 0 refills | Status: DC | PRN

## 2019-08-09 MED ORDER — ONDANSETRON HCL 4 MG PO TABS: 4.0000 mg | ORAL_TABLET | Freq: Four times a day (QID) | ORAL | 0 refills | Status: DC | PRN

## 2019-08-09 NOTE — Discharge Instructions (Signed)
1) please take Augmentin antibiotic as prescribed--  2) please follow-up with your gastroenterologist as scheduled for recheck and reevaluation 3) please follow-up with Dr. Curlene Labrum general surgeon to discuss surgical options for your recurrent sigmoid diverticulitis -[facility] [address] [place], [place] [id number] [phone]

## 2019-08-09 NOTE — Discharge Summary (Signed)
Juan Lopez, is a 38 y.o. male  DOB Oct 02, 1981  MRN [id number].  Admission date:  08/07/2019  Admitting Physician  Roxan Hockey, MD  Discharge Date:  08/09/2019   Primary MD  Redmond School, MD  Recommendations for primary care physician for things to follow:  - 1) please take Augmentin antibiotic as prescribed--  2) please follow-up with your gastroenterologist as scheduled for recheck and reevaluation 3) please follow-up with Dr. Curlene Labrum general surgeon to discuss surgical options for your recurrent sigmoid diverticulitis -[facility] [address] [name], [place] [address] [phone]  Admission Diagnosis  Diverticulitis [K57.92]   Discharge Diagnosis  Diverticulitis [K57.92]    Principal Problem:   Recurrent acute sigmoid diverticulitis Active Problems:   Essential hypertension   Fatty liver   Diarrhea   Obesity (BMI 30-39.9)   Acute diverticulitis      Past Medical History:  Diagnosis Date   Acute myopericarditis    a. dx 10/2016   CAD (coronary artery disease)    a. LHC 10/2016 with mild luminal irregularities. Placed on statin    Diverticulitis    Family history of early CAD    HTN (hypertension)     Past Surgical History:  Procedure Laterality Date   CARDIAC CATHETERIZATION [name] 10/16/2016   Procedure: Left Heart Cath and Coronary Angiography;  Surgeon: Peter M Martinique, MD;  Location: [facility];  Service: Cardiovascular;  Laterality: [name];   COLONOSCOPY [name] 11/14/2017   Dr. Oneida Alar: 3 mm polyp from proximal ascending colon removed, rectosigmoid colon and sigmoid colon diverticula, external and internal hemorrhoids, random colon biopsies were benign.   POLYPECTOMY  11/14/2017   Procedure: POLYPECTOMY;  Surgeon: Danie Binder, MD;  [facility];  Service: Endoscopy;;  Ascending colon (CS)   TONSILLECTOMY       WISDOM TOOTH EXTRACTION     AGE 32-25       HPI  from the history and physical done on the day of admission:    - Juan Lopez  is a 38 y.o. male with past medical history relevant for obesity, fatty liver, recurrent diverticulitis, history of HTN and myopericarditis presents with worsening abdominal pain -Patient was diagnosed with another episode of diverticulitis, started on Cipro and Flagyl about 8 days ago, initially symptoms subsided but over the last couple days abdominal pain and chills have worsened -Repeat CT abdomen and pelvis today with worsening inflammatory changes consistent with persistent diverticulitis without abscess or perforation  CT abd /Pelvis -IMPRESSION: Persistent diverticulitis. The degree of pericolonic inflammatory change has increased somewhat in the interval from the prior exam although no drainable collection or perforation is identified at this time.  Labs with a creatinine of 0.86, lipase is 24 -WBC 11.6 hemoglobin 18.2 -Lactic acid is not elevated    Hospital Course:    -Brief summary 38 y.o.malewith past medical history relevant for obesity, fatty liver, recurrent diverticulitis, history of HTN and myopericarditis admitted on 08/07/2019 with acute sigmoid diverticulitis after failing outpatient treatment  with Cipro and Flagyl for 8 days PTA  A/p 1)Recurrent persistent diverticulitis---failed Cipro and Flagyl PTA for 8 days, -Repeat CT abdomen and pelvis with persistent diverticulitis and worsening inflammatory changes but no definite abscess or frank perforation -WBC down to 9.2 from 11.6 -GI consult from Dr. Laural Golden appreciated, ??  If he needs elective sigmoid colectomy down the road -treated with IV Zosyn, let's discharge on p.o. Augmentin with outpatient follow-up with GI and Gen surgery  2)Chronic Diarrhea-----patient follows with GI as outpatient, may use Imodium as needed,   3)history of hypertension---Per patient he  has been off his antihypertensives after losing weight, BP is stable  4)history of fatty liver and obesity--patient has lost some weight apparently BMI is down to 35, -Follow-up as outpatient with GI service for ongoing monitoring of his fatty liver -Encouraged to continue to lose weight  5)history of myopericarditis---diagnosed January 2018, stable--last known EF based on echo from January 2019 55 to 60% Stable, assymptomatic  Disposition- Home  Code Status : full  Disposition Plan  :  home  Consults  :  Gi  Discharge Condition: stable  Follow UP- Gi and Gen surgery as advised  Diet and Activity recommendation:  As advised  Discharge Instructions    Discharge Instructions    Call MD for:  difficulty breathing, headache or visual disturbances   Complete by: As directed    Call MD for:  persistant dizziness or light-headedness   Complete by: As directed    Call MD for:  persistant nausea and vomiting   Complete by: As directed    Call MD for:  severe uncontrolled pain   Complete by: As directed    Call MD for:  temperature >100.4   Complete by: As directed    Diet - low sodium heart healthy   Complete by: As directed    Soft Diet advised   Discharge instructions   Complete by: As directed    1) please take Augmentin antibiotic as prescribed--  2) please follow-up with your gastroenterologist as scheduled for recheck and reevaluation 3) please follow-up with Dr. Curlene Labrum general surgeon to discuss surgical options for your recurrent sigmoid diverticulitis -[facility] [address] [name], [place] [address] [phone]   Increase activity slowly   Complete by: As directed         Discharge Medications     Allergies as of 08/09/2019      Reactions   Bee Venom Nausea And Vomiting      Medication List    STOP taking these medications   ciprofloxacin 500 MG tablet Commonly known as: Cipro   metroNIDAZOLE 500  MG tablet Commonly known as: FLAGYL     TAKE these medications   acetaminophen 325 MG tablet Commonly known as: TYLENOL Take 325-650 mg by mouth every 6 (six) hours as needed for moderate pain. For pain   amoxicillin-clavulanate 875-125 MG tablet Commonly known as: Augmentin Take 1 tablet by mouth 2 (two) times daily for 10 days.   nitroGLYCERIN 0.4 MG SL tablet Commonly known as: NITROSTAT Place 1 tablet (0.4 mg total) under the tongue every 5 (five) minutes x 3 doses as needed for chest pain.   ondansetron 4 MG tablet Commonly known as: ZOFRAN Take 1 tablet (4 mg total) by mouth every 6 (six) hours as needed for nausea.   oxyCODONE 5 MG immediate release tablet Commonly known as: Oxy IR/ROXICODONE Take 1 tablet (5 mg total) by mouth every 6 (six) hours  as needed for moderate pain or severe pain.       Major procedures and Radiology Reports - PLEASE review detailed and final reports for all details, in brief -    Ct Abdomen Pelvis W Contrast  Result Date: 08/07/2019 CLINICAL DATA:  Abdominal pain, history of recent diverticulitis. EXAM: CT ABDOMEN AND PELVIS WITH CONTRAST TECHNIQUE: Multidetector CT imaging of the abdomen and pelvis was performed using the standard protocol following bolus administration of intravenous contrast. CONTRAST:  18mL OMNIPAQUE IOHEXOL 300 MG/ML  SOLN COMPARISON:  07/31/2019 FINDINGS: Lower chest: No acute abnormality. Hepatobiliary: Fatty infiltration of the liver is noted. The gallbladder is within normal limits. Pancreas: Unremarkable. No pancreatic ductal dilatation or surrounding inflammatory changes. Spleen: Normal in size without focal abnormality. Adrenals/Urinary Tract: Adrenal glands are within normal limits. Kidneys demonstrate normal excretion bilaterally. No renal mass lesion is seen. No obstructive changes are noted. The bladder is partially distended. Stomach/Bowel: There again changes consistent with sigmoid diverticulitis. Slight increase  in pericolonic inflammatory change is noted. No evidence of perforation or focal abscess formation is noted at this time. Persistent wall thickening is seen. The more proximal colon demonstrates diverticular change without focal abnormality. The appendix is well visualized with inspissated barium. Small bowel is within normal limits. The stomach is decompressed. Vascular/Lymphatic: No significant vascular findings are present. No enlarged abdominal or pelvic lymph nodes. Reproductive: Prostate is unremarkable. Other: No abdominal wall hernia or abnormality. No abdominopelvic ascites. Musculoskeletal: No acute or significant osseous findings. IMPRESSION: Persistent diverticulitis. The degree of pericolonic inflammatory change has increased somewhat in the interval from the prior exam although no drainable collection or perforation is identified at this time. No other focal abnormality is noted. Electronically Signed   By: Inez Catalina M.D.   On: 08/07/2019 09:45   Ct Abdomen Pelvis W Contrast  Result Date: 07/31/2019 CLINICAL DATA:  Lower abdominal pain x5 days EXAM: CT ABDOMEN AND PELVIS WITH CONTRAST TECHNIQUE: Multidetector CT imaging of the abdomen and pelvis was performed using the standard protocol following bolus administration of intravenous contrast. CONTRAST:  116mL OMNIPAQUE IOHEXOL 300 MG/ML  SOLN COMPARISON:  08/12/2018 FINDINGS: Lower chest: Lung bases are essentially clear. Hepatobiliary: Hepatic steatosis with focal fatty sparing. Gallbladder is unremarkable. No intrahepatic or extrahepatic ductal dilatation. Pancreas: Within normal limits. Spleen: Within normal limits. Adrenals/Urinary Tract: Adrenal glands are within normal limits. Kidneys are within normal limits.  No hydronephrosis. Bladder is underdistended but unremarkable. Stomach/Bowel: Stomach is within normal limits. No evidence of bowel obstruction. Normal appendix (series 2/image 78). Sigmoid diverticulosis with wall  thickening/inflammatory changes along the proximal sigmoid colon in the anterior mid abdomen (coronal image 43), suggesting sigmoid diverticulitis. No drainable fluid collection/abscess. No free air to suggest macroscopic perforation. Vascular/Lymphatic: No evidence of abdominal aortic aneurysm. No suspicious abdominopelvic lymphadenopathy. Reproductive: Prostate is unremarkable. Other: No abdominopelvic ascites. Musculoskeletal: Visualized osseous structures are within normal limits. IMPRESSION: Acute sigmoid diverticulitis. No drainable fluid collection/abscess. No free air. Electronically Signed   By: Julian Hy M.D.   On: 07/31/2019 11:41    Micro Results    Recent Results (from the past 240 hour(s))  SARS CORONAVIRUS 2 (TAT 6-24 HRS)     Status: None   Collection Time: 08/07/19  4:30 PM  Result Value Ref Range Status   SARS Coronavirus 2 NEGATIVE NEGATIVE Final    Comment: (NOTE) SARS-CoV-2 target nucleic acids are NOT DETECTED. The SARS-CoV-2 RNA is generally detectable in upper and lower respiratory specimens during the acute phase of  infection. Negative results do not preclude SARS-CoV-2 infection, do not rule out co-infections with other pathogens, and should not be used as the sole basis for treatment or other patient management decisions. Negative results must be combined with clinical observations, patient history, and epidemiological information. The expected result is Negative. Fact Sheet for Patients: SugarRoll.be Fact Sheet for Healthcare Providers: https://www.woods-mathews.com/ This test is not yet approved or cleared by the [place] FDA and  has been authorized for detection and/or diagnosis of SARS-CoV-2 by FDA under an Emergency Use Authorization (EUA). This EUA will remain  in effect (meaning this test can be used) for the duration of the COVID-19 declaration under Section 56 4(b)(1) of the Act, 21 U.S.C. section  360bbb-3(b)(1), unless the authorization is terminated or revoked sooner. Performed at [facility], [place] [address], [place], [place] [address]        Today   Subjective    Juan Lopez today has no new complaints Eating and drinking well, -Abdominal pain improved significantly No fever  Or chills   No Nausea, Vomiting or Diarrhea    Patient has been seen and examined prior to discharge   Objective   Blood pressure 117/87, pulse 66, temperature 98.3 F (36.8 C), resp. rate 16, height 6' (1.829 m), weight 118.8 kg, SpO2 99 %.   Intake/Output Summary (Last 24 hours) at 08/09/2019 1135 Last data filed at 08/09/2019 0831 Gross per 24 hour  Intake 1351.62 ml  Output --  Net 1351.62 ml    Exam Gen:- Awake Alert, no acute distress  HEENT:- .AT, No sclera icterus Neck-Supple Neck,No JVD,.  Lungs-  CTAB , good air movement bilaterally  CV- S1, S2 normal, regular Abd-  +ve B.Sounds, Abd Soft, mostly resolved lower abdominal tenderness Extremity/Skin:- No  edema,   good pulses Psych-affect is appropriate, oriented x3 Neuro-no new focal deficits, no tremors    Data Review   CBC w Diff:  Lab Results  Component Value Date   WBC 9.2 08/08/2019   HGB 17.2 (H) 08/08/2019   HCT 54.4 (H) 08/08/2019   PLT 317 08/08/2019   LYMPHOPCT 22 08/07/2019   MONOPCT 9 08/07/2019   EOSPCT 1 08/07/2019   BASOPCT 0 08/07/2019    CMP:  Lab Results  Component Value Date   NA 139 08/09/2019   K 4.1 08/09/2019   CL 102 08/09/2019   CO2 27 08/09/2019   BUN 8 08/09/2019   CREATININE 0.95 08/09/2019   PROT 7.0 08/07/2019   ALBUMIN 4.0 08/07/2019   BILITOT 0.5 08/07/2019   ALKPHOS 79 08/07/2019   AST 13 (L) 08/07/2019   ALT 20 08/07/2019  .   Total Discharge time is about 33 minutes  Roxan Hockey M.D on 08/09/2019 at 11:35 AM  Go to [email or web address] -  for contact info  [facility] - Office  [phone]

## 2019-08-09 NOTE — Progress Notes (Signed)
Discharge instructions reviewed with patient. Given AVS. Verbalized understanding of instructions, follow-up with PCP, Dr. Constance Haw and GI as instructed. Has GI appointment scheduled for 08/27/19 and states will call PCP and Dr. Constance Haw offices to arrange follow-up. IV site removed, site within normal limits. Pt left floor in stable condition accompanied by nursing staff. Discharged home. Donavan Foil, RN

## 2019-08-09 NOTE — Telephone Encounter (Signed)
Called patient and he is on the schedule for 08/27/19

## 2019-08-09 NOTE — Telephone Encounter (Signed)
Can we please move patient's appt up to week of 11/23?

## 2019-08-10 NOTE — Telephone Encounter (Signed)
Noted. Dx with acute diverticulitis. We can see him in follow-up.  Call if any problems.

## 2019-08-13 NOTE — Telephone Encounter (Signed)
PT states he is doing some better. He is aware of appointment with Neil Crouch, PA on 08/27/2019 at 11:30 AM.  He will call if he has problems before then.

## 2019-08-27 ENCOUNTER — Other Ambulatory Visit: Payer: Self-pay

## 2019-08-27 ENCOUNTER — Encounter: Payer: Self-pay | Admitting: Gastroenterology

## 2019-08-27 ENCOUNTER — Ambulatory Visit: Payer: 59 | Admitting: Gastroenterology

## 2019-08-27 VITALS — BP 144/96 | HR 85 | Temp 97.3°F | Ht 72.0 in | Wt 266.6 lb

## 2019-08-27 DIAGNOSIS — K5792 Diverticulitis of intestine, part unspecified, without perforation or abscess without bleeding: Secondary | ICD-10-CM

## 2019-08-27 NOTE — Patient Instructions (Signed)
1. Referral to Dr. Blake Divine for recurrent diverticulitis, consider elective partial colon resection. 2. Please take fiber supplement every day, 3 to 4 grams. FiberChoice chewables are good options as well as Benefiber powder or chewables.  3. Call if you have recurrent abdominal pain.

## 2019-08-27 NOTE — Progress Notes (Signed)
cc'ed to pcp °

## 2019-08-27 NOTE — Assessment & Plan Note (Signed)
Clinically improved. Reports at least 2 episodes of diverticulitis yearly for the past 3 to 4 years. Well-documented sigmoid diverticulitis on CT's. Previous colonoscopy with limited diverticulosis in the sigmoid and rectosigmoid region. Would encourage him to see Dr. Blake Divine but discussed possibility of elective segmental colectomy. Patient is very interested in pursuing. Referral made. In the interim if he has any recurrent abdominal pain he will let us know.  Urged him to continue high-fiber diet. Add 3 to 4 g of fiber supplement daily.

## 2019-08-27 NOTE — Progress Notes (Signed)
Primary Care Physician: Redmond School, MD  Primary Gastroenterologist:  Barney Drain, MD   Chief Complaint  Patient presents with   Abdominal Pain    pain has improved, comes/goes on occasion     HPI: Juan Lopez is a 38 y.o. male here for follow-up.  He was seen on October 27 presenting with diarrhea and diverticulitis. On October 27, CT showed sigmoid diverticulitis without evidence of perforation or abscess formation. Symptoms initially improved on Cipro and Flagyl but 1 week into treatment he had recurrence of symptoms. Resulted in hospitalization. Pete CT on November 3 again with changes consistent with sigmoid diverticulitis with slight increase in pericolonic inflammatory changes noted. Persistent wall thickening seen. No focal abscess or perforation.. With IV Zosyn and transition to Augmentin as an outpatient.  Multiple episodes of CT documented diverticulitis, November 2018, November 2019. Treated empirically back in May 2020 after virtual visit. Total of 5 CTs since 08/2017. Patient reports at least 2 episodes of diverticulitis yearly over the past 3 years.  Colonoscopy up-to-date February 2019 with a single polyp, diverticular, hemorrhoids, benign random colon biopsies.  Next due 2024.  Family history of significant polyps in his mother required colectomy.   Clinically doing much better. Completed antibiotic therapy. Bowel movements go from constipation to diarrhea which is normal for him. No melena or rectal bleeding. Some abdominal pain comes and goes. Appetite improved. Reports 60 pound intentional weight loss this year. Weight has stabilized over the last several months. Denies any upper GI symptoms.    Current Outpatient Medications  Medication Sig Dispense Refill   acetaminophen (TYLENOL) 325 MG tablet Take 325-650 mg by mouth every 6 (six) hours as needed for moderate pain. For pain     nitroGLYCERIN (NITROSTAT) 0.4 MG SL tablet Place 1 tablet (0.4 mg  total) under the tongue every 5 (five) minutes x 3 doses as needed for chest pain. 25 tablet 12   No current facility-administered medications for this visit.     Allergies as of 08/27/2019 - Review Complete 08/27/2019  Allergen Reaction Noted   Bee venom Nausea And Vomiting 03/27/2014    ROS:  General: Negative for anorexia, unintentional weight loss, fever, chills, fatigue, weakness. ENT: Negative for hoarseness, difficulty swallowing , nasal congestion. CV: Negative for chest pain, angina, palpitations, dyspnea on exertion, peripheral edema.  Respiratory: Negative for dyspnea at rest, dyspnea on exertion, cough, sputum, wheezing.  GI: See history of present illness. GU:  Negative for dysuria, hematuria, urinary incontinence, urinary frequency, nocturnal urination.  Endo: Negative for unusual weight change.    Physical Examination:   BP (!) 144/96    Pulse 85    Temp (!) 97.3 F (36.3 C) (Oral)    Ht 6' (1.829 m)    Wt 266 lb 9.6 oz (120.9 kg)    BMI 36.16 kg/m   General: Well-nourished, well-developed in no acute distress.  Eyes: No icterus. Mouth: Oropharyngeal mucosa moist and pink , no lesions erythema or exudate. Lungs: Clear to auscultation bilaterally.  Heart: Regular rate and rhythm, no murmurs rubs or gallops.  Abdomen: Bowel sounds are normal, nontender, nondistended, no hepatosplenomegaly or masses, no abdominal bruits or hernia , no rebound or guarding.   Extremities: No lower extremity edema. No clubbing or deformities. Neuro: Alert and oriented x 4   Skin: Warm and dry, no jaundice.   Psych: Alert and cooperative, normal mood and affect.  Labs:  Lab Results  Component Value Date   CREATININE  0.95 08/09/2019   BUN 8 08/09/2019   NA 139 08/09/2019   K 4.1 08/09/2019   CL 102 08/09/2019   CO2 27 08/09/2019   Lab Results  Component Value Date   ALT 20 08/07/2019   AST 13 (L) 08/07/2019   ALKPHOS 79 08/07/2019   BILITOT 0.5 08/07/2019   Lab Results    Component Value Date   WBC 9.2 08/08/2019   HGB 17.2 (H) 08/08/2019   HCT 54.4 (H) 08/08/2019   MCV 95.9 08/08/2019   PLT 317 08/08/2019    Imaging Studies: Ct Abdomen Pelvis W Contrast  Result Date: 08/07/2019 CLINICAL DATA:  Abdominal pain, history of recent diverticulitis. EXAM: CT ABDOMEN AND PELVIS WITH CONTRAST TECHNIQUE: Multidetector CT imaging of the abdomen and pelvis was performed using the standard protocol following bolus administration of intravenous contrast. CONTRAST:  144mL OMNIPAQUE IOHEXOL 300 MG/ML  SOLN COMPARISON:  07/31/2019 FINDINGS: Lower chest: No acute abnormality. Hepatobiliary: Fatty infiltration of the liver is noted. The gallbladder is within normal limits. Pancreas: Unremarkable. No pancreatic ductal dilatation or surrounding inflammatory changes. Spleen: Normal in size without focal abnormality. Adrenals/Urinary Tract: Adrenal glands are within normal limits. Kidneys demonstrate normal excretion bilaterally. No renal mass lesion is seen. No obstructive changes are noted. The bladder is partially distended. Stomach/Bowel: There again changes consistent with sigmoid diverticulitis. Slight increase in pericolonic inflammatory change is noted. No evidence of perforation or focal abscess formation is noted at this time. Persistent wall thickening is seen. The more proximal colon demonstrates diverticular change without focal abnormality. The appendix is well visualized with inspissated barium. Small bowel is within normal limits. The stomach is decompressed. Vascular/Lymphatic: No significant vascular findings are present. No enlarged abdominal or pelvic lymph nodes. Reproductive: Prostate is unremarkable. Other: No abdominal wall hernia or abnormality. No abdominopelvic ascites. Musculoskeletal: No acute or significant osseous findings. IMPRESSION: Persistent diverticulitis. The degree of pericolonic inflammatory change has increased somewhat in the interval from the prior  exam although no drainable collection or perforation is identified at this time. No other focal abnormality is noted. Electronically Signed   By: Inez Catalina M.D.   On: 08/07/2019 09:45   Ct Abdomen Pelvis W Contrast  Result Date: 07/31/2019 CLINICAL DATA:  Lower abdominal pain x5 days EXAM: CT ABDOMEN AND PELVIS WITH CONTRAST TECHNIQUE: Multidetector CT imaging of the abdomen and pelvis was performed using the standard protocol following bolus administration of intravenous contrast. CONTRAST:  179mL OMNIPAQUE IOHEXOL 300 MG/ML  SOLN COMPARISON:  08/12/2018 FINDINGS: Lower chest: Lung bases are essentially clear. Hepatobiliary: Hepatic steatosis with focal fatty sparing. Gallbladder is unremarkable. No intrahepatic or extrahepatic ductal dilatation. Pancreas: Within normal limits. Spleen: Within normal limits. Adrenals/Urinary Tract: Adrenal glands are within normal limits. Kidneys are within normal limits.  No hydronephrosis. Bladder is underdistended but unremarkable. Stomach/Bowel: Stomach is within normal limits. No evidence of bowel obstruction. Normal appendix (series 2/image 78). Sigmoid diverticulosis with wall thickening/inflammatory changes along the proximal sigmoid colon in the anterior mid abdomen (coronal image 43), suggesting sigmoid diverticulitis. No drainable fluid collection/abscess. No free air to suggest macroscopic perforation. Vascular/Lymphatic: No evidence of abdominal aortic aneurysm. No suspicious abdominopelvic lymphadenopathy. Reproductive: Prostate is unremarkable. Other: No abdominopelvic ascites. Musculoskeletal: Visualized osseous structures are within normal limits. IMPRESSION: Acute sigmoid diverticulitis. No drainable fluid collection/abscess. No free air. Electronically Signed   By: Julian Hy M.D.   On: 07/31/2019 11:41

## 2019-09-05 ENCOUNTER — Ambulatory Visit: Payer: 59 | Admitting: Nurse Practitioner

## 2019-09-06 ENCOUNTER — Encounter: Payer: Self-pay | Admitting: General Surgery

## 2019-09-06 ENCOUNTER — Ambulatory Visit: Payer: 59 | Admitting: General Surgery

## 2019-09-06 ENCOUNTER — Other Ambulatory Visit: Payer: Self-pay

## 2019-09-06 VITALS — BP 139/88 | HR 101 | Temp 98.4°F | Resp 16 | Ht 72.0 in | Wt 267.8 lb

## 2019-09-06 DIAGNOSIS — K5792 Diverticulitis of intestine, part unspecified, without perforation or abscess without bleeding: Secondary | ICD-10-CM | POA: Diagnosis not present

## 2019-09-06 MED ORDER — NEOMYCIN SULFATE 500 MG PO TABS: 1000.0000 mg | ORAL_TABLET | ORAL | 0 refills | Status: DC

## 2019-09-06 MED ORDER — METRONIDAZOLE 500 MG PO TABS: 1000.0000 mg | ORAL_TABLET | ORAL | 0 refills | Status: DC

## 2019-09-06 NOTE — Progress Notes (Signed)
[facility] History and Physical  Reason for Referral: Diverticulitis  Referring Physician:  Neil Crouch PA (GI)   Chief Complaint    Follow-up      Juan Lopez is a 38 y.o. male.  HPI: Juan Lopez is a 38 yo with a history of acute myopericarditis thought to be related to a vaccination per his report that required a heart catheterization due to elevated troponins and fear of him having an MI, but he was found to have normal vessels on exam.  He has a history of longstanding diverticulitis with repeated episodes each year for the past 5 years. He says he has been hospitalized for a few requiring IV antibiotics for 24-48 hrs, and ultimately being released on an oral regimen. He reports no complications such as abscess or perforation. He had a colonoscopy in 2019 with Dr. Oneida Alar that demonstrated diverticula and a small polyp.   When he has his episodes he gets LLQ pain, and recently was in the hospital with this pain and discharged on antibiotics. He recovered from this but then after Thanksgiving started having more pain and has been taking cipro and flagyl that he had at home.  He says that he is now feeling better. He has daily loose stools, and when he gets a flare he becomes constipated. He says that this is when he knows he is really having a flare.  He has been eating and drinking normally.   Past Medical History:  Diagnosis Date  . Acute myopericarditis    a. dx 10/2016  . CAD (coronary artery disease)    a. LHC 10/2016 with mild luminal irregularities. Placed on statin   . Diverticulitis   . Family history of early CAD   . HTN (hypertension)     Past Surgical History:  Procedure Laterality Date  . CARDIAC CATHETERIZATION [name] 10/16/2016   Procedure: Left Heart Cath and Coronary Angiography;  Surgeon: Peter M Martinique, MD;  Location: [facility];  Service: Cardiovascular;  Laterality: [name];  . COLONOSCOPY [name] 11/14/2017   Dr. Oneida Alar: 3 mm polyp from  proximal ascending colon removed, rectosigmoid colon and sigmoid colon diverticula, external and internal hemorrhoids, random colon biopsies were benign.  Marland Kitchen POLYPECTOMY  11/14/2017   Procedure: POLYPECTOMY;  Surgeon: Danie Binder, MD;  Location: [facility];  Service: Endoscopy;;  Ascending colon (CS)  . TONSILLECTOMY    . WISDOM TOOTH EXTRACTION     AGE 89-25    Family History  Problem Relation Age of Onset  . Diabetes Mother   . Heart failure Mother   . Colon polyps Mother        REQUIRED COLECTOMY  . Heart failure Father   . Heart attack Maternal Grandmother   . Pneumonia Maternal Grandfather   . Diabetes Paternal Grandmother   . Obesity Paternal Grandmother   . Heart attack Paternal Grandfather   . Colon cancer Neg Hx     Social History   Tobacco Use  . Smoking status: Current Some Day Smoker    Packs/day: 0.50  . Smokeless tobacco: Never Used  . Tobacco comment: Only smokes when at work; doesn't smoke at home around children  Substance Use Topics  . Alcohol use: Not Currently    Comment: rare  . Drug use: No    Medications: I have reviewed the patient's current medications. Allergies as of 09/06/2019      Reactions   Bee Venom Nausea And Vomiting  Medication List       Accurate as of September 06, 2019  3:48 PM. If you have any questions, ask your nurse or doctor.        acetaminophen 325 MG tablet Commonly known as: TYLENOL Take 325-650 mg by mouth every 6 (six) hours as needed for moderate pain. For pain   nitroGLYCERIN 0.4 MG SL tablet Commonly known as: NITROSTAT Place 1 tablet (0.4 mg total) under the tongue every 5 (five) minutes x 3 doses as needed for chest pain.     He denies having an Rx for or ever needing or using Nitroglycerin.    ROS:  Denies any chest pain or SOB. A comprehensive review of systems was negative except for: Gastrointestinal: positive for diarrhea  Blood pressure 139/88, pulse (!) 101, temperature 98.4 F (36.9  C), temperature source Temporal, resp. rate 16, height 6' (1.829 m), weight 267 lb 12.8 oz (121.5 kg), SpO2 97 %. Physical Exam  Results: CT a/p 08/2019  Personally reviewed imaging, thickened sigmoid colon with inflammation, no signs of perforation  IMPRESSION: Persistent diverticulitis. The degree of pericolonic inflammatory change has increased somewhat in the interval from the prior exam although no drainable collection or perforation is identified at this time.  No other focal abnormality is noted.   Assessment & Plan:  Juan Lopez is a 38 y.o. male with continuous recurrent sigmoid diverticulitis. He has been having multiple episodes a year and had his most recent flare just a few weeks ago after Thanksgiving. He is completing a course of antibiotics, and this is likely due to having an episode in early November that had not fully resolved. He is currently not having any pain or issues, but wants to go ahead and getting this taken care of given the recurrence frequency and pain associated with the episodes.   -Laparoscopic partial colectomy   -We discussed the risk of bleeding, infection, anastomosis, anastomotic leak, need for colostomy, injury to other organs and ureter, and need for a bowel prep. We discussed COVID testing preop and isolation after testing. We discussed the potential for the case to get canceled if COVID numbers continue to spike. Given his continued issue with a diverticulitis flare and need for continued antibiotics, I discussed the case with Dr. Arnoldo Morale. He agrees that it is appropriate to schedule the patient as soon as possible to prevent a recurrence and additional hospitalization.   Buy from the Store: Miralax bottle [phone]).  Gatorade 64 oz (not red). Dulcolax tablets.   The Day Prior to Surgery: Take 4 ducolax tablets at 7am with water. Drink plenty of clear liquids all day to avoid dehydration, no solid food.    Mix the bottle of Miralax and  64 oz of Gatorade and drink this mixture starting at 10am. Drink it gradually over the next few hours, 8 ounces every 15-30 minutes until it is gone. Finish this by 2pm.  Take 2 neomycin 500mg  tablets and 2 metronidazole 500mg  tablets at 2 pm. Take 2 neomycin 500mg  tablets and 2 metronidazole 500mg  tablets at 3pm. Take 2 neomycin 500mg  tablets and 2 metronidazole 500mg  tablets at 10pm.    Do not eat or drink anything after midnight the night before your surgery.  Do not eat or drink anything that morning, and take medications as instructed by the hospital staff on your preoperative visit.   All questions were answered to the satisfaction of the patient.   Virl Cagey 09/06/2019, 3:48 PM

## 2019-09-06 NOTE — Patient Instructions (Addendum)
October 08, 2019 for surgery. The office will call you next week with the information for your preoperative testing and COVID testing.  Colon Preparation: Buy from the Store: Miralax bottle [phone]).  Gatorade 64 oz (not red). Dulcolax tablets.   The Day Prior to Surgery: Take 4 ducolax tablets at 7am with water. Drink plenty of clear liquids all day to avoid dehydration, no solid food.    Mix the bottle of Miralax and 64 oz of Gatorade and drink this mixture starting at 10am. Drink it gradually over the next few hours, 8 ounces every 15-30 minutes until it is gone. Finish this by 2pm.  Take 2 neomycin 500mg  tablets and 2 metronidazole 500mg  tablets at 2 pm. Take 2 neomycin 500mg  tablets and 2 metronidazole 500mg  tablets at 3pm. Take 2 neomycin 500mg  tablets and 2 metronidazole 500mg  tablets at 10pm.    Do not eat or drink anything after midnight the night before your surgery.  Do not eat or drink anything that morning, and take medications as instructed by the hospital staff on your preoperative visit.     Laparoscopic Partial Colectomy Laparoscopic colectomy is surgery to remove part or all of the large intestine (colon). This procedure may be used to treat several conditions, including:  Inflammation and infection of the colon (diverticulitis).  Tumors or masses in the colon.  Inflammatory bowel disease, such as Crohn disease or ulcerative colitis. Colectomy is an option when symptoms cannot be controlled with medicines.  Bleeding from the colon that cannot be controlled by another method.  Blockage or obstruction of the colon. Tell a health care provider about:  Any allergies you have.  All medicines you are taking, including vitamins, herbs, eye drops, creams, and over-the-counter medicines.  Any problems you or family members have had with anesthetic medicines.  Any blood disorders you have.  Any surgeries you have had.  Any medical conditions you have. What are the  risks? Generally, this is a safe procedure. However, problems may occur, including:  Infection.  Bleeding.  Allergic reactions to medicines or dyes.  Damage to other structures or organs.  Leaking from where the colon was sewn together.  Future blockage of the small intestines from scar tissue. Another surgery may be needed to repair this.  Needing to convert to an open procedure. Complications such as damage to other organs or excessive bleeding may require the surgeon to convert from a laparoscopic procedure to an open procedure. This involves making a larger incision in the abdomen. Medicines  Ask your health care provider about: ? Changing or stopping your regular medicines. This is especially important if you are taking diabetes medicines or blood thinners. ? Taking medicines such as aspirin and ibuprofen. These medicines can thin your blood. Do not take these medicines before your procedure if your health care provider instructs you not to.  You may be given antibiotic medicine to clean out bacteria from your colon. Follow the directions carefully and take the medicine at the correct time. General instructions  You may be prescribed an oral bowel prep to clean out your colon in preparation for the surgery: ? Follow instructions from your health care provider about how to do this. ? Do not eat or drink anything else after you have started the bowel prep, unless your health care provider tells you it is safe to do so.  Do not use any products that contain nicotine or tobacco, such as cigarettes and e-cigarettes. If you need help quitting, ask your  health care provider. What happens during the procedure?  To reduce your risk of infection: ? Your health care team will wash or sanitize their hands. ? Your skin will be washed with soap.  An IV tube will be inserted into one of your veins to deliver fluid and medication.  You will be given one of the following: ? A medicine to  help you relax (sedative). ? A medicine to make you fall asleep (general anesthetic).  Small monitors will be connected to your body. They will be used to check your heart, blood pressure, and oxygen level.  A breathing tube may be placed into your lungs during the procedure.  A thin, flexible tube (catheter) will be placed into your bladder to drain urine.  A tube may be placed through your nose and into your stomach to drain stomach fluids (nasogastric tube, or NG tube).  Your abdomen will be filled with air so it expands. This gives the surgeon more room to operate and makes your organs easier to see.  Several small cuts (incisions) will be made in your abdomen.  A thin, lighted tube with a tiny camera on the end (laparoscope) will be put through one of the small incisions. The camera on the laparoscope will send a picture to a computer screen in the operating room. This will give the surgeon a good view inside your abdomen.  Hollow tubes will be put through the other small incisions in your abdomen. The tools that are needed for the procedure will be put through these tubes.  Clamps or staples will be put on both ends of the diseased part of the colon.  The part of the intestine between the clamps or staples will be removed.  If possible, the ends of the healthy colon that remain will be stitched (sutured) or stapled together to allow your body to pass waste (stool).  Sometimes, the remaining colon cannot be stitched back together. If this is the case, a colostomy will be needed. If you need a colostomy: ? An opening to the outside of your body (stoma) will be made through your abdomen. ? The end of your colon will be brought to the opening. It will be stitched to the skin. ? A bag will be attached to the opening. Stool will drain into this removable bag. ? The colostomy may be temporary or permanent.  The incisions from the colectomy will be closed with sutures or staples. The  procedure may vary among health care providers and hospitals. What happens after the procedure?  Your blood pressure, heart rate, breathing rate, and blood oxygen level will be monitored until the medicines you were given have worn off.  You will receive fluids through an IV tube until your bowels start to work properly.  Once your bowels are working again, you will be given clear liquids first and then solid food as tolerated.  You will be given medicines to control your pain and nausea, if needed.  Do not drive for 24 hours if you were given a sedative. This information is not intended to replace advice given to you by your health care provider. Make sure you discuss any questions you have with your health care provider. Document Released: 12/11/2002 Document Revised: 09/02/2017 Document Reviewed: 06/21/2016 Elsevier Patient Education  2020 [employer].

## 2019-09-12 NOTE — H&P (Signed)
[facility] History and Physical  Reason for Referral: Diverticulitis  Referring Physician:  Neil Crouch PA (GI)      Chief Complaint    Follow-up      Juan Lopez is a 38 y.o. male.  HPI: Juan Lopez is a 38 yo with a history of acute myopericarditis thought to be related to a vaccination per his report that required a heart catheterization due to elevated troponins and fear of him having an MI, but he was found to have normal vessels on exam.  He has a history of longstanding diverticulitis with repeated episodes each year for the past 5 years. He says he has been hospitalized for a few requiring IV antibiotics for 24-48 hrs, and ultimately being released on an oral regimen. He reports no complications such as abscess or perforation. He had a colonoscopy in 2019 with Dr. Oneida Alar that demonstrated diverticula and a small polyp.   When he has his episodes he gets LLQ pain, and recently was in the hospital with this pain and discharged on antibiotics. He recovered from this but then after Thanksgiving started having more pain and has been taking cipro and flagyl that he had at home.  He says that he is now feeling better. He has daily loose stools, and when he gets a flare he becomes constipated. He says that this is when he knows he is really having a flare.  He has been eating and drinking normally.       Past Medical History:  Diagnosis Date  . Acute myopericarditis    a. dx 10/2016  . CAD (coronary artery disease)    a. LHC 10/2016 with mild luminal irregularities. Placed on statin   . Diverticulitis   . Family history of early CAD   . HTN (hypertension)          Past Surgical History:  Procedure Laterality Date  . CARDIAC CATHETERIZATION [name] 10/16/2016   Procedure: Left Heart Cath and Coronary Angiography;  Surgeon: Peter M Martinique, MD;  Location: [place] [facility];  Service: Cardiovascular;  Laterality: [name];  . COLONOSCOPY [name] 11/14/2017    Dr. Oneida Alar: 3 mm polyp from proximal ascending colon removed, rectosigmoid colon and sigmoid colon diverticula, external and internal hemorrhoids, random colon biopsies were benign.  Marland Kitchen POLYPECTOMY  11/14/2017   Procedure: POLYPECTOMY;  Surgeon: Danie Binder, MD;  Location: [facility];  Service: Endoscopy;;  Ascending colon (CS)  . TONSILLECTOMY    . WISDOM TOOTH EXTRACTION     AGE 25-25    Family History  Problem Relation Age of Onset  . Diabetes Mother   . Heart failure Mother   . Colon polyps Mother        REQUIRED COLECTOMY  . Heart failure Father   . Heart attack Maternal Grandmother   . Pneumonia Maternal Grandfather   . Diabetes Paternal Grandmother   . Obesity Paternal Grandmother   . Heart attack Paternal Grandfather   . Colon cancer Neg Hx     Social History        Tobacco Use  . Smoking status: Current Some Day Smoker    Packs/day: 0.50  . Smokeless tobacco: Never Used  . Tobacco comment: Only smokes when at work; doesn't smoke at home around children  Substance Use Topics  . Alcohol use: Not Currently    Comment: rare  . Drug use: No    Medications: I have reviewed the patient's current medications.  Allergies as of 09/06/2019      Reactions   Bee Venom Nausea And Vomiting         Medication List       Accurate as of September 06, 2019  3:48 PM. If you have any questions, ask your nurse or doctor.        acetaminophen 325 MG tablet Commonly known as: TYLENOL Take 325-650 mg by mouth every 6 (six) hours as needed for moderate pain. For pain   nitroGLYCERIN 0.4 MG SL tablet Commonly known as: NITROSTAT Place 1 tablet (0.4 mg total) under the tongue every 5 (five) minutes x 3 doses as needed for chest pain.     He denies having an Rx for or ever needing or using Nitroglycerin.    ROS:  Denies any chest pain or SOB. A comprehensive review of systems was negative except for: Gastrointestinal:  positive for diarrhea  Blood pressure 139/88, pulse (!) 101, temperature 98.4 F (36.9 C), temperature source Temporal, resp. rate 16, height 6' (1.829 m), weight 267 lb 12.8 oz (121.5 kg), SpO2 97 %. Physical Exam  Results: CT a/p 08/2019  Personally reviewed imaging, thickened sigmoid colon with inflammation, no signs of perforation  IMPRESSION: Persistent diverticulitis. The degree of pericolonic inflammatory change has increased somewhat in the interval from the prior exam although no drainable collection or perforation is identified at this time.  No other focal abnormality is noted.   Assessment & Plan:  Juan Lopez is a 38 y.o. male with continuous recurrent sigmoid diverticulitis. He has been having multiple episodes a year and had his most recent flare just a few weeks ago after Thanksgiving. He is completing a course of antibiotics, and this is likely due to having an episode in early November that had not fully resolved. He is currently not having any pain or issues, but wants to go ahead and getting this taken care of given the recurrence frequency and pain associated with the episodes.   -Laparoscopic partial colectomy   -We discussed the risk of bleeding, infection, anastomosis, anastomotic leak, need for colostomy, injury to other organs and ureter, and need for a bowel prep. We discussed COVID testing preop and isolation after testing. We discussed the potential for the case to get canceled if COVID numbers continue to spike. Given his continued issue with a diverticulitis flare and need for continued antibiotics, I discussed the case with Dr. Arnoldo Morale. He agrees that it is appropriate to schedule the patient as soon as possible to prevent a recurrence and additional hospitalization.   Buy from the Store: Miralax bottle [phone]).  Gatorade 64 oz (not red). Dulcolax tablets.   The Day Prior to Surgery: Take 4 ducolax tablets at 7am with water. Drink plenty  of clear liquids all day to avoid dehydration, no solid food.   Mix thebottle of Miralax and 64 oz of Gatorade and drink this mixture starting at 10am. Drinkit gradually over the next few hours,8 ounces every 15-30 minutes until it is gone. Finish this by 2pm.  Take 2 neomycin 500mg  tablets and 2 metronidazole 500mg  tablets at 2 pm. Take 2 neomycin 500mg  tablets and 2 metronidazole 500mg  tablets at 3pm. Take 2 neomycin 500mg  tablets and 2 metronidazole 500mg  tablets at 10pm.   Do not eat or drink anything after midnight the night before your surgery.  Do not eat or drink anything that morning, and take medications as instructed by the hospital staff on your preoperative visit.  All questions were  answered to the satisfaction of the patient.   Virl Cagey 09/06/2019, 3:48 PM

## 2019-09-21 NOTE — Patient Instructions (Signed)
Juan Lopez  09/21/2019     @PREFPERIOPPHARMACY @   Your procedure is scheduled on   10/01/2019  Report to [facility] at  [place].M.  Call this number if you have problems the morning of surgery:  [phone]   Remember:  Follow the diet and prep instructions given to you by Dr Constance Haw.                      Take these medicines the morning of surgery with A SIP OF WATER  Prozac.    Do not wear jewelry, make-up or nail polish.  Do not wear lotions, powders, or perfumes. Please wear deodorant and brush your teeth.  Do not shave 48 hours prior to surgery.  Men may shave face and neck.  Do not bring valuables to the hospital.  [facility] is not responsible for any belongings or valuables.  Contacts, dentures or bridgework may not be worn into surgery.  Leave your suitcase in the car.  After surgery it may be brought to your room.  For patients admitted to the hospital, discharge time will be determined by your treatment team.  Patients discharged the day of surgery will not be allowed to drive home.   Name and phone number of your driver:   family Special instructions:  None  Please read over the following fact sheets that you were given. Anesthesia Post-op Instructions and Care and Recovery After Surgery       Laparoscopic Colectomy, Care After This sheet gives you information about how to care for yourself after your procedure. Your health care provider may also give you more specific instructions. If you have problems or questions, contact your health care provider. What can I expect after the procedure? After your procedure, it is common to have the following:  Pain in your abdomen, especially in the incision areas. You will be given medicine to control the pain.  Tiredness. This is a normal part of the recovery process. Your energy level will return to normal over the next several weeks.  Changes in your bowel movements, such as constipation or  needing to go more often. Talk with your health care provider about how to manage this. Follow these instructions at home: Medicines  Take over-the-counter and prescription medicines only as told by your health care provider.  Do not drive or use heavy machinery while taking prescription pain medicine.  Do not drink alcohol while taking prescription pain medicine.  If you were prescribed an antibiotic medicine, use it as told by your health care provider. Do not stop using the antibiotic even if you start to feel better. Incision care   Follow instructions from your health care provider about how to take care of your incision areas. Make sure you: ? Keep your incisions clean and dry. ? Wash your hands with soap and water before and after applying medicine to the areas, and before and after changing your bandage (dressing). If soap and water are not available, use hand sanitizer. ? Change your dressing as told by your health care provider. ? Leave stitches (sutures), skin glue, or adhesive strips in place. These skin closures may need to stay in place for 2 weeks or longer. If adhesive strip edges start to loosen and curl up, you may trim the loose edges. Do not remove adhesive strips completely unless your health care provider tells you to do that.  Do  not wear tight clothing over the incisions. Tight clothing may rub and irritate the incision areas, which may cause the incisions to open.  Do not take baths, swim, or use a hot tub until your health care provider approves. Ask your health care provider if you can take showers. You may only be allowed to take sponge baths for bathing.  Check your incision area every day for signs of infection. Check for: ? More redness, swelling, or pain. ? More fluid or blood. ? Warmth. ? Pus or a bad smell. Activity  Avoid lifting anything that is heavier than 10 lb (4.5 kg) for 2 weeks or until your health care provider says it is okay.  You may  resume normal activities as told by your health care provider. Ask your health care provider what activities are safe for you.  Take rest breaks during the day as needed. Eating and drinking  Follow instructions from your health care provider about what you can eat after surgery.  To prevent or treat constipation while you are taking prescription pain medicine, your health care provider may recommend that you: ? Drink enough fluid to keep your urine clear or pale yellow. ? Take over-the-counter or prescription medicines. ? Eat foods that are high in fiber, such as fresh fruits and vegetables, whole grains, and beans. ? Limit foods that are high in fat and processed sugars, such as fried and sweet foods. General instructions  Ask your health care provider when you will need an appointment to get your sutures or staples removed.  Keep all follow-up visits as told by your health care provider. This is important. Contact a health care provider if:  You have more redness, swelling, or pain around your incisions.  You have more fluid or blood coming from the incisions.  Your incisions feel warm to the touch.  You have pus or a bad smell coming from your incisions or your dressing.  You have a fever.  You have an incision that breaks open (edges not staying together) after sutures or staples have been removed. Get help right away if:  You develop a rash.  You have chest pain or difficulty breathing.  You have pain or swelling in your legs.  You feel light-headed or you faint.  Your abdomen swells (becomes distended).  You have nausea or vomiting.  You have blood in your stool (feces). This information is not intended to replace advice given to you by your health care provider. Make sure you discuss any questions you have with your health care provider. Document Released: 04/09/2005 Document Revised: 06/09/2018 Document Reviewed: 06/21/2016 Elsevier Patient Education  2020  [place] Anesthesia, Adult, Care After This sheet gives you information about how to care for yourself after your procedure. Your health care provider may also give you more specific instructions. If you have problems or questions, contact your health care provider. What can I expect after the procedure? After the procedure, the following side effects are common:  Pain or discomfort at the IV site.  Nausea.  Vomiting.  Sore throat.  Trouble concentrating.  Feeling cold or chills.  Weak or tired.  Sleepiness and fatigue.  Soreness and body aches. These side effects can affect parts of the body that were not involved in surgery. Follow these instructions at home:  For at least 24 hours after the procedure:  Have a responsible adult stay with you. It is important to have someone help care for you until you are  awake and alert.  Rest as needed.  Do not: ? Participate in activities in which you could fall or become injured. ? Drive. ? Use heavy machinery. ? Drink alcohol. ? Take sleeping pills or medicines that cause drowsiness. ? Make important decisions or sign legal documents. ? Take care of children on your own. Eating and drinking  Follow any instructions from your health care provider about eating or drinking restrictions.  When you feel hungry, start by eating small amounts of foods that are soft and easy to digest (bland), such as toast. Gradually return to your regular diet.  Drink enough fluid to keep your urine pale yellow.  If you vomit, rehydrate by drinking water, juice, or clear broth. General instructions  If you have sleep apnea, surgery and certain medicines can increase your risk for breathing problems. Follow instructions from your health care provider about wearing your sleep device: ? Anytime you are sleeping, including during daytime naps. ? While taking prescription pain medicines, sleeping medicines, or medicines that make you  drowsy.  Return to your normal activities as told by your health care provider. Ask your health care provider what activities are safe for you.  Take over-the-counter and prescription medicines only as told by your health care provider.  If you smoke, do not smoke without supervision.  Keep all follow-up visits as told by your health care provider. This is important. Contact a health care provider if:  You have nausea or vomiting that does not get better with medicine.  You cannot eat or drink without vomiting.  You have pain that does not get better with medicine.  You are unable to pass urine.  You develop a skin rash.  You have a fever.  You have redness around your IV site that gets worse. Get help right away if:  You have difficulty breathing.  You have chest pain.  You have blood in your urine or stool, or you vomit blood. Summary  After the procedure, it is common to have a sore throat or nausea. It is also common to feel tired.  Have a responsible adult stay with you for the first 24 hours after general anesthesia. It is important to have someone help care for you until you are awake and alert.  When you feel hungry, start by eating small amounts of foods that are soft and easy to digest (bland), such as toast. Gradually return to your regular diet.  Drink enough fluid to keep your urine pale yellow.  Return to your normal activities as told by your health care provider. Ask your health care provider what activities are safe for you. This information is not intended to replace advice given to you by your health care provider. Make sure you discuss any questions you have with your health care provider. Document Released: 12/27/2000 Document Revised: 09/23/2017 Document Reviewed: 05/06/2017 Elsevier Patient Education  2020 [employer]. How to Use Chlorhexidine for Bathing Chlorhexidine gluconate (CHG) is a germ-killing (antiseptic) solution that is used to clean  the skin. It can get rid of the bacteria that normally live on the skin and can keep them away for about 24 hours. To clean your skin with CHG, you may be given:  A CHG solution to use in the shower or as part of a sponge bath.  A prepackaged cloth that contains CHG. Cleaning your skin with CHG may help lower the risk for infection:  While you are staying in the intensive care unit of the hospital.  If you have a vascular access, such as a central line, to provide short-term or long-term access to your veins.  If you have a catheter to drain urine from your bladder.  If you are on a ventilator. A ventilator is a machine that helps you breathe by moving air in and out of your lungs.  After surgery. What are the risks? Risks of using CHG include:  A skin reaction.  Hearing loss, if CHG gets in your ears.  Eye injury, if CHG gets in your eyes and is not rinsed out.  The CHG product catching fire. Make sure that you avoid smoking and flames after applying CHG to your skin. Do not use CHG:  If you have a chlorhexidine allergy or have previously reacted to chlorhexidine.  On babies younger than 17 months of age. How to use CHG solution  Use CHG only as told by your health care provider, and follow the instructions on the label.  Use the full amount of CHG as directed. Usually, this is one bottle. During a shower Follow these steps when using CHG solution during a shower (unless your health care provider gives you different instructions): 1. Start the shower. 2. Use your normal soap and shampoo to wash your face and hair. 3. Turn off the shower or move out of the shower stream. 4. Pour the CHG onto a clean washcloth. Do not use any type of brush or rough-edged sponge. 5. Starting at your neck, lather your body down to your toes. Make sure you follow these instructions: ? If you will be having surgery, pay special attention to the part of your body where you will be having surgery.  Scrub this area for at least 1 minute. ? Do not use CHG on your head or face. If the solution gets into your ears or eyes, rinse them well with water. ? Avoid your genital area. ? Avoid any areas of skin that have broken skin, cuts, or scrapes. ? Scrub your back and under your arms. Make sure to wash skin folds. 6. Let the lather sit on your skin for 1-2 minutes or as long as told by your health care provider. 7. Thoroughly rinse your entire body in the shower. Make sure that all body creases and crevices are rinsed well. 8. Dry off with a clean towel. Do not put any substances on your body afterward--such as powder, lotion, or perfume--unless you are told to do so by your health care provider. Only use lotions that are recommended by the manufacturer. 9. Put on clean clothes or pajamas. 10. If it is the night before your surgery, sleep in clean sheets.  During a sponge bath Follow these steps when using CHG solution during a sponge bath (unless your health care provider gives you different instructions): 1. Use your normal soap and shampoo to wash your face and hair. 2. Pour the CHG onto a clean washcloth. 3. Starting at your neck, lather your body down to your toes. Make sure you follow these instructions: ? If you will be having surgery, pay special attention to the part of your body where you will be having surgery. Scrub this area for at least 1 minute. ? Do not use CHG on your head or face. If the solution gets into your ears or eyes, rinse them well with water. ? Avoid your genital area. ? Avoid any areas of skin that have broken skin, cuts, or scrapes. ? Scrub your back and under your arms. Make  sure to wash skin folds. 4. Let the lather sit on your skin for 1-2 minutes or as long as told by your health care provider. 5. Using a different clean, wet washcloth, thoroughly rinse your entire body. Make sure that all body creases and crevices are rinsed well. 6. Dry off with a clean towel.  Do not put any substances on your body afterward--such as powder, lotion, or perfume--unless you are told to do so by your health care provider. Only use lotions that are recommended by the manufacturer. 7. Put on clean clothes or pajamas. 8. If it is the night before your surgery, sleep in clean sheets. How to use CHG prepackaged cloths  Only use CHG cloths as told by your health care provider, and follow the instructions on the label.  Use the CHG cloth on clean, dry skin.  Do not use the CHG cloth on your head or face unless your health care provider tells you to.  When washing with the CHG cloth: ? Avoid your genital area. ? Avoid any areas of skin that have broken skin, cuts, or scrapes. Before surgery Follow these steps when using a CHG cloth to clean before surgery (unless your health care provider gives you different instructions): 1. Using the CHG cloth, vigorously scrub the part of your body where you will be having surgery. Scrub using a back-and-forth motion for 3 minutes. The area on your body should be completely wet with CHG when you are done scrubbing. 2. Do not rinse. Discard the cloth and let the area air-dry. Do not put any substances on the area afterward, such as powder, lotion, or perfume. 3. Put on clean clothes or pajamas. 4. If it is the night before your surgery, sleep in clean sheets.  For general bathing Follow these steps when using CHG cloths for general bathing (unless your health care provider gives you different instructions). 1. Use a separate CHG cloth for each area of your body. Make sure you wash between any folds of skin and between your fingers and toes. Wash your body in the following order, switching to a new cloth after each step: ? The front of your neck, shoulders, and chest. ? Both of your arms, under your arms, and your hands. ? Your stomach and groin area, avoiding the genitals. ? Your right leg and foot. ? Your left leg and foot. ? The back  of your neck, your back, and your buttocks. 2. Do not rinse. Discard the cloth and let the area air-dry. Do not put any substances on your body afterward--such as powder, lotion, or perfume--unless you are told to do so by your health care provider. Only use lotions that are recommended by the manufacturer. 3. Put on clean clothes or pajamas. Contact a health care provider if:  Your skin gets irritated after scrubbing.  You have questions about using your solution or cloth. Get help right away if:  Your eyes become very red or swollen.  Your eyes itch badly.  Your skin itches badly and is red or swollen.  Your hearing changes.  You have trouble seeing.  You have swelling or tingling in your mouth or throat.  You have trouble breathing.  You swallow any chlorhexidine. Summary  Chlorhexidine gluconate (CHG) is a germ-killing (antiseptic) solution that is used to clean the skin. Cleaning your skin with CHG may help to lower your risk for infection.  You may be given CHG to use for bathing. It may be in a bottle  or in a prepackaged cloth to use on your skin. Carefully follow your health care provider's instructions and the instructions on the product label.  Do not use CHG if you have a chlorhexidine allergy.  Contact your health care provider if your skin gets irritated after scrubbing. This information is not intended to replace advice given to you by your health care provider. Make sure you discuss any questions you have with your health care provider. Document Released: 06/14/2012 Document Revised: 12/07/2018 Document Reviewed: 08/18/2017 Elsevier Patient Education  2020 [employer].

## 2019-09-25 ENCOUNTER — Other Ambulatory Visit: Payer: Self-pay

## 2019-09-25 ENCOUNTER — Encounter (HOSPITAL_COMMUNITY): Payer: Self-pay

## 2019-09-25 ENCOUNTER — Encounter (HOSPITAL_COMMUNITY)
Admission: RE | Admit: 2019-09-25 | Discharge: 2019-09-25 | Disposition: A | Discharge status: Home / Self Care | Payer: 59 | Source: Ambulatory Visit | Attending: General Surgery | Admitting: General Surgery

## 2019-09-25 DIAGNOSIS — Z01818 Encounter for other preprocedural examination: Secondary | ICD-10-CM | POA: Insufficient documentation

## 2019-09-25 HISTORY — DX: Anxiety disorder, unspecified: F41.9

## 2019-09-25 LAB — CBC WITH DIFFERENTIAL/PLATELET
Abs Immature Granulocytes: 0.01 10*3/uL (ref 0.00–0.07)
Basophils Absolute: 0 10*3/uL (ref 0.0–0.1)
Basophils Relative: 1 %
Eosinophils Absolute: 0.2 10*3/uL (ref 0.0–0.5)
Eosinophils Relative: 3 %
HCT: 55.7 % — ABNORMAL HIGH (ref 39.0–52.0)
Hemoglobin: 18 g/dL — ABNORMAL HIGH (ref 13.0–17.0)
Immature Granulocytes: 0 %
Lymphocytes Relative: 30 %
Lymphs Abs: 2.4 10*3/uL (ref 0.7–4.0)
MCH: 30.5 pg (ref 26.0–34.0)
MCHC: 32.3 g/dL (ref 30.0–36.0)
MCV: 94.2 fL (ref 80.0–100.0)
Monocytes Absolute: 0.5 10*3/uL (ref 0.1–1.0)
Monocytes Relative: 6 %
Neutro Abs: 4.8 10*3/uL (ref 1.7–7.7)
Neutrophils Relative %: 60 %
Platelets: 286 10*3/uL (ref 150–400)
RBC: 5.91 MIL/uL — ABNORMAL HIGH (ref 4.22–5.81)
RDW: 12.8 % (ref 11.5–15.5)
WBC: 7.9 10*3/uL (ref 4.0–10.5)
nRBC: 0 % (ref 0.0–0.2)

## 2019-09-25 LAB — BASIC METABOLIC PANEL
Anion gap: 11 (ref 5–15)
BUN: 11 mg/dL (ref 6–20)
CO2: 24 mmol/L (ref 22–32)
Calcium: 8.8 mg/dL — ABNORMAL LOW (ref 8.9–10.3)
Chloride: 101 mmol/L (ref 98–111)
Creatinine, Ser: 0.76 mg/dL (ref 0.61–1.24)
GFR calc Af Amer: >60 mL/min (ref >60)
GFR calc non Af Amer: >60 mL/min (ref >60)
Glucose, Bld: 126 mg/dL — ABNORMAL HIGH (ref 70–99)
Potassium: 3.9 mmol/L (ref 3.5–5.1)
Sodium: 136 mmol/L (ref 135–145)

## 2019-09-28 LAB — TYPE AND SCREEN
ABO/RH(D): A POS
Antibody Screen: NEGATIVE

## 2019-09-29 ENCOUNTER — Other Ambulatory Visit (HOSPITAL_COMMUNITY)
Admission: RE | Admit: 2019-09-29 | Discharge: 2019-09-29 | Disposition: A | Discharge status: Home / Self Care | Payer: 59 | Source: Ambulatory Visit | Attending: General Surgery | Admitting: General Surgery

## 2019-09-29 ENCOUNTER — Other Ambulatory Visit: Payer: Self-pay

## 2019-09-29 DIAGNOSIS — Z01812 Encounter for preprocedural laboratory examination: Secondary | ICD-10-CM | POA: Insufficient documentation

## 2019-09-29 DIAGNOSIS — Z20828 Contact with and (suspected) exposure to other viral communicable diseases: Secondary | ICD-10-CM | POA: Insufficient documentation

## 2019-09-29 LAB — SARS CORONAVIRUS 2 (TAT 6-24 HRS): SARS Coronavirus 2: NEGATIVE

## 2019-10-01 ENCOUNTER — Encounter (HOSPITAL_COMMUNITY)
Admission: RE | Disposition: A | Discharge status: Home / Self Care | Payer: Self-pay | Source: Home / Self Care | Attending: General Surgery

## 2019-10-01 ENCOUNTER — Inpatient Hospital Stay (HOSPITAL_COMMUNITY): Payer: 59 | Admitting: Anesthesiology

## 2019-10-01 ENCOUNTER — Inpatient Hospital Stay (HOSPITAL_COMMUNITY)
Admission: RE | Admit: 2019-10-01 | Discharge: 2019-10-04 | DRG: 330 | Disposition: A | Discharge status: Home / Self Care | Payer: 59 | Attending: General Surgery | Admitting: General Surgery

## 2019-10-01 ENCOUNTER — Other Ambulatory Visit: Payer: Self-pay

## 2019-10-01 ENCOUNTER — Encounter (HOSPITAL_COMMUNITY): Payer: Self-pay | Admitting: General Surgery

## 2019-10-01 DIAGNOSIS — Q438 Other specified congenital malformations of intestine: Secondary | ICD-10-CM | POA: Diagnosis not present

## 2019-10-01 DIAGNOSIS — Z833 Family history of diabetes mellitus: Secondary | ICD-10-CM

## 2019-10-01 DIAGNOSIS — Z8371 Family history of colonic polyps: Secondary | ICD-10-CM

## 2019-10-01 DIAGNOSIS — F17200 Nicotine dependence, unspecified, uncomplicated: Secondary | ICD-10-CM | POA: Diagnosis present

## 2019-10-01 DIAGNOSIS — Z8249 Family history of ischemic heart disease and other diseases of the circulatory system: Secondary | ICD-10-CM

## 2019-10-01 DIAGNOSIS — K5732 Diverticulitis of large intestine without perforation or abscess without bleeding: Secondary | ICD-10-CM | POA: Diagnosis present

## 2019-10-01 DIAGNOSIS — Z20828 Contact with and (suspected) exposure to other viral communicable diseases: Secondary | ICD-10-CM | POA: Diagnosis present

## 2019-10-01 DIAGNOSIS — K5792 Diverticulitis of intestine, part unspecified, without perforation or abscess without bleeding: Secondary | ICD-10-CM | POA: Diagnosis not present

## 2019-10-01 HISTORY — PX: LAPAROSCOPIC PARTIAL COLECTOMY: SHX5907

## 2019-10-01 SURGERY — LAPAROSCOPIC PARTIAL COLECTOMY
Anesthesia: General | Site: Abdomen

## 2019-10-01 MED ORDER — ROCURONIUM BROMIDE 10 MG/ML (PF) SYRINGE
PREFILLED_SYRINGE | INTRAVENOUS | Status: AC
  Filled 2019-10-01: qty 10

## 2019-10-01 MED ORDER — ROCURONIUM 10MG/ML (10ML) SYRINGE FOR MEDFUSION PUMP - OPTIME
INTRAVENOUS | Status: DC | PRN
  Administered 2019-10-01 (×4): 10 mg via INTRAVENOUS
  Administered 2019-10-01: 40 mg via INTRAVENOUS

## 2019-10-01 MED ORDER — CHLORHEXIDINE GLUCONATE CLOTH 2 % EX PADS: 6.0000 | MEDICATED_PAD | Freq: Once | CUTANEOUS | Status: DC

## 2019-10-01 MED ORDER — METHOCARBAMOL 500 MG PO TABS: 500.0000 mg | ORAL_TABLET | Freq: Four times a day (QID) | ORAL | Status: DC | PRN

## 2019-10-01 MED ORDER — FENTANYL CITRATE (PF) 100 MCG/2ML IJ SOLN
INTRAMUSCULAR | Status: DC | PRN
  Administered 2019-10-01: 100 ug via INTRAVENOUS
  Administered 2019-10-01 (×4): 50 ug via INTRAVENOUS
  Administered 2019-10-01: 100 ug via INTRAVENOUS
  Administered 2019-10-01 (×2): 50 ug via INTRAVENOUS

## 2019-10-01 MED ORDER — ACETAMINOPHEN 500 MG PO TABS
1000.0000 mg | ORAL_TABLET | Freq: Four times a day (QID) | ORAL | Status: DC
  Administered 2019-10-01 – 2019-10-04 (×11): 1000 mg via ORAL
  Filled 2019-10-01 (×12): qty 2

## 2019-10-01 MED ORDER — SUGAMMADEX SODIUM 200 MG/2ML IV SOLN
INTRAVENOUS | Status: DC | PRN
  Administered 2019-10-01: 200 mg via INTRAVENOUS

## 2019-10-01 MED ORDER — BACITRACIN-NEOMYCIN-POLYMYXIN 400-5-5000 EX OINT
TOPICAL_OINTMENT | CUTANEOUS | Status: DC | PRN
  Administered 2019-10-01: 1 via TOPICAL

## 2019-10-01 MED ORDER — SUCCINYLCHOLINE CHLORIDE 200 MG/10ML IV SOSY
PREFILLED_SYRINGE | INTRAVENOUS | Status: AC
  Filled 2019-10-01: qty 10

## 2019-10-01 MED ORDER — ONDANSETRON HCL 4 MG/2ML IJ SOLN
INTRAMUSCULAR | Status: DC | PRN
  Administered 2019-10-01: 4 mg via INTRAVENOUS

## 2019-10-01 MED ORDER — ONDANSETRON 4 MG PO TBDP: 4.0000 mg | ORAL_TABLET | Freq: Four times a day (QID) | ORAL | Status: DC | PRN

## 2019-10-01 MED ORDER — MORPHINE SULFATE (PF) 2 MG/ML IV SOLN
2.0000 mg | INTRAVENOUS | Status: DC | PRN
  Administered 2019-10-01: 2 mg via INTRAVENOUS
  Filled 2019-10-01: qty 1

## 2019-10-01 MED ORDER — ACETAMINOPHEN 500 MG PO TABS
1000.0000 mg | ORAL_TABLET | ORAL | Status: AC
  Administered 2019-10-01: 1000 mg via ORAL

## 2019-10-01 MED ORDER — ONDANSETRON HCL 4 MG/2ML IJ SOLN
INTRAMUSCULAR | Status: AC
  Filled 2019-10-01: qty 2

## 2019-10-01 MED ORDER — LACTATED RINGERS IV SOLN: INTRAVENOUS | Status: DC | PRN

## 2019-10-01 MED ORDER — KETOROLAC TROMETHAMINE 30 MG/ML IJ SOLN: 30.0000 mg | Freq: Four times a day (QID) | INTRAMUSCULAR | Status: DC | PRN

## 2019-10-01 MED ORDER — HEPARIN SODIUM (PORCINE) 5000 UNIT/ML IJ SOLN
5000.0000 [IU] | Freq: Once | INTRAMUSCULAR | Status: AC
  Administered 2019-10-01: 5000 [IU] via SUBCUTANEOUS

## 2019-10-01 MED ORDER — KETOROLAC TROMETHAMINE 30 MG/ML IJ SOLN
30.0000 mg | Freq: Four times a day (QID) | INTRAMUSCULAR | Status: AC
  Administered 2019-10-01: 30 mg via INTRAVENOUS
  Filled 2019-10-01: qty 1

## 2019-10-01 MED ORDER — SODIUM CHLORIDE 0.9 % IV SOLN
INTRAVENOUS | Status: AC
  Filled 2019-10-01: qty 2

## 2019-10-01 MED ORDER — LIDOCAINE HCL (CARDIAC) PF 50 MG/5ML IV SOSY
PREFILLED_SYRINGE | INTRAVENOUS | Status: DC | PRN
  Administered 2019-10-01: 80 mg via INTRAVENOUS

## 2019-10-01 MED ORDER — PROPOFOL 10 MG/ML IV BOLUS
INTRAVENOUS | Status: DC | PRN
  Administered 2019-10-01: 200 mg via INTRAVENOUS

## 2019-10-01 MED ORDER — DOCUSATE SODIUM 100 MG PO CAPS
100.0000 mg | ORAL_CAPSULE | Freq: Two times a day (BID) | ORAL | Status: DC
  Administered 2019-10-01 – 2019-10-04 (×6): 100 mg via ORAL
  Filled 2019-10-01 (×6): qty 1

## 2019-10-01 MED ORDER — MEPERIDINE HCL 50 MG/ML IJ SOLN: 6.2500 mg | INTRAMUSCULAR | Status: DC | PRN

## 2019-10-01 MED ORDER — ACETAMINOPHEN 500 MG PO TABS
ORAL_TABLET | ORAL | Status: AC
  Filled 2019-10-01: qty 2

## 2019-10-01 MED ORDER — SUCCINYLCHOLINE 20MG/ML (10ML) SYRINGE FOR MEDFUSION PUMP - OPTIME
INTRAMUSCULAR | Status: DC | PRN
  Administered 2019-10-01: 150 mg via INTRAVENOUS

## 2019-10-01 MED ORDER — HYDROMORPHONE HCL 1 MG/ML IJ SOLN
0.2500 mg | INTRAMUSCULAR | Status: DC | PRN
  Administered 2019-10-01: 0.5 mg via INTRAVENOUS
  Filled 2019-10-01: qty 0.5

## 2019-10-01 MED ORDER — PROMETHAZINE HCL 25 MG/ML IJ SOLN: 6.2500 mg | INTRAMUSCULAR | Status: DC | PRN

## 2019-10-01 MED ORDER — FLUOXETINE HCL 20 MG PO CAPS
20.0000 mg | ORAL_CAPSULE | Freq: Every day | ORAL | Status: DC
  Administered 2019-10-01 – 2019-10-04 (×4): 20 mg via ORAL
  Filled 2019-10-01 (×5): qty 1

## 2019-10-01 MED ORDER — ZOLPIDEM TARTRATE 5 MG PO TABS: 5.0000 mg | ORAL_TABLET | Freq: Every evening | ORAL | Status: DC | PRN

## 2019-10-01 MED ORDER — LACTATED RINGERS IV SOLN
Freq: Once | INTRAVENOUS | Status: AC
  Administered 2019-10-01: 08:00:00 1000 mL via INTRAVENOUS

## 2019-10-01 MED ORDER — FENTANYL CITRATE (PF) 250 MCG/5ML IJ SOLN
INTRAMUSCULAR | Status: AC
  Filled 2019-10-01: qty 5

## 2019-10-01 MED ORDER — GABAPENTIN 300 MG PO CAPS
300.0000 mg | ORAL_CAPSULE | Freq: Two times a day (BID) | ORAL | Status: DC
  Administered 2019-10-02 – 2019-10-04 (×5): 300 mg via ORAL
  Filled 2019-10-01 (×6): qty 1

## 2019-10-01 MED ORDER — ONDANSETRON HCL 4 MG/2ML IJ SOLN
4.0000 mg | Freq: Four times a day (QID) | INTRAMUSCULAR | Status: DC | PRN
  Administered 2019-10-03: 4 mg via INTRAVENOUS
  Filled 2019-10-01: qty 2

## 2019-10-01 MED ORDER — BUPIVACAINE LIPOSOME 1.3 % IJ SUSP
INTRAMUSCULAR | Status: DC | PRN
  Administered 2019-10-01: 20 mL

## 2019-10-01 MED ORDER — ALVIMOPAN 12 MG PO CAPS
ORAL_CAPSULE | ORAL | Status: AC
  Filled 2019-10-01: qty 1

## 2019-10-01 MED ORDER — SIMETHICONE 80 MG PO CHEW: 40.0000 mg | CHEWABLE_TABLET | Freq: Four times a day (QID) | ORAL | Status: DC | PRN

## 2019-10-01 MED ORDER — ALVIMOPAN 12 MG PO CAPS
12.0000 mg | ORAL_CAPSULE | ORAL | Status: AC
  Administered 2019-10-01: 12 mg via ORAL

## 2019-10-01 MED ORDER — DIPHENHYDRAMINE HCL 50 MG/ML IJ SOLN: 12.5000 mg | Freq: Four times a day (QID) | INTRAMUSCULAR | Status: DC | PRN

## 2019-10-01 MED ORDER — HEPARIN SODIUM (PORCINE) 5000 UNIT/ML IJ SOLN
5000.0000 [IU] | Freq: Three times a day (TID) | INTRAMUSCULAR | Status: DC
  Administered 2019-10-01 – 2019-10-02 (×3): 5000 [IU] via SUBCUTANEOUS
  Filled 2019-10-01 (×3): qty 1

## 2019-10-01 MED ORDER — MIDAZOLAM HCL 5 MG/5ML IJ SOLN
INTRAMUSCULAR | Status: DC | PRN
  Administered 2019-10-01: 2 mg via INTRAVENOUS

## 2019-10-01 MED ORDER — SODIUM CHLORIDE 0.9 % IV SOLN
2.0000 g | Freq: Two times a day (BID) | INTRAVENOUS | Status: AC
  Administered 2019-10-01 – 2019-10-03 (×4): 2 g via INTRAVENOUS
  Filled 2019-10-01 (×5): qty 2

## 2019-10-01 MED ORDER — MIDAZOLAM HCL 2 MG/2ML IJ SOLN
INTRAMUSCULAR | Status: AC
  Filled 2019-10-01: qty 2

## 2019-10-01 MED ORDER — OXYCODONE HCL 5 MG PO TABS
5.0000 mg | ORAL_TABLET | ORAL | Status: DC | PRN
  Administered 2019-10-01 – 2019-10-04 (×11): 10 mg via ORAL
  Filled 2019-10-01 (×11): qty 2

## 2019-10-01 MED ORDER — LACTATED RINGERS IV SOLN: INTRAVENOUS | Status: DC

## 2019-10-01 MED ORDER — FENTANYL CITRATE (PF) 100 MCG/2ML IJ SOLN
INTRAMUSCULAR | Status: AC
  Filled 2019-10-01: qty 2

## 2019-10-01 MED ORDER — DIPHENHYDRAMINE HCL 12.5 MG/5ML PO ELIX: 12.5000 mg | ORAL_SOLUTION | Freq: Four times a day (QID) | ORAL | Status: DC | PRN

## 2019-10-01 MED ORDER — BUPIVACAINE LIPOSOME 1.3 % IJ SUSP
INTRAMUSCULAR | Status: AC
  Filled 2019-10-01: qty 20

## 2019-10-01 MED ORDER — ALVIMOPAN 12 MG PO CAPS
12.0000 mg | ORAL_CAPSULE | Freq: Two times a day (BID) | ORAL | Status: DC
  Administered 2019-10-02 (×2): 12 mg via ORAL
  Filled 2019-10-01 (×3): qty 1

## 2019-10-01 MED ORDER — 0.9 % SODIUM CHLORIDE (POUR BTL) OPTIME
TOPICAL | Status: DC | PRN
  Administered 2019-10-01 (×3): 1000 mL

## 2019-10-01 MED ORDER — HEPARIN SODIUM (PORCINE) 5000 UNIT/ML IJ SOLN
INTRAMUSCULAR | Status: AC
  Filled 2019-10-01: qty 1

## 2019-10-01 MED ORDER — BACITRACIN-NEOMYCIN-POLYMYXIN 400-5-5000 EX OINT
TOPICAL_OINTMENT | CUTANEOUS | Status: AC
  Filled 2019-10-01: qty 1

## 2019-10-01 MED ORDER — METOPROLOL TARTRATE 5 MG/5ML IV SOLN: 5.0000 mg | Freq: Four times a day (QID) | INTRAVENOUS | Status: DC | PRN

## 2019-10-01 MED ORDER — SODIUM CHLORIDE 0.9 % IV SOLN
2.0000 g | INTRAVENOUS | Status: AC
  Administered 2019-10-01: 2 g via INTRAVENOUS

## 2019-10-01 MED ORDER — PANTOPRAZOLE SODIUM 40 MG IV SOLR
40.0000 mg | Freq: Every day | INTRAVENOUS | Status: DC
  Administered 2019-10-01 – 2019-10-03 (×3): 40 mg via INTRAVENOUS
  Filled 2019-10-01 (×3): qty 40

## 2019-10-01 MED ORDER — PROPOFOL 10 MG/ML IV BOLUS
INTRAVENOUS | Status: AC
  Filled 2019-10-01: qty 20

## 2019-10-01 MED ORDER — LIDOCAINE 2% (20 MG/ML) 5 ML SYRINGE
INTRAMUSCULAR | Status: AC
  Filled 2019-10-01: qty 5

## 2019-10-01 SURGICAL SUPPLY — 63 items
APL SWBSTK 6 STRL LF DISP (MISCELLANEOUS) ×1
APPLICATOR COTTON TIP 6 STRL (MISCELLANEOUS) IMPLANT
APPLICATOR COTTON TIP 6IN STRL (MISCELLANEOUS) ×3
COVER LIGHT HANDLE STERIS (MISCELLANEOUS) ×6 IMPLANT
COVER WAND RF STERILE (DRAPES) ×3 IMPLANT
DISSECTOR BLUNT TIP ENDO 5MM (MISCELLANEOUS) ×2 IMPLANT
DRSG OPSITE POSTOP 4X8 (GAUZE/BANDAGES/DRESSINGS) ×3 IMPLANT
DRSG TEGADERM 2-3/8X2-3/4 SM (GAUZE/BANDAGES/DRESSINGS) ×4 IMPLANT
ELECT REM PT RETURN 9FT ADLT (ELECTROSURGICAL) ×3
ELECTRODE REM PT RTRN 9FT ADLT (ELECTROSURGICAL) ×1 IMPLANT
GAUZE 4X4 16PLY RFD (DISPOSABLE) ×2 IMPLANT
GLOVE BIO SURGEON STRL SZ 6.5 (GLOVE) ×4 IMPLANT
GLOVE BIO SURGEON STRL SZ7 (GLOVE) ×4 IMPLANT
GLOVE BIO SURGEONS STRL SZ 6.5 (GLOVE) ×2
GLOVE BIOGEL PI IND STRL 6.5 (GLOVE) ×2 IMPLANT
GLOVE BIOGEL PI IND STRL 7.0 (GLOVE) ×6 IMPLANT
GLOVE BIOGEL PI INDICATOR 6.5 (GLOVE) ×8
GLOVE BIOGEL PI INDICATOR 7.0 (GLOVE) ×12
GLOVE SURG SS PI 7.5 STRL IVOR (GLOVE) ×6 IMPLANT
GOWN STRL REUS W/ TWL LRG LVL3 (GOWN DISPOSABLE) IMPLANT
GOWN STRL REUS W/TWL LRG LVL3 (GOWN DISPOSABLE) ×21 IMPLANT
INST SET LAPROSCOPIC AP (KITS) ×3 IMPLANT
INST SET MAJOR GENERAL (KITS) ×3 IMPLANT
KIT TURNOVER CYSTO (KITS) ×3 IMPLANT
LIGASURE IMPACT 36 18CM CVD LR (INSTRUMENTS) ×2 IMPLANT
LIGASURE LAP ATLAS 10MM 37CM (INSTRUMENTS) ×2 IMPLANT
MANIFOLD NEPTUNE II (INSTRUMENTS) ×3 IMPLANT
NDL HYPO 18GX1.5 BLUNT FILL (NEEDLE) ×1 IMPLANT
NEEDLE HYPO 18GX1.5 BLUNT FILL (NEEDLE) ×3 IMPLANT
NEEDLE HYPO 22GX1.5 SAFETY (NEEDLE) ×3 IMPLANT
NS IRRIG 1000ML POUR BTL (IV SOLUTION) ×9 IMPLANT
PACK COLON (CUSTOM PROCEDURE TRAY) ×3 IMPLANT
PAD ARMBOARD 7.5X6 YLW CONV (MISCELLANEOUS) ×3 IMPLANT
PENCIL HANDSWITCHING (ELECTRODE) ×3 IMPLANT
RELOAD PROXIMATE 75MM BLUE (ENDOMECHANICALS) ×9 IMPLANT
RELOAD PROXIMATE TA60MM GREEN (ENDOMECHANICALS) ×6 IMPLANT
RELOAD STAPLE 60 4.7 GRN THCK (ENDOMECHANICALS) IMPLANT
RELOAD STAPLE 75 3.8 BLU REG (ENDOMECHANICALS) IMPLANT
RELOAD STAPLER LINE PROX 60 GR (STAPLE) ×1 IMPLANT
RETRACTOR WOUND ALXS 18CM MED (MISCELLANEOUS) IMPLANT
RTRCTR WOUND ALEXIS O 18CM MED (MISCELLANEOUS) ×3
SET TUBE SMOKE EVAC HIGH FLOW (TUBING) ×3 IMPLANT
SHEET LAVH (DRAPES) ×2 IMPLANT
SPONGE GAUZE 2X2 8PLY STER LF (GAUZE/BANDAGES/DRESSINGS) ×2
SPONGE GAUZE 2X2 8PLY STRL LF (GAUZE/BANDAGES/DRESSINGS) ×2 IMPLANT
SPONGE LAP 18X18 RF (DISPOSABLE) ×10 IMPLANT
STAPLER GUN LINEAR PROX 60 (STAPLE) ×3 IMPLANT
STAPLER PROXIMATE 75MM BLUE (STAPLE) ×2 IMPLANT
STAPLER RELOAD LINE PROX 60 GR (STAPLE) ×3
STAPLER RELOADABLE 60 GRN THCK (STAPLE) IMPLANT
STAPLER VISISTAT (STAPLE) ×3 IMPLANT
SUT PDS AB CT VIOLET #0 27IN (SUTURE) ×6 IMPLANT
SUT SILK 3 0 SH CR/8 (SUTURE) ×7 IMPLANT
SUT VIC AB 3-0 SH 27 (SUTURE) ×9
SUT VIC AB 3-0 SH 27X BRD (SUTURE) ×1 IMPLANT
SYR 10ML LL (SYRINGE) ×5 IMPLANT
SYR 20ML LL LF (SYRINGE) ×6 IMPLANT
TRAY FOLEY MTR SLVR 16FR STAT (SET/KITS/TRAYS/PACK) ×3 IMPLANT
TROCAR ENDO BLADELESS 11MM (ENDOMECHANICALS) ×3 IMPLANT
TROCAR XCEL NON-BLD 5MMX100MML (ENDOMECHANICALS) ×3 IMPLANT
TROCAR XCEL UNIV SLVE 11M 100M (ENDOMECHANICALS) ×3 IMPLANT
WARMER LAPAROSCOPE (MISCELLANEOUS) ×3 IMPLANT
YANKAUER SUCT BULB TIP 10FT TU (MISCELLANEOUS) ×6 IMPLANT

## 2019-10-01 NOTE — Interval H&P Note (Signed)
History and Physical Interval Note:  10/01/2019 8:57 AM  Juan Lopez  has presented today for surgery, with the diagnosis of Diverticulitis.  The various methods of treatment have been discussed with the patient and family. After consideration of risks, benefits and other options for treatment, the patient has consented to  Procedure(s): LAPAROSCOPIC PARTIAL COLECTOMY ([name]) as a surgical intervention.  The patient's history has been reviewed, patient examined, no change in status, stable for surgery.  I have reviewed the patient's chart and labs.  Questions were answered to the patient's satisfaction.    No recent flares. Completed prep and was clear. Overall doing well.  Virl Cagey

## 2019-10-01 NOTE — Anesthesia Procedure Notes (Signed)
Procedure Name: Intubation Date/Time: 10/01/2019 9:43 AM Performed by: Ollen Bowl, CRNA Pre-anesthesia Checklist: Patient identified, Patient being monitored, Timeout performed, Emergency Drugs available and Suction available Patient Re-evaluated:Patient Re-evaluated prior to induction Oxygen Delivery Method: Circle system utilized Preoxygenation: Pre-oxygenation with 100% oxygen Induction Type: IV induction Ventilation: Mask ventilation without difficulty Laryngoscope Size: Mac and 4 Grade View: Grade I Tube type: Oral Tube size: 7.5 mm Number of attempts: 1 Airway Equipment and Method: Stylet Placement Confirmation: ETT inserted through vocal cords under direct vision,  positive ETCO2 and breath sounds checked- equal and bilateral Secured at: 23 cm Tube secured with: Tape Dental Injury: Teeth and Oropharynx as per pre-operative assessment

## 2019-10-01 NOTE — Transfer of Care (Signed)
Immediate Anesthesia Transfer of Care Note  Patient: Juan Lopez  Procedure(s) Performed: LAPAROSCOPIC PARTIAL COLECTOMY ([name] Abdomen)  Patient Location: PACU  Anesthesia Type:General  Level of Consciousness: awake  Airway & Oxygen Therapy: Patient Spontanous Breathing and Patient connected to face mask oxygen  Post-op Assessment: Report given to RN  Post vital signs: Reviewed and stable  Last Vitals:  Vitals Value Taken Time  BP 144/101 10/01/19 1241  Temp    Pulse 98 10/01/19 1243  Resp 16 10/01/19 1243  SpO2 98 % 10/01/19 1243  Vitals shown include unvalidated device data.  Last Pain:  Vitals:   10/01/19 0758  TempSrc: Oral  PainSc: 0-No pain      Patients Stated Pain Goal: 7 ([id number])  Complications: No apparent anesthesia complications

## 2019-10-01 NOTE — Anesthesia Postprocedure Evaluation (Signed)
Anesthesia Post Note  Patient: Juan Lopez  Procedure(s) Performed: LAPAROSCOPIC PARTIAL COLECTOMY ([name] Abdomen)  Patient location during evaluation: PACU Anesthesia Type: General Level of consciousness: awake and alert and oriented Pain management: pain level controlled Vital Signs Assessment: post-procedure vital signs reviewed and stable Respiratory status: spontaneous breathing Cardiovascular status: blood pressure returned to baseline and stable Postop Assessment: no apparent nausea or vomiting Anesthetic complications: no     Last Vitals:  Vitals:   10/01/19 1417 10/01/19 1417  BP: (!) 141/97 (!) 141/97  Pulse: 93 93  Resp: 16   Temp: 36.6 C 36.6 C  SpO2: 94% 94%    Last Pain:  Vitals:   10/01/19 1417  TempSrc: Oral  PainSc:                  [name]

## 2019-10-01 NOTE — Progress Notes (Signed)
[facility]  Called wife and notified her that surgery was done. Plan for clears, abdominal binder, multimodule pain control. Foley overnight.   Curlene Labrum, MD [facility] [address] [place], [place] [id number] [phone] (office)

## 2019-10-01 NOTE — Anesthesia Preprocedure Evaluation (Signed)
Anesthesia Evaluation  Patient identified by MRN, date of birth, ID band Patient awake    Reviewed: Allergy & Precautions, NPO status , Patient's Chart, lab work & pertinent test results  Airway Mallampati: III  TM Distance: >3 FB Neck ROM: Full    Dental no notable dental hx.    Pulmonary Current Smoker and Patient abstained from smoking.,    Pulmonary exam normal breath sounds clear to auscultation       Cardiovascular Exercise Tolerance: Good METS: 3 - Mets hypertension, Pt. on medications + CAD (Pericarditis - 2018, negative cath)  Normal cardiovascular exam Rhythm:Regular Rate:Normal  25-Sep-2019 10:05:57 [place] System-AP-300 ROUTINE RECORD Normal sinus rhythm Left posterior fascicular block Abnormal ECG  Cath - 2018  The left ventricular systolic function is normal.  LV end diastolic pressure is mildly elevated.  The left ventricular ejection fraction is 55-65% by visual estimate.   1. No significant CAD 2. Normal LV function 3. Mildly elevated LVEDP.  Plan: medical management for myocarditis  Echo -  Left ventricle: The cavity size was normal. Systolic function was   normal. The estimated ejection fraction was in the range of 55%   to 60%. Wall motion was normal; there were no regional wall   motion abnormalities. Left ventricular diastolic function   parameters were normal. - Aortic valve: Trileaflet; normal thickness leaflets. There was no   regurgitation. - Aortic root: The aortic root was normal in size. - Left atrium: The atrium was normal in size. - Right ventricle: Systolic function was normal. - Tricuspid valve: There was mild regurgitation. - Pulmonic valve: There was no regurgitation. - Pulmonary arteries: Systolic pressure was within the normal   range. - Inferior vena cava: The vessel was normal in size. - Pericardium, extracardiac: There was no pericardial effusion.     Neuro/Psych Anxiety negative neurological ROS     GI/Hepatic Neg liver ROS, Sigmoid diverticulitis    Endo/Other  negative endocrine ROS  Renal/GU negative Renal ROS     Musculoskeletal   Abdominal   Peds  Hematology  (+) Blood dyscrasia (H/HcT -18/55.7), , H/HcT -18/55.7   Anesthesia Other Findings   Reproductive/Obstetrics                             Anesthesia Physical Anesthesia Plan  ASA: II  Anesthesia Plan: General   Post-op Pain Management:    Induction: Intravenous  PONV Risk Score and Plan: 3 and Ondansetron, Dexamethasone and Midazolam  Airway Management Planned: Oral ETT  Additional Equipment:   Intra-op Plan:   Post-operative Plan: Extubation in OR  Informed Consent: I have reviewed the patients History and Physical, chart, labs and discussed the procedure including the risks, benefits and alternatives for the proposed anesthesia with the patient or authorized representative who has indicated his/her understanding and acceptance.     Dental advisory given  Plan Discussed with: CRNA  Anesthesia Plan Comments:         Anesthesia Quick Evaluation

## 2019-10-01 NOTE — Progress Notes (Signed)
Patient had good technique and effort.

## 2019-10-01 NOTE — Op Note (Signed)
[facility] Operative Note  10/01/19  Preoperative Diagnosis:  Sigmoid diverticulitis    Postoperative Diagnosis: Same   Procedure(s) Performed: Laparoscopic sigmoid colectomy (side to side anastomosis)    Surgeon: Lanell Matar. Constance Haw, MD   Assistants: Aviva Signs, MD     Anesthesia: General endotracheal   Anesthesiologist: Denese Killings, MD    Specimens: Sigmoid colon (staple line proximal); additional distal margin suture marking (staple line proximal)   Estimated Blood Loss: Minimal   Blood Replacement: None    Complications: None   Wound Class: Clean contaminated    Operative Indications: [name] is a 38 yo with recurrent diverticulitis that has been having repeat episodes and needs to have the portion of his colon resected. He had a colonoscopy 2019 that demonstrated sigmoid and rectosigmoid diverticula. We discussed the risk and benefits of a laparoscopic partial colectomy including but not limited to bleeding, infection, anastomotic leak, injury to the ureter, need for colostomy.  He has completed a bowel preparation prior to surgery.   Findings: Redundant sigmoid colon, thickened diseased segment with diverticula, remaining colon and rectum healthy and soft; adhesions to the left pelvic side wall    Procedure: The patient was taken to the operating room and placed supine. General endotracheal anesthesia was induced. Intravenous antibiotics were administered per protocol.  An orogastric tube positioned to decompress the stomach. A foley catheter was placed. The patient was then put in lithotomy with the right arm tucked, all pressure points were padded.  The abdomen and perineum were prepared and draped in the usual sterile fashion.   A infraumbilical incision was made an a towel was used to elevate the abdominal wall. The Veress needle was inserted and the saline drop test and low insufflation pressures indicated intraperitoneal access.  The  abdomen was insufflated, and a 11 mm trocar was placed under direct visualization into the abdomen. There was no signs of injury from the Veress or the trocar. Additional trocars were placed under direct visualization in the right  lower quadrant (11 mm trocar) and right mid abdomen mid clavicular line (5 mm trocar).  The patient was placed in Trendelenburg with this right side down. This allowed the redundant sigmoid colon and small bowel to fall to the right side.  There was scarring from previous diverticulitis between the disease portion of thickened sigmoid colon and the left pelvic side wall. This was taken down with care with electrocautery using scissors and blunt dissection using the suction and peanuts.  A epiploic fat appendage was adherent at the left pelvic side wall at the pelvic brim, and this was taken with a Ligasure.  The white line of [place] was taken from the left pelvic brim upward to the splenic flexure.   The colon was very redundant and there was a large amount of epiploic fat.  The sigmoid colon could be elevated out of the pelvis and straightened. The rectum and lower rectosigmoid junction was soft and there were no signs of diverticula.  There was a short segment of sigmoid colon that was thickened with thickened mesentery. The sigmoid colon was elevated superior at the pelvic brim and the mesentery was scored on the right side down into the pelvis and upward onto the mesentery.  Due to the thickness of the colon and the trajectory of the scissors, I was not able to score the left side sufficiently.  There was adequate length of the colon to eviscerate it out of the abdomen and given that this  was not for cancer, I wanted to take the mesentery high to avoid any ureter injury. The left ureter was identified and protected.    From here, we opted to proceed with opening the extraction site. The infraumbilical trocar was used to extended the incision to be about 5-6cm in size, and a small  wound protector was placed. The right lower and mid abdomen trocars were removed. The colon was eviscerated from the abdomen. The proximal sigmoid colon was very soft and the sigmoid colon was redundant. The point on the proximal colon was identified and a 70 mm linear cutting stapler was used to transect the colon.  The Ligasure was used to take the mesentery close to the colon. The ureters and the retroperitoneum were protected.  After this mobilization, the proximal colon was packed into the abdomen and the left side of the mesentery was scored down into the pelvis to allow for further mobilization and straightening of the rectum.   The distal point of transection was determined at the distal sigmoid. This felt soft but the posterior wall felt somewhat thickened.  A TX linear 60 mm cutting stapler for thick tissue was used to transect the tissue.  The remaining mesentery was taken with the [place].  There was a small area on the distal stump that still had some posterior thickening, and to be safe, an additional margin was taken using the same TX linear cutting stapler thick load.  The mesentery was taken with the Ligasure. The additional distal margin was marked with a silk suture. Both specimen had staple lines proximal from the standard linear cutting stapler and the distal margins were open from the [place] linear cutting stapler. The bowel was then aligned without any tension for the distal and proximal colon to align for anastomosis as there was sufficient colon to perform a side to side anastomosis and avoid the EEA stapler.  The standard linear cutting 70 mm stapler was used to great the common channel, and the TX linear cutting thick load 60 mm was used to close the common colotomy that was made trying to avoid crossing staple lines.  The staple line was inspected, and a portion of the common colotomy was opened where the posterior wall had pulled through during the [place] linear firing.  This was closed in two  layers with a Connell 3-0 Vicryl suture technique and 3-0 silk Lembert sutures. There were no signs of any mucosa. The staple line and the crotch of the common channel were oversewn with 3-0 Lembert silk suture, and the abundant epiploic fat was used to cover the staple line. The anastomosis was patent and two fingerbreadths.  There was no tension or twisting of the colon. The mesenteric defect was left open.  The abdomen was irrigated.   The wound protector was removed. The entire team changed gown and gloves and new closing instruments were used. The extraction site was closed with running 0 PDS suture in the standard fashion. The port sites were small. The skin on the port sites and midline were closed with staples. The right mid abdomen port site had some bleeding and a 3-0 Vicryl was placed deep to control bleeding. Cautery was also performed. There was some skin irritation from the cautery.  A honeycomb dressing was placed and 2X2 and Tegaderm was placed on the port sites after some neosporin given the skin irritation from the cautery.   Dr. Arnoldo Morale was assisting throughout the procedure and was present for the critical portions  of the case.   Final inspection revealed acceptable hemostasis. All counts were correct at the end of the case. The patient was awakened from anesthesia and extubated without complication.  The patient went to the PACU in stable condition.   Curlene Labrum, MD [facility] [address] [place], [place] [address] [phone] (office)

## 2019-10-02 LAB — BASIC METABOLIC PANEL
Anion gap: 11 (ref 5–15)
BUN: 9 mg/dL (ref 6–20)
CO2: 24 mmol/L (ref 22–32)
Calcium: 8.7 mg/dL — ABNORMAL LOW (ref 8.9–10.3)
Chloride: 102 mmol/L (ref 98–111)
Creatinine, Ser: 0.86 mg/dL (ref 0.61–1.24)
GFR calc Af Amer: >60 mL/min (ref >60)
GFR calc non Af Amer: >60 mL/min (ref >60)
Glucose, Bld: 112 mg/dL — ABNORMAL HIGH (ref 70–99)
Potassium: 4.3 mmol/L (ref 3.5–5.1)
Sodium: 137 mmol/L (ref 135–145)

## 2019-10-02 LAB — CBC WITH DIFFERENTIAL/PLATELET
Abs Immature Granulocytes: 0.08 10*3/uL — ABNORMAL HIGH (ref 0.00–0.07)
Basophils Absolute: 0 10*3/uL (ref 0.0–0.1)
Basophils Relative: 0 %
Eosinophils Absolute: 0 10*3/uL (ref 0.0–0.5)
Eosinophils Relative: 0 %
HCT: 51.4 % (ref 39.0–52.0)
Hemoglobin: 16.6 g/dL (ref 13.0–17.0)
Immature Granulocytes: 1 %
Lymphocytes Relative: 13 %
Lymphs Abs: 2.1 10*3/uL (ref 0.7–4.0)
MCH: 30.5 pg (ref 26.0–34.0)
MCHC: 32.3 g/dL (ref 30.0–36.0)
MCV: 94.5 fL (ref 80.0–100.0)
Monocytes Absolute: 1 10*3/uL (ref 0.1–1.0)
Monocytes Relative: 7 %
Neutro Abs: 12.3 10*3/uL — ABNORMAL HIGH (ref 1.7–7.7)
Neutrophils Relative %: 79 %
Platelets: 279 10*3/uL (ref 150–400)
RBC: 5.44 MIL/uL (ref 4.22–5.81)
RDW: 13.1 % (ref 11.5–15.5)
WBC: 15.5 10*3/uL — ABNORMAL HIGH (ref 4.0–10.5)
nRBC: 0 % (ref 0.0–0.2)

## 2019-10-02 LAB — PHOSPHORUS: Phosphorus: 3.9 mg/dL (ref 2.5–4.6)

## 2019-10-02 LAB — MAGNESIUM: Magnesium: 2.1 mg/dL (ref 1.7–2.4)

## 2019-10-02 MED ORDER — ENOXAPARIN SODIUM 60 MG/0.6ML ~~LOC~~ SOLN
60.0000 mg | SUBCUTANEOUS | Status: DC
  Administered 2019-10-02 – 2019-10-03 (×2): 60 mg via SUBCUTANEOUS
  Filled 2019-10-02 (×2): qty 0.6

## 2019-10-02 NOTE — Progress Notes (Signed)
1 Day Post-Op  Subjective: Juan Lopez is a very pleasant 38 year old gentleman POD#1 following partial sigmoid colectomy for recurrent diverticulitis.  Overall, he is doing well with no acute overnight events. He does endorse 8/10 central abdominal pain, "like someone's punching me in the gut". He had similar pain overnight, which was relieved with prn medications: he received prn oxy x2 and prn morphine x1 since operation. Pain does not radiate. He has been tolerating his liquid diet well. Endorses flatus. Feels as though he could get up and walk around unassisted once foley is removed. Denies any chest pain, shortness of breath, chills, night sweats, or feeling faint or feverish.   Pt encouraged to notify nursing re: current pain and was given prn oxy and first dose of scheduled entereg.   Objective: Vital signs in last 24 hours: Temp:  [97.9 F (36.6 C)-99.3 F (37.4 C)] 97.9 F (36.6 C) (12/29 0139) Pulse Rate:  [78-104] 82 (12/29 0139) Resp:  [0-20] 18 (12/29 0139) BP: (116-157)/(67-110) 116/67 (12/29 0139) SpO2:  [91 %-99 %] 94 % (12/29 0139) Weight:  [118.7 kg] 118.7 kg (12/28 1417) Last BM Date: 09/30/19  Intake/Output from previous day: 12/28 0701 - 12/29 0700 In: 5234.3 [P.O.:720; I.V.:4414.3; IV Piggyback:100] Out: 2051 [Urine:1801; Emesis/NG output:100; Blood:150] Intake/Output this shift: No intake/output data recorded.  Physical Exam:  General: Well appearing, in no acute distress HEENT: No cervical or supraclavicular lymphadenopathy Cardio: Normal S1, S2, no murmurs, rubs or gallops appreciated Pulmonary: Lungs CTAB, pt breathing comfortably on RA Extremities: No LE edema, SCDs removed Abdominal: Normoactive bowel sounds, abdomen soft and non-tender to palpation with no rebound tenderness or guarding Skin/Incisions: Honeycomb dressing in place over central incision-- not removed for this exam, but appears to be healing well, staples in place. No warmth, no point  tenderness, very mild erythema around staples. No point tenderness or warmth over lateral trochanter sites. GU: Good urine output with foley-- urine amber, not cloudy, no hematuria noted  Lab Results:  Recent Labs    10/02/19 0450  WBC 15.5*  HGB 16.6  HCT 51.4  PLT 279   BMET Recent Labs    10/02/19 0450  NA 137  K 4.3  CL 102  CO2 24  GLUCOSE 112*  BUN 9  CREATININE 0.86  CALCIUM 8.7*   PT/INR No results for input(s): LABPROT, INR in the last 72 hours.  Studies/Results: No results found.  Anti-infectives: Anti-infectives (From admission, onward)   Start     Dose/Rate Route Frequency Ordered Stop   10/01/19 2200  cefoTEtan (CEFOTAN) 2 g in sodium chloride 0.9 % 100 mL IVPB     2 g 200 mL/hr over 30 Minutes Intravenous Every 12 hours 10/01/19 1411 10/03/19 2159   10/01/19 0745  cefoTEtan (CEFOTAN) 2 g in sodium chloride 0.9 % 100 mL IVPB     2 g 200 mL/hr over 30 Minutes Intravenous On call to O.R. 10/01/19 0742 10/01/19 0925      Assessment/Plan:  Juan Lopez is a very pleasant 38 year old gentleman POD#1 following partial sigmoid colectomy for recurrent diverticulitis, recovering well.  Neuro: Abdominal pain relieved by prn oxy. Will begin entereg this AM, keep prns onboard for now in addition to scheduled tylenol. Continue scheduled gabapentin and home fluoxetine.  Pulm: Incentive spirometry GI: Okay to advance to soft foods. Starting entereg today. Continue pantoprazole injections. GU: D/C foley today, Mag, Phos, K wnl ID: Leukocytosis with neutrophilia noted on AM CBC. Pt afebrile, without tachycardia and  is well-appearing. Likely 2/2 post-op inflammation. Will continue to monitor and continue IV cefotan. Prophylaxis: Encourage ambulation, heparin q8 hours, incentive spirometry Disposition: Home, likely POD#3 or 4   LOS: 1 day    Madelaine Etienne MSIII 10/02/2019

## 2019-10-02 NOTE — Plan of Care (Signed)

## 2019-10-03 LAB — CBC WITH DIFFERENTIAL/PLATELET
Abs Immature Granulocytes: 0.02 10*3/uL (ref 0.00–0.07)
Basophils Absolute: 0 10*3/uL (ref 0.0–0.1)
Basophils Relative: 0 %
Eosinophils Absolute: 0.1 10*3/uL (ref 0.0–0.5)
Eosinophils Relative: 1 %
HCT: 48.7 % (ref 39.0–52.0)
Hemoglobin: 15.5 g/dL (ref 13.0–17.0)
Immature Granulocytes: 0 %
Lymphocytes Relative: 34 %
Lymphs Abs: 3.3 10*3/uL (ref 0.7–4.0)
MCH: 30.1 pg (ref 26.0–34.0)
MCHC: 31.8 g/dL (ref 30.0–36.0)
MCV: 94.6 fL (ref 80.0–100.0)
Monocytes Absolute: 0.7 10*3/uL (ref 0.1–1.0)
Monocytes Relative: 7 %
Neutro Abs: 5.4 10*3/uL (ref 1.7–7.7)
Neutrophils Relative %: 58 %
Platelets: 246 10*3/uL (ref 150–400)
RBC: 5.15 MIL/uL (ref 4.22–5.81)
RDW: 13 % (ref 11.5–15.5)
WBC: 9.5 10*3/uL (ref 4.0–10.5)
nRBC: 0 % (ref 0.0–0.2)

## 2019-10-03 LAB — MAGNESIUM: Magnesium: 2 mg/dL (ref 1.7–2.4)

## 2019-10-03 LAB — BASIC METABOLIC PANEL
Anion gap: 10 (ref 5–15)
BUN: 9 mg/dL (ref 6–20)
CO2: 27 mmol/L (ref 22–32)
Calcium: 8.4 mg/dL — ABNORMAL LOW (ref 8.9–10.3)
Chloride: 101 mmol/L (ref 98–111)
Creatinine, Ser: 0.91 mg/dL (ref 0.61–1.24)
GFR calc Af Amer: >60 mL/min (ref >60)
GFR calc non Af Amer: >60 mL/min (ref >60)
Glucose, Bld: 92 mg/dL (ref 70–99)
Potassium: 3.6 mmol/L (ref 3.5–5.1)
Sodium: 138 mmol/L (ref 135–145)

## 2019-10-03 LAB — PHOSPHORUS: Phosphorus: 2.8 mg/dL (ref 2.5–4.6)

## 2019-10-03 LAB — SURGICAL PATHOLOGY

## 2019-10-03 MED ORDER — BACITRACIN-NEOMYCIN-POLYMYXIN 400-5-5000 EX OINT
TOPICAL_OINTMENT | Freq: Two times a day (BID) | CUTANEOUS | Status: DC
  Filled 2019-10-03: qty 1

## 2019-10-03 MED ORDER — ALVIMOPAN 12 MG PO CAPS
12.0000 mg | ORAL_CAPSULE | Freq: Two times a day (BID) | ORAL | Status: AC
  Administered 2019-10-03 (×2): 12 mg via ORAL
  Filled 2019-10-03: qty 1

## 2019-10-03 MED ORDER — POTASSIUM CHLORIDE 20 MEQ PO PACK
40.0000 meq | PACK | Freq: Once | ORAL | Status: AC
  Administered 2019-10-03: 40 meq via ORAL
  Filled 2019-10-03: qty 2

## 2019-10-03 NOTE — Discharge Instructions (Signed)
Discharge Open Abdominal Surgery Instructions:  Common Complaints: Pain at the incision site is common. This will improve with time. Take your pain medications as described below. Some nausea is common and poor appetite. The main goal is to stay hydrated the first few days after surgery.   Diet/ Activity: Diet as tolerated. You have started and tolerated a diet in the hospital, and should continue to increase what you are able to eat.   You may not have a large appetite, but it is important to stay hydrated. Drink 64 ounces of water a day. Your appetite will return with time.  Keep a dry dressing in place over your staples daily or as needed. Some minor pink/ blood tinged drainage is expected. This will stop in a few days after surgery.  Shower per your regular routine daily.  Do not take hot showers. Take warm showers that are less than 10 minutes. Pat the incision dry. Wear an abdominal binder daily with activity. You do not have to wear this while sleeping or sitting.  Rest and listen to your body, but do not remain in bed all day.  Walk everyday for at least 15-20 minutes. Deep cough and move around every 1-2 hours in the first few days after surgery.  Do not lift > 10 lbs, perform excessive bending, pushing, pulling, squatting for 6-8 weeks after surgery.  The activity restrictions and the abdominal binder are to prevent hernia formation at your incision while you are healing.  Do not place lotions or balms on your incision unless instructed to specifically by Dr. Arleta Ostrum.   Pain Expectations and Narcotics: -After surgery you will have pain associated with your incisions and this is normal. The pain is muscular and nerve pain, and will get better with time. -You are encouraged and expected to take non narcotic medications like tylenol and ibuprofen (when able) to treat pain as multiple modalities can aid with pain treatment. -Narcotics are only used when pain is severe or there is  breakthrough pain. -You are not expected to have a pain score of 0 after surgery, as we cannot prevent pain. A pain score of 3-4 that allows you to be functional, move, walk, and tolerate some activity is the goal. The pain will continue to improve over the days after surgery and is dependent on your surgery. -Due to  law, we are only able to give a certain amount of pain medication to treat post operative pain, and we only give additional narcotics on a patient by patient basis.  -For most laparoscopic surgery, studies have shown that the majority of patients only need 10-15 narcotic pills, and for open surgeries most patients only need 15-20.   -Having appropriate expectations of pain and knowledge of pain management with non narcotics is important as we do not want anyone to become addicted to narcotic pain medication.  -Using ice packs in the first 48 hours and heating pads after 48 hours, wearing an abdominal binder (when recommended), and using over the counter medications are all ways to help with pain management.   -Simple acts like meditation and mindfulness practices after surgery can also help with pain control and research has proven the benefit of these practices.  Medication: Take tylenol and ibuprofen as needed for pain control, alternating every 4-6 hours.  Example:  Tylenol 1000mg @ 6am, 12noon, 6pm, 12midnight (Do not exceed 4000mg of tylenol a day). Ibuprofen 800mg @ 9am, 3pm, 9pm, 3am (Do not exceed 3600mg of ibuprofen a day).    Take Roxicodone for breakthrough pain every 4 hours.  Take Colace for constipation related to narcotic pain medication. If you do not have a bowel movement in 2 days, take Miralax over the counter.  Drink plenty of water to also prevent constipation.   Contact Information: If you have questions or concerns, please call our office, [phone], Monday- Thursday 8AM-5PM and Friday 8AM-12Noon.  If it is after hours or on the weekend, please call [facility]  Main Number, [phone], and ask to speak to the surgeon on call for Dr. Constance Haw at [facility].    Laparoscopic Partial Colectomy, Care After This sheet gives you information about how to care for yourself after your procedure. Your health care provider may also give you more specific instructions. If you have problems or questions, contact your health care provider. What can I expect after the procedure? After your procedure, it is common to have the following:  Pain in your abdomen, especially in the incision areas. You will be given medicine to control the pain.  Tiredness. This is a normal part of the recovery process. Your energy level will return to normal over the next several weeks.  Changes in your bowel movements, such as constipation or needing to go more often. Talk with your health care provider about how to manage this. Follow these instructions at home: Medicines  Take over-the-counter and prescription medicines only as told by your health care provider.  Do not drive or use heavy machinery while taking prescription pain medicine.  Do not drink alcohol while taking prescription pain medicine.  If you were prescribed an antibiotic medicine, use it as told by your health care provider. Do not stop using the antibiotic even if you start to feel better. Incision care   Follow instructions from your health care provider about how to take care of your incision areas. Make sure you: ? Keep your incisions clean and dry. ? Wash your hands with soap and water before and after applying medicine to the areas, and before and after changing your bandage (dressing). If soap and water are not available, use hand sanitizer. ? Change your dressing as told by your health care provider. ? Leave stitches (sutures), skin glue, or adhesive strips in place. These skin closures may need to stay in place for 2 weeks or longer. If adhesive strip edges start to loosen and curl up, you may trim the  loose edges. Do not remove adhesive strips completely unless your health care provider tells you to do that.  Do not wear tight clothing over the incisions. Tight clothing may rub and irritate the incision areas, which may cause the incisions to open.  Do not take baths, swim, or use a hot tub until your health care provider approves.   You may shower.  Check your incision area every day for signs of infection. Check for: ? More redness, swelling, or pain. ? More fluid or blood. ? Warmth. ? Pus or a bad smell. Activity  Avoid lifting anything that is heavier than 10 lb (4.5 kg) for 2 weeks or until your health care provider says it is okay.  You may resume normal activities as told by your health care provider. Ask your health care provider what activities are safe for you.  Take rest breaks during the day as needed. Eating and drinking  Follow instructions from your health care provider about what you can eat after surgery.  To prevent or treat constipation while you are taking prescription pain medicine,  your health care provider may recommend that you: ? Drink enough fluid to keep your urine clear or pale yellow. ? Take over-the-counter or prescription medicines. ? Eat foods that are high in fiber, such as fresh fruits and vegetables, whole grains, and beans. ? Limit foods that are high in fat and processed sugars, such as fried and sweet foods. General instructions  Ask your health care provider when you will need an appointment to get your sutures or staples removed.  Keep all follow-up visits as told by your health care provider. This is important. Contact a health care provider if:  You have more redness, swelling, or pain around your incisions.  You have more fluid or blood coming from the incisions.  Your incisions feel warm to the touch.  You have pus or a bad smell coming from your incisions or your dressing.  You have a fever.  You have an incision that  breaks open (edges not staying together) after sutures or staples have been removed. Get help right away if:  You develop a rash.  You have chest pain or difficulty breathing.  You have pain or swelling in your legs.  You feel light-headed or you faint.  Your abdomen swells (becomes distended).  You have nausea or vomiting.  You have blood in your stool (feces). This information is not intended to replace advice given to you by your health care provider. Make sure you discuss any questions you have with your health care provider. Document Released: 04/09/2005 Document Revised: 06/09/2018 Document Reviewed: 06/21/2016 Elsevier Patient Education  2020 [employer].

## 2019-10-03 NOTE — Plan of Care (Signed)
  Problem: Education: Goal: Knowledge of General Education information will improve Description Including pain rating scale, medication(s)/side effects and non-pharmacologic comfort measures Outcome: Progressing   Problem: Health Behavior/Discharge Planning: Goal: Ability to manage health-related needs will improve Outcome: Progressing   

## 2019-10-03 NOTE — Progress Notes (Signed)
2 Days Post-Op  Subjective: [name] is a very pleasant 38 year old gentleman POD#2 following partial sigmoid colectomy for recurrent diverticulitis.  Overall, he is doing well with no acute overnight events. He endorses improved central abdominal pain and improved "burning" pain over the incision, for which he received prn oxy x1 overnight. Pain does not radiate. He has been tolerating his liquid diet well. Endorses flatus and has had one bowel movement. Has urinated without discomfort. Has been able to ambulate unassisted and without pain since removal of the foley catheter. Denies any chest pain, shortness of breath, chills, night sweats, or feeling faint or feverish.    Objective: Vital signs in last 24 hours: Temp:  [97.8 F (36.6 C)-98.5 F (36.9 C)] 97.8 F (36.6 C) (12/30 0427) Pulse Rate:  [58-71] 58 (12/30 0427) Resp:  [20] 20 (12/30 0427) BP: (125-140)/(79-94) 140/94 (12/30 0427) SpO2:  [91 %-95 %] 91 % (12/30 0427) Last BM Date: 09/30/19  Intake/Output from previous day: 12/29 0701 - 12/30 0700 In: 1320 [P.O.:1320] Out: 800 [Urine:800] Intake/Output this shift: No intake/output data recorded.   Physical Exam:  General: Well appearing, in no acute distress HEENT: No cervical or supraclavicular lymphadenopathy Cardio: Normal S1, S2, no murmurs, rubs or gallops appreciated Pulmonary: Lungs CTAB, pt breathing comfortably on RA Extremities: No LE edema, SCDs removed Abdominal: Normoactive bowel sounds, abdomen soft and non-tender to palpation with no rebound tenderness or guarding Skin/Incisions: Honeycomb dressing in place over central incision-- not removed for this exam, but appears to be healing well, staples in place. No warmth, no point tenderness or erythema. No point tenderness or warmth over lateral trochanter sites.  Lab Results:  Recent Labs    10/02/19 0450 10/03/19 0652  WBC 15.5* 9.5  HGB 16.6 15.5  HCT 51.4 48.7  PLT 279 246   BMET Recent Labs     10/02/19 0450 10/03/19 0652  NA 137 138  K 4.3 3.6  CL 102 101  CO2 24 27  GLUCOSE 112* 92  BUN 9 9  CREATININE 0.86 0.91  CALCIUM 8.7* 8.4*   PT/INR No results for input(s): LABPROT, INR in the last 72 hours.  Studies/Results: No results found.  Anti-infectives: Anti-infectives (From admission, onward)   Start     Dose/Rate Route Frequency Ordered Stop   10/01/19 2200  cefoTEtan (CEFOTAN) 2 g in sodium chloride 0.9 % 100 mL IVPB     2 g 200 mL/hr over 30 Minutes Intravenous Every 12 hours 10/01/19 1411 10/03/19 2159   10/01/19 0745  cefoTEtan (CEFOTAN) 2 g in sodium chloride 0.9 % 100 mL IVPB     2 g 200 mL/hr over 30 Minutes Intravenous On call to O.R. 10/01/19 0742 10/01/19 0925      Assessment/Plan:  [name] is a very pleasant 38 year old gentleman POD#2 following partial sigmoid colectomy for recurrent diverticulitis, recovering well.  Neuro: Improving abdominal pain, with more infrequent requirement of prns than yesterday. Keep prns onboard in addition to scheduled tylenol. Continue scheduled gabapentin and home fluoxetine.  Pulm: Incentive spirometry GI: Okay to advance to soft foods as pt had BM. Continue entereg and pantoprazole  GU: Foley D/C'd, pt making good urine without discomfort. K, Mag, Phos all wnl. Heme: Hgb decreased slightly since POD#1 to 15.5, likely dilutional ID: White count normalized. Continue IV cefotan.  Prophylaxis: Continue to encourage ambulation, Lovenox daily (switched from heparin) Disposition: Home, likely tomorrow or POD#4   LOS: 2 days    Maryiah Olvey F  Denilson Salminen MSIII 10/03/2019

## 2019-10-04 LAB — CBC WITH DIFFERENTIAL/PLATELET
Abs Immature Granulocytes: 0.02 10*3/uL (ref 0.00–0.07)
Basophils Absolute: 0.1 10*3/uL (ref 0.0–0.1)
Basophils Relative: 1 %
Eosinophils Absolute: 0.2 10*3/uL (ref 0.0–0.5)
Eosinophils Relative: 2 %
HCT: 49.6 % (ref 39.0–52.0)
Hemoglobin: 15.7 g/dL (ref 13.0–17.0)
Immature Granulocytes: 0 %
Lymphocytes Relative: 38 %
Lymphs Abs: 3 10*3/uL (ref 0.7–4.0)
MCH: 29.9 pg (ref 26.0–34.0)
MCHC: 31.7 g/dL (ref 30.0–36.0)
MCV: 94.5 fL (ref 80.0–100.0)
Monocytes Absolute: 0.7 10*3/uL (ref 0.1–1.0)
Monocytes Relative: 9 %
Neutro Abs: 3.9 10*3/uL (ref 1.7–7.7)
Neutrophils Relative %: 50 %
Platelets: 270 10*3/uL (ref 150–400)
RBC: 5.25 MIL/uL (ref 4.22–5.81)
RDW: 12.9 % (ref 11.5–15.5)
WBC: 7.8 10*3/uL (ref 4.0–10.5)
nRBC: 0 % (ref 0.0–0.2)

## 2019-10-04 LAB — BASIC METABOLIC PANEL
Anion gap: 12 (ref 5–15)
BUN: 9 mg/dL (ref 6–20)
CO2: 30 mmol/L (ref 22–32)
Calcium: 8.8 mg/dL — ABNORMAL LOW (ref 8.9–10.3)
Chloride: 96 mmol/L — ABNORMAL LOW (ref 98–111)
Creatinine, Ser: 1.01 mg/dL (ref 0.61–1.24)
GFR calc Af Amer: >60 mL/min (ref >60)
GFR calc non Af Amer: >60 mL/min (ref >60)
Glucose, Bld: 98 mg/dL (ref 70–99)
Potassium: 3.8 mmol/L (ref 3.5–5.1)
Sodium: 138 mmol/L (ref 135–145)

## 2019-10-04 LAB — MAGNESIUM: Magnesium: 2.2 mg/dL (ref 1.7–2.4)

## 2019-10-04 LAB — PHOSPHORUS: Phosphorus: 4.2 mg/dL (ref 2.5–4.6)

## 2019-10-04 MED ORDER — OXYCODONE-ACETAMINOPHEN 7.5-325 MG PO TABS: 1.0000 | ORAL_TABLET | Freq: Four times a day (QID) | ORAL | 0 refills | Status: DC | PRN

## 2019-10-04 NOTE — Discharge Summary (Signed)
Physician Discharge Summary  Patient ID: Juan Lopez MRN: [id number] DOB/AGE: 1980-10-12 38 y.o.  Admit date: 10/01/2019 Discharge date: 10/04/2019  Admission Diagnoses: Sigmoid diverticulitis  Discharge Diagnoses: Same Principal Problem:   Acute diverticulitis Active Problems:   Sigmoid diverticulitis   Discharged Condition: good  Hospital Course: Patient is a 38 year old white male with recurrent episodes of sigmoid diverticulitis who underwent a laparoscopic partial colectomy on 10/01/2019.  He tolerated the surgery well.  His postoperative course has been unremarkable.  His diet was advanced without difficulty.  The patient being discharged home on 10/04/2019 in good and improving condition.  Treatments: surgery: Laparoscopic partial colectomy on 10/01/2019  Discharge Exam: Blood pressure (!) 131/93, pulse 63, temperature 97.6 F (36.4 C), temperature source Oral, resp. rate 18, height 6' (1.829 m), weight 118.7 kg, SpO2 94 %. General appearance: alert, cooperative and no distress Resp: clear to auscultation bilaterally Cardio: regular rate and rhythm, S1, S2 normal, no murmur, click, rub or gallop GI: Soft, incisions healing well.  Bowel sounds active.  Disposition: Discharge disposition: 01-Home or Self Care       Discharge Instructions    Diet general   Complete by: As directed    Increase activity slowly   Complete by: As directed      Allergies as of 10/04/2019      Reactions   Bee Venom Nausea And Vomiting      Medication List    TAKE these medications   acetaminophen 325 MG tablet Commonly known as: TYLENOL Take 650 mg by mouth every 6 (six) hours as needed for moderate pain or headache.   FLUoxetine 20 MG capsule Commonly known as: PROZAC Take 20 mg by mouth daily.   nitroGLYCERIN 0.4 MG SL tablet Commonly known as: NITROSTAT Place 1 tablet (0.4 mg total) under the tongue every 5 (five) minutes x 3 doses as needed for chest pain.    oxyCODONE-acetaminophen 7.5-325 MG tablet Commonly known as: Percocet Take 1 tablet by mouth every 6 (six) hours as needed.      Follow-up Information    Virl Cagey, MD Follow up on 10/11/2019.   Specialty: General Surgery Why: staple removal Contact information: [address] [facility] [address] [phone]           Signed: Aviva Signs 10/04/2019, 9:16 AM

## 2019-10-04 NOTE — Progress Notes (Signed)
Nsg Discharge Note  Admit Date:  10/01/2019 Discharge date: 10/04/2019   [name] to be D/C'd Home per MD order.  AVS completed.  Copy for chart, and copy for patient signed, and dated. Removed IV-clean, dry, intact. Reviewed d/c paperwork with patient and wife. Answered all questions. Walked stable patient, wife,  and belongings to elevator where he walked out with his wife to d/c to home. Patient/caregiver able to verbalize understanding.  Discharge Medication: Allergies as of 10/04/2019      Reactions   Bee Venom Nausea And Vomiting      Medication List    TAKE these medications   acetaminophen 325 MG tablet Commonly known as: TYLENOL Take 650 mg by mouth every 6 (six) hours as needed for moderate pain or headache.   FLUoxetine 20 MG capsule Commonly known as: PROZAC Take 20 mg by mouth daily.   nitroGLYCERIN 0.4 MG SL tablet Commonly known as: NITROSTAT Place 1 tablet (0.4 mg total) under the tongue every 5 (five) minutes x 3 doses as needed for chest pain.   oxyCODONE-acetaminophen 7.5-325 MG tablet Commonly known as: Percocet Take 1 tablet by mouth every 6 (six) hours as needed.       Discharge Assessment: Vitals:   10/03/19 2118 10/04/19 0447  BP: 128/79 (!) 131/93  Pulse: 72 63  Resp: 18 18  Temp: 97.8 F (36.6 C) 97.6 F (36.4 C)  SpO2: 96% 94%   Skin clean, dry and intact without evidence of skin break down, no evidence of skin tears noted. IV catheter discontinued intact. Site without signs and symptoms of complications - no redness or edema noted at insertion site, patient denies c/o pain - only slight tenderness at site.  Dressing with slight pressure applied.  D/c Instructions-Education: Discharge instructions given to patient/family with verbalized understanding. D/c education completed with patient/family including follow up instructions, medication list, d/c activities limitations if indicated, with other d/c instructions as indicated by MD  - patient able to verbalize understanding, all questions fully answered. Patient instructed to return to ED, call 911, or call MD for any changes in condition.  Patient escorted via [place], and D/C home via private auto.  Santa Lighter, RN 10/04/2019 11:22 AM

## 2019-10-04 NOTE — Plan of Care (Signed)

## 2019-10-04 NOTE — Progress Notes (Signed)
Patient ambulated to the patients elevators and back to his room last night and this morning.

## 2019-10-08 ENCOUNTER — Telehealth: Payer: Self-pay | Admitting: General Surgery

## 2019-10-08 NOTE — Telephone Encounter (Signed)
[facility]  Called to check on patient. Still sore but overall he is well. Having daily Bms and eating ok. Feels pulling. He says he is taking some narcotics but he is not taking them every 4 hours. He says he is stretching it out.   Curlene Labrum, MD [facility] [address] [place], [place] [address] [phone] (office)

## 2019-10-11 ENCOUNTER — Ambulatory Visit (INDEPENDENT_AMBULATORY_CARE_PROVIDER_SITE_OTHER): Payer: Self-pay | Admitting: General Surgery

## 2019-10-11 ENCOUNTER — Encounter: Payer: Self-pay | Admitting: General Surgery

## 2019-10-11 ENCOUNTER — Other Ambulatory Visit: Payer: Self-pay

## 2019-10-11 VITALS — BP 123/84 | HR 83 | Temp 98.6°F | Resp 16 | Ht 72.0 in | Wt 253.2 lb

## 2019-10-11 DIAGNOSIS — K5732 Diverticulitis of large intestine without perforation or abscess without bleeding: Secondary | ICD-10-CM

## 2019-10-11 NOTE — Progress Notes (Signed)
[facility] Note   HPI:  39 y.o. Male presents to clinic for post-op follow-up evaluation after laparoscopic partial colectomy. He is doing well. He is having regular BMs and eating a diet. He says he has soreness in his lower abdomen but is able to take minimal pain medication.   Review of Systems:  No fever or chills Regular BMs Localized redness at staple sites  All other review of systems: otherwise negative   Pathology: SURGICAL PATHOLOGY  CASE: [id number]  PATIENT: Juan Lopez  Surgical Pathology Report   Clinical History: diverticulitis   FINAL MICROSCOPIC DIAGNOSIS:   A. COLON, SIGMOID, RESECTION:  - Segments of colon (3.5 and 11 cm) with diverticulosis  - Subserosal acellular mucin pool and associated foreign body giant cell  reaction  - Margins appear viable   Vital Signs:  BP 123/84   Pulse 83   Temp 98.6 F (37 C) (Oral)   Resp 16   Ht 6' (1.829 m)   Wt 253 lb 3.2 oz (114.9 kg)   SpO2 97%   BMI 34.34 kg/m    Physical Exam:  Physical Exam Vitals reviewed.  HENT:     Head: Normocephalic.  Cardiovascular:     Rate and Rhythm: Normal rate.  Pulmonary:     Effort: Pulmonary effort is normal.  Abdominal:     General: There is no distension.     Palpations: Abdomen is soft.     Tenderness: There is no abdominal tenderness.     Hernia: No hernia is present.     Comments: Staples removed, midline and port sites, staple site with punctate redness, no extending redness or sign of infection, upper trocar site with burnt scab from cautery burn from bleeding  Neurological:     Mental Status: He is alert.     Assessment:  39 y.o. yo Male s/p laparoscopic partial colectomy for diverticulitis. Doing well and improving overall.   Plan:  - Keep stools soft and regular  - Diet advance slowly as tolerated  - No lifting > 10 lbs, excessive bending, pushing, pulling, squatting for the next 6-8 weeks after surgery - Will see prior to  going back to work to release    Future Appointments  Date Time Provider [place]  11/20/2019 10:15 AM Virl Cagey, MD [facility] None     All of the above recommendations were discussed with the patient, and all of patient's questions were answered to his expressed satisfaction.  Curlene Labrum, MD [facility] [address] [place], [place] [address] [phone] (office)

## 2019-10-18 ENCOUNTER — Encounter: Payer: Self-pay | Admitting: General Surgery

## 2019-10-18 ENCOUNTER — Other Ambulatory Visit: Payer: Self-pay

## 2019-10-18 ENCOUNTER — Ambulatory Visit (INDEPENDENT_AMBULATORY_CARE_PROVIDER_SITE_OTHER): Payer: Self-pay | Admitting: General Surgery

## 2019-10-18 VITALS — BP 123/87 | HR 89 | Temp 98.3°F | Resp 16 | Ht 72.0 in | Wt 260.0 lb

## 2019-10-18 DIAGNOSIS — T8131XA Disruption of external operation (surgical) wound, not elsewhere classified, initial encounter: Secondary | ICD-10-CM

## 2019-10-18 DIAGNOSIS — K5732 Diverticulitis of large intestine without perforation or abscess without bleeding: Secondary | ICD-10-CM

## 2019-10-18 NOTE — Progress Notes (Signed)
[facility] Note   HPI:  39 y.o. Male presents to clinic for follow up. His right sided port site opened up.   Review of Systems:  Minor bleeding from incision, opened up All other review of systems: otherwise negative   Vital Signs:  BP 123/87 (BP Location: Left Arm, Patient Position: Sitting, Cuff Size: Large)   Pulse 89   Temp 98.3 F (36.8 C) (Oral)   Resp 16   Ht 6' (1.829 m)   Wt 260 lb (117.9 kg)   SpO2 97%   BMI 35.26 kg/m    Physical Exam:  Physical Exam Vitals reviewed.  HENT:     Head: Normocephalic.  Cardiovascular:     Rate and Rhythm: Normal rate.  Pulmonary:     Effort: Pulmonary effort is normal.  Abdominal:     General: There is no distension.     Palpations: Abdomen is soft.     Tenderness: There is no abdominal tenderness.     Comments: Right sided port site with burnt eschar from cautery use, opened slightly, no expressed drainage, no surrounding erythema, remaining incisions healing   Neurological:     Mental Status: He is alert.     Assessment:  39 y.o. yo Male with right port site that had bleeding during surgery and required more cauterization. He has a burnt eschar in there now and the incision opened up/ slight dehisced. It is superficial and there is no infection noted.   Plan:  - Watch for extending erythema or purulent drainage  - Ok to do neosporin to area and cover with dry bandage - Call with concerns    Curlene Labrum, MD [facility] [address] [place], [place] [address] [phone] (office)

## 2019-11-20 ENCOUNTER — Ambulatory Visit (INDEPENDENT_AMBULATORY_CARE_PROVIDER_SITE_OTHER): Payer: Self-pay | Admitting: General Surgery

## 2019-11-20 VITALS — BP 136/86 | HR 100 | Wt 268.2 lb

## 2019-11-20 DIAGNOSIS — K5732 Diverticulitis of large intestine without perforation or abscess without bleeding: Secondary | ICD-10-CM

## 2019-11-21 ENCOUNTER — Encounter: Payer: Self-pay | Admitting: General Surgery

## 2019-11-21 NOTE — Patient Instructions (Signed)
Patient declined, internet down during visit

## 2019-11-21 NOTE — Progress Notes (Signed)
[facility] Note   HPI:  39 y.o. Male presents to clinic for post-op follow-up evaluation after laparoscopic partial colectomy 10/01/2019. He has been doing well and having bowel function and tolerating a diet. His right sided port site remains open. He has been placing neosporin to the area.  Review of Systems:  No fever or chills Tolerating diet BMs daily All other review of systems: otherwise negative   Vital Signs:  BP 136/86   Pulse 100   Wt 268 lb 3.2 oz (121.7 kg)   SpO2 96%   BMI 36.37 kg/m    Physical Exam:  Physical Exam Vitals reviewed.  Constitutional:      Appearance: He is normal weight.  HENT:     Head: Normocephalic.     Nose: Nose normal.     Mouth/Throat:     Mouth: Mucous membranes are moist.  Eyes:     Pupils: Pupils are equal, round, and reactive to light.  Cardiovascular:     Rate and Rhythm: Normal rate.  Pulmonary:     Effort: Pulmonary effort is normal.  Abdominal:     General: There is no distension.     Palpations: Abdomen is soft.     Tenderness: There is no abdominal tenderness.     Hernia: No hernia is present.     Comments: Right sided port site open, 1cm and about 0.5cm deep, granulation with rolled epithelized edges, silver nitrate applied, no erythema, minimal yellowish drainage, packing to area   Musculoskeletal:        General: No swelling. Normal range of motion.  Skin:    General: Skin is warm and dry.  Neurological:     Mental Status: He is alert.     Assessment:  39 y.o. yo Male with post op from a laparoscopic partial colectomy for diverticulitis. Doing well. His one port site did not close after this area was cauterized due to bleeding.  It remains open. There are no signs of infection but because of the epithelization it has not closed. Silver nitrate should help and packing. Patient to call if not getting more superficial by 11/29/2019 and we can do a minor excision and closure in the office. He is ok to  work with this and keep the area covered as it is superficial.   Plan:  - Return to work 11/26/2019 without restrictions - Patient will keep [place] updated on the superficial wound - May have to ellipse out the skin and close it if it does not close in the next 2 weeks  - Diet and activity as tolerated   All of the above recommendations were discussed with the patient, and all of patient's questions were answered to his expressed satisfaction.  Curlene Labrum, MD [facility] [address] [place], [place] [address] [phone] (office)

## 2019-11-29 ENCOUNTER — Encounter: Payer: Self-pay | Admitting: General Surgery

## 2019-11-29 ENCOUNTER — Ambulatory Visit (INDEPENDENT_AMBULATORY_CARE_PROVIDER_SITE_OTHER): Payer: Self-pay | Admitting: General Surgery

## 2019-11-29 ENCOUNTER — Emergency Department (HOSPITAL_COMMUNITY)
Admission: EM | Admit: 2019-11-29 | Discharge: 2019-11-29 | Disposition: A | Discharge status: Home / Self Care | Payer: 59 | Attending: Emergency Medicine | Admitting: Emergency Medicine

## 2019-11-29 ENCOUNTER — Encounter (HOSPITAL_COMMUNITY): Payer: Self-pay | Admitting: Emergency Medicine

## 2019-11-29 ENCOUNTER — Other Ambulatory Visit: Payer: Self-pay

## 2019-11-29 VITALS — BP 135/86 | HR 113 | Temp 98.3°F | Resp 12 | Ht 72.0 in | Wt 266.0 lb

## 2019-11-29 DIAGNOSIS — F17210 Nicotine dependence, cigarettes, uncomplicated: Secondary | ICD-10-CM | POA: Insufficient documentation

## 2019-11-29 DIAGNOSIS — I10 Essential (primary) hypertension: Secondary | ICD-10-CM | POA: Insufficient documentation

## 2019-11-29 DIAGNOSIS — Z79899 Other long term (current) drug therapy: Secondary | ICD-10-CM | POA: Diagnosis not present

## 2019-11-29 DIAGNOSIS — I2510 Atherosclerotic heart disease of native coronary artery without angina pectoris: Secondary | ICD-10-CM | POA: Insufficient documentation

## 2019-11-29 DIAGNOSIS — L7682 Other postprocedural complications of skin and subcutaneous tissue: Secondary | ICD-10-CM | POA: Insufficient documentation

## 2019-11-29 DIAGNOSIS — S31109A Unspecified open wound of abdominal wall, unspecified quadrant without penetration into peritoneal cavity, initial encounter: Secondary | ICD-10-CM

## 2019-11-29 DIAGNOSIS — T8131XA Disruption of external operation (surgical) wound, not elsewhere classified, initial encounter: Secondary | ICD-10-CM

## 2019-11-29 NOTE — Discharge Instructions (Signed)
Return if any problems.

## 2019-11-29 NOTE — ED Provider Notes (Signed)
[facility] Provider Note   CSN: [id number] Arrival date & time: 11/29/19  1706     History Chief Complaint  Patient presents with  . Bleeding/Bruising    KOLEMAN Lopez is a 39 y.o. male.  Pt reports he had a scar revised today by Dr. Constance Haw.  Pt reports area was not healing.  Pt reports area has been bleeding since he left the office   The history is provided by the patient. No language interpreter was used.       Past Medical History:  Diagnosis Date  . Acute myopericarditis    a. dx 10/2016  . Anxiety   . CAD (coronary artery disease)    a. LHC 10/2016 with mild luminal irregularities. Placed on statin   . Diverticulitis   . Family history of early CAD   . HTN (hypertension)     Patient Active Problem List   Diagnosis Date Noted  . Sigmoid diverticulitis 10/01/2019  . Diarrhea 08/07/2019  . Recurrent acute sigmoid diverticulitis 08/07/2019  . Obesity (BMI 30-39.9) 08/07/2019  . Acute diverticulitis 08/07/2019  . Abdominal pain 07/31/2019  . Diverticulitis large intestine w/o perforation or abscess w/o bleeding 08/16/2018  . RUQ pain 08/16/2018  . Fatty liver 08/16/2018  . Change in bowel habits 11/02/2017  . Acute myopericarditis 10/16/2016  . Essential hypertension 10/16/2016  . Family history of early CAD 10/16/2016    Past Surgical History:  Procedure Laterality Date  . CARDIAC CATHETERIZATION [name] 10/16/2016   Procedure: Left Heart Cath and Coronary Angiography;  Surgeon: Peter M Martinique, MD;  Location: [facility];  Service: Cardiovascular;  Laterality: [name];  . COLONOSCOPY [name] 11/14/2017   Dr. Oneida Alar: 3 mm polyp from proximal ascending colon removed, rectosigmoid colon and sigmoid colon diverticula, external and internal hemorrhoids, random colon biopsies were benign.  Marland Kitchen LAPAROSCOPIC PARTIAL COLECTOMY [name] 10/01/2019   Procedure: LAPAROSCOPIC PARTIAL COLECTOMY;  Surgeon: Virl Cagey, MD;  Location: [facility];  Service:  General;  Laterality: [name];  . POLYPECTOMY  11/14/2017   Procedure: POLYPECTOMY;  Surgeon: Danie Binder, MD;  Location: [facility];  Service: Endoscopy;;  Ascending colon (CS)  . TONSILLECTOMY    . WISDOM TOOTH EXTRACTION     AGE 58-25       Family History  Problem Relation Age of Onset  . Diabetes Mother   . Heart failure Mother   . Colon polyps Mother        REQUIRED COLECTOMY  . Heart failure Father   . Heart attack Maternal Grandmother   . Pneumonia Maternal Grandfather   . Diabetes Paternal Grandmother   . Obesity Paternal Grandmother   . Heart attack Paternal Grandfather   . Colon cancer Neg Hx     Social History   Tobacco Use  . Smoking status: Current Some Day Smoker    Packs/day: 0.50    Years: 18.00    Pack years: 9.00  . Smokeless tobacco: Never Used  . Tobacco comment: Only smokes when at work; doesn't smoke at home around children  Substance Use Topics  . Alcohol use: Not Currently    Comment: rare  . Drug use: No    Home Medications Prior to Admission medications   Medication Sig Start Date End Date Taking? Authorizing Provider  acetaminophen (TYLENOL) 325 MG tablet Take 650 mg by mouth every 6 (six) hours as needed for moderate pain or headache.    Yes [provider]  FLUoxetine (PROZAC) 20  MG capsule Take 20 mg by mouth every evening.  08/16/19  Yes [provider]  nitroGLYCERIN (NITROSTAT) 0.4 MG SL tablet Place 1 tablet (0.4 mg total) under the tongue every 5 (five) minutes x 3 doses as needed for chest pain. 10/17/16  Yes Eileen Stanford, PA-C    Allergies    Bee venom  Review of Systems   Review of Systems  All other systems reviewed and are negative.   Physical Exam Updated Vital Signs BP (!) 137/94 (BP Location: Right Arm)   Pulse (!) 110   Resp 16   Ht 6' (1.829 m)   Wt 119.3 kg   SpO2 100%   BMI 35.67 kg/m   Physical Exam Vitals reviewed.  Cardiovascular:     Rate and Rhythm: Normal rate.    Pulmonary:     Effort: Pulmonary effort is normal.  Abdominal:     General: Abdomen is flat. Bowel sounds are normal.     Comments: Sutured incision. steristrips on top,  No active bleeding   Musculoskeletal:        General: Normal range of motion.  Skin:    General: Skin is warm.  Neurological:     General: No focal deficit present.  Psychiatric:        Mood and Affect: Mood normal.     ED Results / Procedures / Treatments   Labs (all labs ordered are listed, but only abnormal results are displayed) Labs Reviewed - No data to display  EKG None  Radiology No results found.  Procedures Procedures (including critical care time)  Medications Ordered in ED Medications - No data to display  ED Course  I have reviewed the triage vital signs and the nursing notes.  Pertinent labs & imaging results that were available during my care of the patient were reviewed by me and considered in my medical decision making (see chart for details).    MDM Rules/Calculators/A&P                      MDM  Pressure dressing applied  Final Clinical Impression(s) / ED Diagnoses Final diagnoses:  Open wound of lateral abdominal wall with complication, initial encounter    Rx / DC Orders ED Discharge Orders    None    An After Visit Summary was printed and given to the patient.    Juan Lopez 11/29/19 1955    Fredia Sorrow, MD 11/30/19 [phone]

## 2019-11-29 NOTE — ED Triage Notes (Signed)
Patient had a wound reopened and stitched at 3 pm today. Bleeding through stitches now.

## 2019-12-01 DIAGNOSIS — T8131XA Disruption of external operation (surgical) wound, not elsewhere classified, initial encounter: Secondary | ICD-10-CM | POA: Insufficient documentation

## 2019-12-01 NOTE — Progress Notes (Signed)
[facility] Note   HPI:  39 y.o. Male presents to clinic for follow-up evaluation of his right port site that he has been packing. The area is not closing. We had discussed excising that skin and closure given that the area opened up after surgery.   Review of Systems:  No drainage No redness No fever or chills All other review of systems: otherwise negative   Vital Signs:  BP 135/86   Pulse (!) 113   Temp 98.3 F (36.8 C) (Oral)   Resp 12   Ht 6' (1.829 m)   Wt 266 lb (120.7 kg)   SpO2 96%   BMI 36.08 kg/m    Physical Exam:  Physical Exam Vitals reviewed.  HENT:     Head: Normocephalic.  Cardiovascular:     Rate and Rhythm: Normal rate.  Pulmonary:     Effort: Pulmonary effort is normal.  Abdominal:     Comments: Right port site open, about 1cm deep with granulation, no erythema or drainage  Neurological:     Mental Status: He is alert.      Procedure: Revision of port site scar  Description: The area was anesthetized with 1% lidocaine. The area was prepped and draped in the usual fashion. An elliptical incision was made around the port site and carried down to healthy fat. Hemostasis was obtained with pressure and electrocautery. The site was closed with 3-0 vicryl interrupted and a 4-0 running Monocryl suture. The skin was covered with a steri strip after benzoin was placed on the surrounding skin. A dry sterile dressing was placed.   Assessment:  39 y.o. yo Male with a port site that opened up and failed to heal.  We discussed continued dressing changes versus revision of the area. Discussed the risk of bleeding and infection.  He wants to proceed with revision and this was completed in the office (see above).   Plan:  - Call with issues - No bathing in tube until healed ~ 4 weeks - Do not pull steri strips until peeling off   All of the above recommendations were discussed with the patient, and all of patient's questions were answered to his  expressed satisfaction.  Curlene Labrum, MD [facility] [address] [place], [place] [address] [phone] (office)

## 2020-01-15 ENCOUNTER — Encounter: Payer: Self-pay | Admitting: Gastroenterology

## 2020-02-08 ENCOUNTER — Telehealth: Payer: Self-pay | Admitting: Cardiology

## 2020-02-08 NOTE — Telephone Encounter (Signed)
  Patient Consent for Virtual Visit         Juan Lopez has provided verbal consent on 02/08/2020 for a virtual visit (video or telephone).   CONSENT FOR VIRTUAL VISIT FOR:  Juan Lopez  By participating in this virtual visit I agree to the following:  I hereby voluntarily request, consent and authorize [facility] and its employed or contracted physicians, physician assistants, nurse practitioners or other licensed health care professionals (the Practitioner), to provide me with telemedicine health care services (the "Services") as deemed necessary by the treating Practitioner. I acknowledge and consent to receive the Services by the Practitioner via telemedicine. I understand that the telemedicine visit will involve communicating with the Practitioner through live audiovisual communication technology and the disclosure of certain medical information by electronic transmission. I acknowledge that I have been given the opportunity to request an in-person assessment or other available alternative prior to the telemedicine visit and am voluntarily participating in the telemedicine visit.  I understand that I have the right to withhold or withdraw my consent to the use of telemedicine in the course of my care at any time, without affecting my right to future care or treatment, and that the Practitioner or I may terminate the telemedicine visit at any time. I understand that I have the right to inspect all information obtained and/or recorded in the course of the telemedicine visit and may receive copies of available information for a reasonable fee.  I understand that some of the potential risks of receiving the Services via telemedicine include:  Marland Kitchen Delay or interruption in medical evaluation due to technological equipment failure or disruption; . Information transmitted may not be sufficient (e.g. poor resolution of images) to allow for appropriate medical decision making by the  Practitioner; and/or  . In rare instances, security protocols could fail, causing a breach of personal health information.  Furthermore, I acknowledge that it is my responsibility to provide information about my medical history, conditions and care that is complete and accurate to the best of my ability. I acknowledge that Practitioner's advice, recommendations, and/or decision may be based on factors not within their control, such as incomplete or inaccurate data provided by me or distortions of diagnostic images or specimens that may result from electronic transmissions. I understand that the practice of medicine is not an exact science and that Practitioner makes no warranties or guarantees regarding treatment outcomes. I acknowledge that a copy of this consent can be made available to me via my patient portal ([facility]), or I can request a printed copy by calling the office of [facility].    I understand that my insurance will be billed for this visit.   I have read or had this consent read to me. . I understand the contents of this consent, which adequately explains the benefits and risks of the Services being provided via telemedicine.  . I have been provided ample opportunity to ask questions regarding this consent and the Services and have had my questions answered to my satisfaction. . I give my informed consent for the services to be provided through the use of telemedicine in my medical care

## 2020-02-13 ENCOUNTER — Telehealth (INDEPENDENT_AMBULATORY_CARE_PROVIDER_SITE_OTHER): Payer: 59 | Admitting: Cardiology

## 2020-02-13 ENCOUNTER — Encounter: Payer: Self-pay | Admitting: Cardiology

## 2020-02-13 VITALS — BP 137/76 | HR 94 | Ht 72.0 in | Wt 258.0 lb

## 2020-02-13 DIAGNOSIS — R002 Palpitations: Secondary | ICD-10-CM | POA: Diagnosis not present

## 2020-02-13 DIAGNOSIS — I10 Essential (primary) hypertension: Secondary | ICD-10-CM | POA: Diagnosis not present

## 2020-02-13 NOTE — Progress Notes (Signed)
Virtual Visit via Telephone Note   This visit type was conducted due to national recommendations for restrictions regarding the COVID-19 Pandemic (e.g. social distancing) in an effort to limit this patient's exposure and mitigate transmission in our community.  Due to his co-morbid illnesses, this patient is at least at moderate risk for complications without adequate follow up.  This format is felt to be most appropriate for this patient at this time.  The patient did not have access to video technology/had technical difficulties with video requiring transitioning to audio format only (telephone).  All issues noted in this document were discussed and addressed.  No physical exam could be performed with this format.  Please refer to the patient's chart for his  consent to telehealth for [facility].   The patient was identified using 2 identifiers.  Date:  02/13/2020   ID:  Juan Lopez, DOB 12/22/1980, MRN [id number]  Patient Location: Home Provider Location: Office  PCP:  Redmond School, MD  Cardiologist:  Dr Harl Bowie Electrophysiologist:  None   Evaluation Performed:  Follow-Up Visit  Chief Complaint:  Follow up  History of Present Illness:    Juan Lopez is a 39 y.o. male seen today for follow up of the following medical problems.    1. History of myopericarditis - admission Jan 2018 with chest pain. Troponin up to 2.4, EKG suspicious for pericarditis - cath showed no significant CAD - discharged on ibuprofen 400mg  tid x 2 weeks, and colchcine 0.6mg  bid x 3 months.  - echo Jan 2018 LVEF 55-60%, no WMAs, no effusion.   - no recent chest pains   2. Palpitations - we changed lopressor to dilt due to fatigue previously - pcp stopped diltiazem, lost significant weight and bp's normalized and palpitations resolved. -  No recent symptoms   3. HTN - off lisinopril and diltiazem, bp's had improved with significant weight loss - home bps 130s/80s   SH:  works at [employer] Has completed covid vaccine   The patient does not have symptoms concerning for COVID-19 infection (fever, chills, cough, or new shortness of breath).    Past Medical History:  Diagnosis Date  . Acute myopericarditis    a. dx 10/2016  . Anxiety   . CAD (coronary artery disease)    a. LHC 10/2016 with mild luminal irregularities. Placed on statin   . Diverticulitis   . Family history of early CAD   . HTN (hypertension)    Past Surgical History:  Procedure Laterality Date  . CARDIAC CATHETERIZATION [name] 10/16/2016   Procedure: Left Heart Cath and Coronary Angiography;  Surgeon: Peter M Martinique, MD;  Location: [place] [facility];  Service: Cardiovascular;  Laterality: [name];  . COLONOSCOPY [name] 11/14/2017   Dr. Oneida Alar: 3 mm polyp from proximal ascending colon removed, rectosigmoid colon and sigmoid colon diverticula, external and internal hemorrhoids, random colon biopsies were benign.  Marland Kitchen LAPAROSCOPIC PARTIAL COLECTOMY [name] 10/01/2019   Procedure: LAPAROSCOPIC PARTIAL COLECTOMY;  Surgeon: Virl Cagey, MD;  Location: [facility];  Service: General;  Laterality: [name];  . POLYPECTOMY  11/14/2017   Procedure: POLYPECTOMY;  Surgeon: Danie Binder, MD;  [facility];  Service: Endoscopy;;  Ascending colon (CS)  . TONSILLECTOMY    . WISDOM TOOTH EXTRACTION     AGE [age]     No outpatient medications have been marked as taking for the 02/13/20 encounter (Appointment) with Arnoldo Lenis, MD.     Allergies:   Bee venom  Social History   Tobacco Use  . Smoking status: Current Some Day Smoker    Packs/day: 0.50    Years: 18.00    Pack years: 9.00  . Smokeless tobacco: Never Used  . Tobacco comment: Only smokes when at work; doesn't smoke at home around children  Substance Use Topics  . Alcohol use: Not Currently    Comment: rare  . Drug use: No     Family Hx: The patient's family history includes Colon polyps in his mother; Diabetes in his mother  and paternal grandmother; Heart attack in his maternal grandmother and paternal grandfather; Heart failure in his father and mother; Obesity in his paternal grandmother; Pneumonia in his maternal grandfather. There is no history of Colon cancer.  ROS:   Please see the history of present illness.     All other systems reviewed and are negative.   Prior CV studies:   The following studies were reviewed today:  Jan 2018 cath  The left ventricular systolic function is normal.  LV end diastolic pressure is mildly elevated.  The left ventricular ejection fraction is 55-65% by visual estimate.   1. No significant CAD 2. Normal LV function 3. Mildly elevated LVEDP.  Plan: medical management for myocarditis   Study Conclusions   - Left ventricle: The cavity size was normal. Systolic function was  normal. The estimated ejection fraction was in the range of 55%  to 60%. Wall motion was normal; there were no regional wall  motion abnormalities. Left ventricular diastolic function  parameters were normal.  - Aortic valve: Trileaflet; normal thickness leaflets. There was no  regurgitation.  - Aortic root: The aortic root was normal in size.  - Left atrium: The atrium was normal in size.  - Right ventricle: Systolic function was normal.  - Tricuspid valve: There was mild regurgitation.  - Pulmonic valve: There was no regurgitation.  - Pulmonary arteries: Systolic pressure was within the normal  range.  - Inferior vena cava: The vessel was normal in size.  - Pericardium, extracardiac: There was no pericardial effusion.   Labs/Other Tests and Data Reviewed:    EKG:  No ECG reviewed.  Recent Labs: 08/07/2019: ALT 20 10/04/2019: BUN 9; Creatinine, Ser 1.01; Hemoglobin 15.7; Magnesium 2.2; Platelets 270; Potassium 3.8; Sodium 138   Recent Lipid Panel No results found for: CHOL, TRIG, HDL, CHOLHDL, LDLCALC, LDLDIRECT  Wt Readings from Last 3 Encounters:  11/29/19 263  lb (119.3 kg)  11/29/19 266 lb (120.7 kg)  11/20/19 268 lb 3.2 oz (121.7 kg)     Objective:    Vital Signs:   Today's Vitals   02/13/20 0805  BP: 137/76  Pulse: 94  Weight: 258 lb (117 kg)  Height: 6' (1.829 m)   Body mass index is 34.99 kg/m. Normal affect. Normal speech pattern and tone. Comfortable, no apparent distress. No audible signs of sob or wheezing.   ASSESSMENT & PLAN:    1. HIstory of myopericarditis - episode 3 years ago without recurrence - continue to monitor   2. Palpitations - symptoms resolved, no longer on diltiazem - monitor at this time  3. HTN - significant weight loss, was able to come off bp meds - bps remain at goal, monitor at this time     COVID-19 Education: The signs and symptoms of COVID-19 were discussed with the patient and how to seek care for testing (follow up with PCP or arrange E-visit).  The importance of social distancing was discussed today.  Time:   Today, I have spent 15 minutes with the patient with telehealth technology discussing the above problems.     Medication Adjustments/Labs and Tests Ordered: Current medicines are reviewed at length with the patient today.  Concerns regarding medicines are outlined above.   Tests Ordered: No orders of the defined types were placed in this encounter.   Medication Changes: No orders of the defined types were placed in this encounter.   Follow Up:  As needed  Signed, Carlyle Dolly, MD  02/13/2020 7:38 AM    [facility]

## 2020-02-13 NOTE — Patient Instructions (Signed)
Medication Instructions:  Your physician recommends that you continue on your current medications as directed. Please refer to the Current Medication list given to you today.  *If you need a refill on your cardiac medications before your next appointment, please call your pharmacy*   Lab Work: NONE   If you have labs (blood work) drawn today and your tests are completely normal, you will receive your results only by: Marland Kitchen MyChart Message (if you have MyChart) OR . A paper copy in the mail If you have any lab test that is abnormal or we need to change your treatment, we will call you to review the results.   Testing/Procedures: NONE    Follow-Up: At [facility], you and your health needs are our priority.  As part of our continuing mission to provide you with exceptional heart care, we have created designated Provider Care Teams.  These Care Teams include your primary Cardiologist (physician) and Advanced Practice Providers (APPs -  Physician Assistants and Nurse Practitioners) who all work together to provide you with the care you need, when you need it.  We recommend signing up for the patient portal called "MyChart".  Sign up information is provided on this After Visit Summary.  MyChart is used to connect with patients for Virtual Visits (Telemedicine).  Patients are able to view lab/test results, encounter notes, upcoming appointments, etc.  Non-urgent messages can be sent to your provider as well.   To learn more about what you can do with MyChart, go to [email or web address].    Your next appointment:    As Needed   The format for your next appointment:   Either In Person or Virtual  Provider:   You may see Dr. Harl Bowie  or one of the following Advanced Practice Providers on your designated Care Team:    Bernerd Pho, PA-C   Ermalinda Barrios, PA-C     Other Instructions Thank you for choosing [facility]!

## 2020-10-18 ENCOUNTER — Other Ambulatory Visit: Payer: 59

## 2020-10-18 DIAGNOSIS — Z20822 Contact with and (suspected) exposure to covid-19: Secondary | ICD-10-CM

## 2020-10-21 LAB — NOVEL CORONAVIRUS, NAA: SARS-CoV-2, NAA: DETECTED — AB

## 2021-01-22 ENCOUNTER — Ambulatory Visit
Admission: RE | Admit: 2021-01-22 | Discharge: 2021-01-22 | Disposition: A | Discharge status: Home / Self Care | Payer: 59 | Source: Ambulatory Visit | Attending: Emergency Medicine | Admitting: Emergency Medicine

## 2021-01-22 ENCOUNTER — Other Ambulatory Visit: Payer: Self-pay

## 2021-01-22 VITALS — BP 148/100 | HR 100 | Temp 97.8°F | Resp 18

## 2021-01-22 DIAGNOSIS — R059 Cough, unspecified: Secondary | ICD-10-CM

## 2021-01-22 DIAGNOSIS — R0981 Nasal congestion: Secondary | ICD-10-CM

## 2021-01-22 MED ORDER — PREDNISONE 20 MG PO TABS: 20.0000 mg | ORAL_TABLET | Freq: Two times a day (BID) | ORAL | 0 refills | Status: AC

## 2021-01-22 MED ORDER — BENZONATATE 100 MG PO CAPS: 100.0000 mg | ORAL_CAPSULE | Freq: Three times a day (TID) | ORAL | 0 refills | Status: DC

## 2021-01-22 NOTE — Discharge Instructions (Signed)
Unable to rule out cardiac disease or blood clot in urgent care setting.  Offered patient further evaluation and management in the ED.  Patient declines at this time and would like to try outpatient therapy first.  Aware of the risk associated with this decision including missed diagnosis, organ damage, organ failure, and/or death.  Patient aware and in agreement.      Get plenty of rest and push fluids Prescribed tessolone perles as needed for cough Prednisone prescribed.  Take as directed and to completion Use OTC medication as needed for symptomatic relief Follow up with PCP for recheck and/or if symptoms persists Return or go to ER if you have any new or worsening symptoms such as fever, chills, fatigue, shortness of breath, wheezing, chest pain, nausea, changes in bowel or bladder habits, etc..Marland Kitchen

## 2021-01-22 NOTE — ED Provider Notes (Signed)
[facility]   [id number] 01/22/21 Arrival Time: 3382  Cc: COUGH  SUBJECTIVE:  Juan Lopez is a 40 y.o. male who presents with cough, congestion, and runny nose x 4 days.  Denies sick exposure or precipitating event.  Describes cough as intermittent.  Has tried OTC medications without relief.  Symptoms are made worse at night.  Reports previous symptoms in the past with allergies.   Denies fever, chills, fatigue, sinus pain, rhinorrhea, sore throat, SOB, wheezing, chest pain, nausea, changes in bowel or bladder habits.    ROS: As per HPI.  All other pertinent ROS negative.     Past Medical History:  Diagnosis Date  . Acute myopericarditis    a. dx 10/2016  . Anxiety   . CAD (coronary artery disease)    a. LHC 10/2016 with mild luminal irregularities. Placed on statin   . Diverticulitis   . Family history of early CAD   . HTN (hypertension)    Past Surgical History:  Procedure Laterality Date  . CARDIAC CATHETERIZATION [name] 10/16/2016   Procedure: Left Heart Cath and Coronary Angiography;  Surgeon: Peter M Martinique, MD;  Location: [place] [facility];  Service: Cardiovascular;  Laterality: [name];  . COLONOSCOPY [name] 11/14/2017   Dr. Oneida Alar: 3 mm polyp from proximal ascending colon removed, rectosigmoid colon and sigmoid colon diverticula, external and internal hemorrhoids, random colon biopsies were benign.  Marland Kitchen LAPAROSCOPIC PARTIAL COLECTOMY [name] 10/01/2019   Procedure: LAPAROSCOPIC PARTIAL COLECTOMY;  Surgeon: Virl Cagey, MD;  Location: [facility];  Service: General;  Laterality: [name];  . POLYPECTOMY  11/14/2017   Procedure: POLYPECTOMY;  Surgeon: Danie Binder, MD;  [facility];  Service: Endoscopy;;  Ascending colon (CS)  . TONSILLECTOMY    . WISDOM TOOTH EXTRACTION     AGE [age]   Allergies  Allergen Reactions  . Bee Venom Nausea And Vomiting   No current facility-administered medications on file prior to encounter.   Current Outpatient Medications on  File Prior to Encounter  Medication Sig Dispense Refill  . acetaminophen (TYLENOL) 325 MG tablet Take 650 mg by mouth every 6 (six) hours as needed for moderate pain or headache.     Marland Kitchen FLUoxetine (PROZAC) 20 MG capsule Take 20 mg by mouth every evening.     . nitroGLYCERIN (NITROSTAT) 0.4 MG SL tablet Place 1 tablet (0.4 mg total) under the tongue every 5 (five) minutes x 3 doses as needed for chest pain. 25 tablet 12    Social History   Socioeconomic History  . Marital status: Married    Spouse name: Not on file  . Number of children: Not on file  . Years of education: Not on file  . Highest education level: Not on file  Occupational History  . Not on file  Tobacco Use  . Smoking status: Current Some Day Smoker    Packs/day: 0.50    Years: 18.00    Pack years: 9.00  . Smokeless tobacco: Never Used  . Tobacco comment: Only smokes when at work; doesn't smoke at home around children  Vaping Use  . Vaping Use: Never used  Substance and Sexual Activity  . Alcohol use: Not Currently    Comment: rare  . Drug use: No  . Sexual activity: Yes  Other Topics Concern  . 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 MARRIED: 2 YRS AND 15 YRS TOGETHER. NO CIGS OR ETOH.  SON AGE 5  AND HAD A DAUGHTER 3 WEEKS AGO(JAN 2019).    Social Determinants of Health   Financial Resource Strain: Not on file  Food Insecurity: Not on file  Transportation Needs: Not on file  Physical Activity: Not on file  Stress: Not on file  Social Connections: Not on file  Intimate Partner Violence: Not on file   Family History  Problem Relation Age of Onset  . Diabetes Mother   . Heart failure Mother   . Colon polyps Mother        REQUIRED COLECTOMY  . Heart failure Father   . Heart attack Maternal Grandmother   . Pneumonia Maternal Grandfather   . Diabetes Paternal Grandmother   . Obesity Paternal Grandmother   . Heart attack Paternal Grandfather   . Colon cancer Neg  Hx      OBJECTIVE:  Vitals:   01/22/21 0954  BP: (!) 148/100  Pulse: 100  Resp: 18  Temp: 97.8 F (36.6 C)  TempSrc: Oral  SpO2: 96%     General appearance: Alert, appears fatigued, but nontoxic; speaking in full sentences without difficulty HEENT:NCAT; Ears: EACs clear, TMs pearly gray; Eyes: PERRL.  EOM grossly intact. Nose: nares patent without rhinorrhea; Throat: tonsils nonerythematous or enlarged, uvula midline  Neck: supple without LAD Lungs: clear to auscultation bilaterally without adventitious breath sounds; normal respiratory effort; mild cough present Heart: regular rate and rhythm.   Skin: warm and dry Psychological: alert and cooperative; normal mood and affect   ASSESSMENT & PLAN:  1. Cough   2. Nasal congestion     Meds ordered this encounter  Medications  . benzonatate (TESSALON) 100 MG capsule    Sig: Take 1 capsule (100 mg total) by mouth every 8 (eight) hours.    Dispense:  21 capsule    Refill:  0    Order Specific Question:   Supervising Provider    Answer:   Raylene Everts [[id number]]  . predniSONE (DELTASONE) 20 MG tablet    Sig: Take 1 tablet (20 mg total) by mouth 2 (two) times daily with a meal for 5 days.    Dispense:  10 tablet    Refill:  0    Order Specific Question:   Supervising Provider    Answer:   Raylene Everts [[id number]]    No orders of the defined types were placed in this encounter.  Unable to rule out cardiac disease or blood clot in urgent care setting.  Offered patient further evaluation and management in the ED.  Patient declines at this time and would like to try outpatient therapy first.  Aware of the risk associated with this decision including missed diagnosis, organ damage, organ failure, and/or death.  Patient aware and in agreement.      Get plenty of rest and push fluids Prescribed tessolone perles as needed for cough Prednisone prescribed.  Take as directed and to completion Use OTC medication as needed  for symptomatic relief Follow up with PCP for recheck and/or if symptoms persists Return or go to ER if you have any new or worsening symptoms such as fever, chills, fatigue, shortness of breath, wheezing, chest pain, nausea, changes in bowel or bladder habits, etc...  Reviewed expectations re: course of current medical issues. Questions answered. Outlined signs and symptoms indicating need for more acute intervention. Patient verbalized understanding. After Visit Summary given.          Lestine Box, PA-C 01/22/21 1010

## 2021-01-22 NOTE — ED Triage Notes (Signed)
Coughing since Sunday, runny nose, head congestion.

## 2021-04-06 ENCOUNTER — Other Ambulatory Visit: Payer: Self-pay

## 2021-04-06 ENCOUNTER — Encounter (HOSPITAL_COMMUNITY): Payer: Self-pay

## 2021-04-06 ENCOUNTER — Emergency Department (HOSPITAL_COMMUNITY): Payer: 59

## 2021-04-06 ENCOUNTER — Emergency Department (HOSPITAL_COMMUNITY)
Admission: EM | Admit: 2021-04-06 | Discharge: 2021-04-06 | Disposition: A | Discharge status: Home / Self Care | Payer: 59 | Attending: Emergency Medicine | Admitting: Emergency Medicine

## 2021-04-06 DIAGNOSIS — F17210 Nicotine dependence, cigarettes, uncomplicated: Secondary | ICD-10-CM | POA: Insufficient documentation

## 2021-04-06 DIAGNOSIS — R1030 Lower abdominal pain, unspecified: Secondary | ICD-10-CM | POA: Diagnosis present

## 2021-04-06 DIAGNOSIS — K5732 Diverticulitis of large intestine without perforation or abscess without bleeding: Secondary | ICD-10-CM | POA: Diagnosis not present

## 2021-04-06 DIAGNOSIS — I2510 Atherosclerotic heart disease of native coronary artery without angina pectoris: Secondary | ICD-10-CM | POA: Insufficient documentation

## 2021-04-06 DIAGNOSIS — I10 Essential (primary) hypertension: Secondary | ICD-10-CM | POA: Diagnosis not present

## 2021-04-06 DIAGNOSIS — K5792 Diverticulitis of intestine, part unspecified, without perforation or abscess without bleeding: Secondary | ICD-10-CM

## 2021-04-06 LAB — URINALYSIS, ROUTINE W REFLEX MICROSCOPIC
Bacteria, UA: NONE SEEN
Bilirubin Urine: NEGATIVE
Glucose, UA: NEGATIVE mg/dL
Ketones, ur: NEGATIVE mg/dL
Leukocytes,Ua: NEGATIVE
Nitrite: NEGATIVE
Protein, ur: NEGATIVE mg/dL
Specific Gravity, Urine: 1.018 (ref 1.005–1.030)
pH: 6 (ref 5.0–8.0)

## 2021-04-06 LAB — LIPASE, BLOOD: Lipase: 30 U/L (ref 11–51)

## 2021-04-06 LAB — CBC
HCT: 57.2 % — ABNORMAL HIGH (ref 39.0–52.0)
Hemoglobin: 18.8 g/dL — ABNORMAL HIGH (ref 13.0–17.0)
MCH: 31.2 pg (ref 26.0–34.0)
MCHC: 32.9 g/dL (ref 30.0–36.0)
MCV: 94.9 fL (ref 80.0–100.0)
Platelets: 259 10*3/uL (ref 150–400)
RBC: 6.03 MIL/uL — ABNORMAL HIGH (ref 4.22–5.81)
RDW: 13.4 % (ref 11.5–15.5)
WBC: 15.6 10*3/uL — ABNORMAL HIGH (ref 4.0–10.5)
nRBC: 0 % (ref 0.0–0.2)

## 2021-04-06 LAB — COMPREHENSIVE METABOLIC PANEL
ALT: 32 U/L (ref 0–44)
AST: 16 U/L (ref 15–41)
Albumin: 4.4 g/dL (ref 3.5–5.0)
Alkaline Phosphatase: 96 U/L (ref 38–126)
Anion gap: 9 (ref 5–15)
BUN: 8 mg/dL (ref 6–20)
CO2: 29 mmol/L (ref 22–32)
Calcium: 9 mg/dL (ref 8.9–10.3)
Chloride: 99 mmol/L (ref 98–111)
Creatinine, Ser: 0.84 mg/dL (ref 0.61–1.24)
GFR, Estimated: >60 mL/min (ref >60)
Glucose, Bld: 100 mg/dL — ABNORMAL HIGH (ref 70–99)
Potassium: 4.4 mmol/L (ref 3.5–5.1)
Sodium: 137 mmol/L (ref 135–145)
Total Bilirubin: 0.6 mg/dL (ref 0.3–1.2)
Total Protein: 7.9 g/dL (ref 6.5–8.1)

## 2021-04-06 MED ORDER — MORPHINE SULFATE (PF) 4 MG/ML IV SOLN
4.0000 mg | Freq: Once | INTRAVENOUS | Status: AC
  Administered 2021-04-06: 4 mg via INTRAVENOUS
  Filled 2021-04-06: qty 1

## 2021-04-06 MED ORDER — OXYCODONE-ACETAMINOPHEN 5-325 MG PO TABS: 1.0000 | ORAL_TABLET | Freq: Four times a day (QID) | ORAL | 0 refills | Status: DC | PRN

## 2021-04-06 MED ORDER — METRONIDAZOLE 500 MG PO TABS
500.0000 mg | ORAL_TABLET | Freq: Three times a day (TID) | ORAL | 0 refills | Status: DC
Start: 2021-04-06 — End: 2021-04-24

## 2021-04-06 MED ORDER — CIPROFLOXACIN HCL 250 MG PO TABS
500.0000 mg | ORAL_TABLET | Freq: Once | ORAL | Status: AC
  Administered 2021-04-06: 500 mg via ORAL
  Filled 2021-04-06: qty 2

## 2021-04-06 MED ORDER — CIPROFLOXACIN HCL 500 MG PO TABS: 500.0000 mg | ORAL_TABLET | Freq: Two times a day (BID) | ORAL | 0 refills | Status: DC

## 2021-04-06 MED ORDER — IOHEXOL 300 MG/ML  SOLN
100.0000 mL | Freq: Once | INTRAMUSCULAR | Status: AC | PRN
  Administered 2021-04-06: 100 mL via INTRAVENOUS

## 2021-04-06 MED ORDER — SODIUM CHLORIDE 0.9 % IV BOLUS
1000.0000 mL | Freq: Once | INTRAVENOUS | Status: AC
  Administered 2021-04-06: 1000 mL via INTRAVENOUS

## 2021-04-06 MED ORDER — METRONIDAZOLE 500 MG PO TABS: 500.0000 mg | ORAL_TABLET | Freq: Three times a day (TID) | ORAL | 0 refills | Status: DC

## 2021-04-06 MED ORDER — METRONIDAZOLE 500 MG/100ML IV SOLN
500.0000 mg | Freq: Once | INTRAVENOUS | Status: AC
  Administered 2021-04-06: 500 mg via INTRAVENOUS
  Filled 2021-04-06: qty 100

## 2021-04-06 MED ORDER — ONDANSETRON HCL 4 MG/2ML IJ SOLN
4.0000 mg | Freq: Once | INTRAMUSCULAR | Status: AC
  Administered 2021-04-06: 4 mg via INTRAVENOUS
  Filled 2021-04-06: qty 2

## 2021-04-06 NOTE — ED Provider Notes (Signed)
[facility] EMERGENCY DEPARTMENT Provider Note   CSN: [id number] Arrival date & time: 04/06/21  0050     History Chief Complaint  Patient presents with   Abdominal Pain    Juan Lopez is a 40 y.o. male.  Patient is a 40 year old male with past medical history of hypertension and diverticulitis with colectomy 2 years ago.  Patient presenting today with lower abdominal discomfort that started several days ago.  It became much worse this evening while at work.  He describes a stabbing pain to his lower abdomen.  He denies any bloody stool or vomit.  He denies any diarrhea or "confusion.  The history is provided by the patient.  Abdominal Pain Pain location:  Suprapubic Pain quality: stabbing   Pain radiates to:  Does not radiate Pain severity:  Moderate Onset quality:  Gradual Duration:  3 days Timing:  Constant Progression:  Worsening Chronicity:  New Relieved by:  Nothing Worsened by:  Movement and palpation     Past Medical History:  Diagnosis Date   Acute myopericarditis    a. dx 10/2016   Anxiety    CAD (coronary artery disease)    a. LHC 10/2016 with mild luminal irregularities. Placed on statin    Diverticulitis    Family history of early CAD    HTN (hypertension)     Patient Active Problem List   Diagnosis Date Noted   Wound dehiscence, surgical 12/01/2019   Sigmoid diverticulitis 10/01/2019   Diarrhea 08/07/2019   Recurrent acute sigmoid diverticulitis 08/07/2019   Obesity (BMI 30-39.9) 08/07/2019   Acute diverticulitis 08/07/2019   Abdominal pain 07/31/2019   Diverticulitis large intestine w/o perforation or abscess w/o bleeding 08/16/2018   RUQ pain 08/16/2018   Fatty liver 08/16/2018   Change in bowel habits 11/02/2017   Acute myopericarditis 10/16/2016   Essential hypertension 10/16/2016   Family history of early CAD 10/16/2016    Past Surgical History:  Procedure Laterality Date   CARDIAC CATHETERIZATION [name] 10/16/2016   Procedure: Left  Heart Cath and Coronary Angiography;  Surgeon: Peter M Martinique, MD;  Location: [place] [facility];  Service: Cardiovascular;  Laterality: [name];   COLONOSCOPY [name] 11/14/2017   Dr. Oneida Alar: 3 mm polyp from proximal ascending colon removed, rectosigmoid colon and sigmoid colon diverticula, external and internal hemorrhoids, random colon biopsies were benign.   LAPAROSCOPIC PARTIAL COLECTOMY [name] 10/01/2019   Procedure: LAPAROSCOPIC PARTIAL COLECTOMY;  Surgeon: Virl Cagey, MD;  Location: [facility];  Service: General;  Laterality: [name];   POLYPECTOMY  11/14/2017   Procedure: POLYPECTOMY;  Surgeon: Danie Binder, MD;  [facility];  Service: Endoscopy;;  Ascending colon (CS)   TONSILLECTOMY     WISDOM TOOTH EXTRACTION     AGE [age]       Family History  Problem Relation Age of Onset   Diabetes Mother    Heart failure Mother    Colon polyps Mother        REQUIRED COLECTOMY   Heart failure Father    Heart attack Maternal Grandmother    Pneumonia Maternal Grandfather    Diabetes Paternal Grandmother    Obesity Paternal Grandmother    Heart attack Paternal Grandfather    Colon cancer Neg Hx     Social History   Tobacco Use   Smoking status: Some Days    Packs/day: 0.50    Years: 18.00    Pack years: 9.00    Types: Cigarettes   Smokeless tobacco: Never  Tobacco comments:    Only smokes when at work; doesn't smoke at home around children  Vaping Use   Vaping Use: Never used  Substance Use Topics   Alcohol use: Not Currently    Comment: rare   Drug use: No    Home Medications Prior to Admission medications   Medication Sig Start Date End Date Taking? Authorizing Provider  acetaminophen (TYLENOL) 325 MG tablet Take 650 mg by mouth every 6 (six) hours as needed for moderate pain or headache.     [provider]  benzonatate (TESSALON) 100 MG capsule Take 1 capsule (100 mg total) by mouth every 8 (eight) hours. 01/22/21   Wurst, [place], PA-C  FLUoxetine  (PROZAC) 20 MG capsule Take 20 mg by mouth every evening.  08/16/19   [provider]  nitroGLYCERIN (NITROSTAT) 0.4 MG SL tablet Place 1 tablet (0.4 mg total) under the tongue every 5 (five) minutes x 3 doses as needed for chest pain. 10/17/16   Eileen Stanford, PA-C    Allergies    Bee venom  Review of Systems   Review of Systems  Gastrointestinal:  Positive for abdominal pain.  All other systems reviewed and are negative.  Physical Exam Updated Vital Signs BP (!) 136/97   Pulse 83   Temp 99 F (37.2 C) (Oral)   Resp 18   Ht 6' (1.829 m)   Wt 119.3 kg   SpO2 97%   BMI 35.67 kg/m   Physical Exam Vitals and nursing note reviewed.  Constitutional:      General: He is not in acute distress.    Appearance: He is well-developed. He is not diaphoretic.  HENT:     Head: Normocephalic and atraumatic.  Cardiovascular:     Rate and Rhythm: Normal rate and regular rhythm.     Heart sounds: No murmur heard.   No friction rub.  Pulmonary:     Effort: Pulmonary effort is normal. No respiratory distress.     Breath sounds: Normal breath sounds. No wheezing or rales.  Abdominal:     General: Bowel sounds are normal. There is no distension.     Palpations: Abdomen is soft.     Tenderness: There is abdominal tenderness in the suprapubic area. There is no right CVA tenderness, left CVA tenderness, guarding or rebound.  Musculoskeletal:        General: Normal range of motion.     Cervical back: Normal range of motion and neck supple.  Skin:    General: Skin is warm and dry.  Neurological:     Mental Status: He is alert and oriented to person, place, and time.     Coordination: Coordination normal.    ED Results / Procedures / Treatments   Labs (all labs ordered are listed, but only abnormal results are displayed) Labs Reviewed  COMPREHENSIVE METABOLIC PANEL - Abnormal; Notable for the following components:      Result Value   Glucose, Bld 100 (*)    All other  components within normal limits  CBC - Abnormal; Notable for the following components:   WBC 15.6 (*)    RBC 6.03 (*)    Hemoglobin 18.8 (*)    HCT 57.2 (*)    All other components within normal limits  LIPASE, BLOOD  URINALYSIS, ROUTINE W REFLEX MICROSCOPIC    EKG None  Radiology No results found.  Procedures Procedures   Medications Ordered in ED Medications  sodium chloride 0.9 % bolus 1,000 mL (has  no administration in time range)  ondansetron (ZOFRAN) injection 4 mg (has no administration in time range)  morphine 4 MG/ML injection 4 mg (has no administration in time range)    ED Course  I have reviewed the triage vital signs and the nursing notes.  Pertinent labs & imaging results that were available during my care of the patient were reviewed by me and considered in my medical decision making (see chart for details).    MDM Rules/Calculators/A&P  Patient with history of diverticulitis with prior hemicolectomy presenting with suprapubic abdominal pain.  He has a white count and CT scan confirming acute, uncomplicated diverticulitis.  This will be treated with Cipro and Flagyl and pain medication.  To follow-up as needed.  Final Clinical Impression(s) / ED Diagnoses Final diagnoses:  None    Rx / DC Orders ED Discharge Orders     None        Veryl Speak, MD 04/06/21 [phone]

## 2021-04-06 NOTE — ED Triage Notes (Signed)
Patient complains of abdominal pain since Thursday. Patient has history of diverticulitis. Pain noted to be in lower abdomen. Patient states that pain is similar to past diverticulitis episodes.

## 2021-04-06 NOTE — Discharge Instructions (Addendum)
Begin taking Cipro and Flagyl as prescribed.  Begin taking Percocet as prescribed as needed for pain.  Follow-up with primary doctor if not improving in the next few days, and return to the ER if you develop worsening pain, high fevers, bloody stools, or other new and concerning symptoms.

## 2021-04-14 ENCOUNTER — Other Ambulatory Visit: Payer: Self-pay | Admitting: Gastroenterology

## 2021-04-14 ENCOUNTER — Telehealth: Payer: Self-pay

## 2021-04-14 ENCOUNTER — Telehealth: Payer: Self-pay | Admitting: Internal Medicine

## 2021-04-14 DIAGNOSIS — R112 Nausea with vomiting, unspecified: Secondary | ICD-10-CM

## 2021-04-14 MED ORDER — ONDANSETRON HCL 4 MG PO TABS: 4.0000 mg | ORAL_TABLET | Freq: Three times a day (TID) | ORAL | 0 refills | Status: DC | PRN

## 2021-04-14 NOTE — Telephone Encounter (Signed)
Returned the pt's call. He was seen in the ER July 4th with a flare of his diverticulitis and he has been very nauseated and vomiting for the past 2 days. He states he's not in much pain. His problem is nausea and vomiting would like a Rx sent to his pharmacy. He states he can't keep anything down. He thinks its the ciprofloxacin that is making him sick.

## 2021-04-14 NOTE — Telephone Encounter (Signed)
Sent to Juan Lopez from a phone note

## 2021-04-14 NOTE — Telephone Encounter (Signed)
Pt needs to speak with nurse about his medication. [phone]

## 2021-04-14 NOTE — Telephone Encounter (Signed)
Seeing this after hours. Please let patient know I will send in a Rx for Zofran 4 mg q8 hours as needed. Regarding diverticulitis, looks like he was sent in a 7 day course of antibiotics which should have been completed on 7/11. Has he completed his antibiotics? Is he having bowel movements? Any fever?

## 2021-04-15 NOTE — Telephone Encounter (Signed)
noted 

## 2021-04-15 NOTE — Telephone Encounter (Signed)
Phoned and spoke with the pt advised of Rx being at his pharmacy (he got a text from his pharmacy last night). Pt has not completed his antibiotics. He stated to me they put him on a 2 week course and he will be done next Monday. He is having bowel movements (diarrhea) not a lot, just what he is able to eat and no fever

## 2021-04-15 NOTE — Telephone Encounter (Signed)
Noted. Just to confirm, I am not sure he is taking his antibiotics correctly. Per ED note, patient be taking ciprofloxacin 500 mg twice daily and Flagyl 500 mg 3 times daily.  Just want to make sure he is taking correctly. Let us know of any ongoing problems.   We need to get him scheduled for a follow-up. Erline Levine, please arrange.  Dx- diverticulitis.  Okay to schedule with an app or Dr. Abbey Chatters.

## 2021-04-16 ENCOUNTER — Encounter: Payer: Self-pay | Admitting: Internal Medicine

## 2021-04-16 NOTE — Telephone Encounter (Signed)
Phoned the pt back and he repeated the same instructions as yesterday and that it is a 2 week course of meds. Advised the pt to notify us with any ongoing problems. He agreed.

## 2021-04-16 NOTE — Telephone Encounter (Signed)
Communication noted.  

## 2021-04-24 ENCOUNTER — Telehealth: Payer: Self-pay | Admitting: Internal Medicine

## 2021-04-24 MED ORDER — AMOXICILLIN-POT CLAVULANATE 875-125 MG PO TABS: 1.0000 | ORAL_TABLET | Freq: Two times a day (BID) | ORAL | 0 refills | Status: AC

## 2021-04-24 NOTE — Telephone Encounter (Signed)
Cipro/Flagyl taken recently for diverticulitis.   CT A/P 04/06/21 with Confluent inflammation in the mid sigmoid colon at the site of a prior sigmoid anastomosis, but this appears to be Acute Diverticulitis at a residual diverticulum. No perforation, abscess, or complicating features.   Has been nearly two years since we last saw him.   We will send in Augmentin '875mg'$  bid for 10 days.  He needs ov with APP or Dr. Abbey Chatters in the next 2-3 weeks.  Please let pt know plan, he should transition to clear liquid until abdominal pain improved.

## 2021-04-24 NOTE — Telephone Encounter (Signed)
Noted  

## 2021-04-24 NOTE — Addendum Note (Signed)
Addended by: Mahala Menghini on: 04/24/2021 01:33 PM   Modules accepted: Orders

## 2021-04-24 NOTE — Telephone Encounter (Signed)
[phone] please call patient, he finished his medication but he is starting to feel like he is getting "stopped up"again.  Does not have a follow up until November

## 2021-04-24 NOTE — Telephone Encounter (Signed)
Phoned and advised the pt that you did review his CT from 04/06/21, and that it has been 2 years since we last seen him. The pt stated he has a upcoming appt with Cyril Mourning in November but I advised him to call back Monday to try and get in sooner if possible. Also he is aware of the Rx being sent in for him to take 1 pill x 2 a day for 10 days and to transition to clear liquid until abd pain has improved. He agreed to do these things.

## 2021-04-24 NOTE — Telephone Encounter (Signed)
Returned the pt's call and was advised that he finished his course of antibiotics and he was good for 3 to 4 days. Then he started having lower abd pain (level 3 or 4) states it feels more uncomfortable than pain. I asked when his last bowel movement was and he said this morning but it was very little. No nausea/vomiting. I asked the pt do he think he needs something for constipation and he advised that he's not constipated he feels like his diverticulitis is flaring again or the medication did not get rid of it. Please advise.

## 2021-04-29 ENCOUNTER — Other Ambulatory Visit: Payer: Self-pay

## 2021-04-29 ENCOUNTER — Ambulatory Visit: Payer: 59 | Admitting: Gastroenterology

## 2021-04-29 ENCOUNTER — Encounter: Payer: Self-pay | Admitting: Gastroenterology

## 2021-04-29 VITALS — BP 141/96 | HR 82 | Temp 97.7°F | Ht 72.0 in | Wt 267.0 lb

## 2021-04-29 DIAGNOSIS — K5732 Diverticulitis of large intestine without perforation or abscess without bleeding: Secondary | ICD-10-CM | POA: Diagnosis not present

## 2021-04-29 NOTE — Patient Instructions (Signed)
Complete your course of antibiotics as prescribed.  Please call early next week to let me know how you are doing.  If you are doing well, we will work on scheduling you for a colonoscopy with Dr. Abbey Chatters.  You can also start a high fiber diet next week if you are still doing well.  Recommend benefiber 2 teaspoons daily x 2 weeks then increase to twice daily.   Aliene Altes, PA-C [facility]

## 2021-04-29 NOTE — Progress Notes (Signed)
Referring Provider: Redmond School, MD Primary Care Physician:  Redmond School, MD Primary GI Physician: Dr. Abbey Chatters  Chief Complaint  Patient presents with   Diverticulitis    Still has some discomfort. Bowels moving better now    HPI:   Juan Lopez is a 40 y.o. male presenting today with a history of chronic alternating constipation and diarrhea, recurrent diverticulitis s/p laparoscopic sigmoid colectomy December 2020.  Last colonoscopy was in 2019, due for repeat in 2024.  Patient was seen in the emergency room 7/4 for lower abdominal pain.  CT revealed confluent inflammation in the mid sigmoid colon at site of prior sigmoid anastomosis which appeared to be acute diverticulitis at residual diverticulum without complicating features.  He was treated with Cipro and Flagyl x7 days.  Patient called our office back on 7/22 stating he had finished his antibiotics and did well for 3 to 4 days, but had return of lower abdominal pain and felt his diverticulitis was flaring again.  He was prescribed Augmentin 875 mg twice daily x10 days.  Recommended clear liquid diet until abdominal pain improves.  Today:  On day 6 on Augmentin. Bowels are moving better. Soft and formed. No blood in the stool or black stools. Overall abdominal pain is much better.  Occurs intermittently, not daily.  When it occurs, it is usually mild discomfort prior to BM that improves thereafter.   Nausea has resolved. Was secondary to prior antibiotics.   Tylenol only for headaches.   Denies unintentional weight loss or any significant upper GI symptoms.   Past Medical History:  Diagnosis Date   Acute myopericarditis    a. dx 10/2016   Anxiety    CAD (coronary artery disease)    a. LHC 10/2016 with mild luminal irregularities. Placed on statin    Diverticulitis    Family history of early CAD    HTN (hypertension)     Past Surgical History:  Procedure Laterality Date   CARDIAC CATHETERIZATION [name]  10/16/2016   Procedure: Left Heart Cath and Coronary Angiography;  Surgeon: Peter M Martinique, MD;  Location: [facility];  Service: Cardiovascular;  Laterality: [name];   COLONOSCOPY [name] 11/14/2017   Dr. Oneida Alar: 3 mm polyp from proximal ascending colon removed, rectosigmoid colon and sigmoid colon diverticula, external and internal hemorrhoids, random colon biopsies were benign.   LAPAROSCOPIC PARTIAL COLECTOMY [name] 10/01/2019   Procedure: LAPAROSCOPIC PARTIAL COLECTOMY;  Surgeon: Virl Cagey, MD;  Location: [facility];  Service: General;  Laterality: [name];   POLYPECTOMY  11/14/2017   Procedure: POLYPECTOMY;  Surgeon: Danie Binder, MD;  Location: [facility];  Service: Endoscopy;;  Ascending colon (CS)   TONSILLECTOMY     WISDOM TOOTH EXTRACTION     AGE 51-25    Current Outpatient Medications  Medication Sig Dispense Refill   acetaminophen (TYLENOL) 325 MG tablet Take 650 mg by mouth every 6 (six) hours as needed for moderate pain or headache.      amoxicillin-clavulanate (AUGMENTIN) 875-125 MG tablet Take 1 tablet by mouth 2 (two) times daily for 10 days. 20 tablet 0   FLUoxetine (PROZAC) 20 MG capsule Take 20 mg by mouth every evening.      No current facility-administered medications for this visit.    Allergies as of 04/29/2021 - Review Complete 04/29/2021  Allergen Reaction Noted   Bee venom Nausea And Vomiting 03/27/2014    Family History  Problem Relation Age of Onset   Diabetes Mother  Heart failure Mother    Colon polyps Mother        REQUIRED COLECTOMY   Heart failure Father    Heart attack Maternal Grandmother    Pneumonia Maternal Grandfather    Diabetes Paternal Grandmother    Obesity Paternal Grandmother    Heart attack Paternal Grandfather    Colon cancer Neg Hx     Social History   Socioeconomic History   Marital status: Married    Spouse name: Not on file   Number of children: Not on file   Years of education: Not on file   Highest education  level: Not on file  Occupational History   Not on file  Tobacco Use   Smoking status: Some Days    Packs/day: 0.50    Years: 18.00    Pack years: 9.00    Types: Cigarettes   Smokeless tobacco: Never   Tobacco comments:    Only smokes when at work; doesn't smoke at home around children  Vaping Use   Vaping Use: Never used  Substance and Sexual Activity   Alcohol use: Yes    Comment: occ   Drug use: No   Sexual activity: Yes  Other Topics Concern   Not on file  Social History Narrative   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 MARRIED: 2 YRS AND 15 YRS TOGETHER. NO CIGS OR ETOH.  SON AGE 22 AND HAD A DAUGHTER 3 WEEKS AGO(JAN 2019).    Social Determinants of Health   Financial Resource Strain: Not on file  Food Insecurity: Not on file  Transportation Needs: Not on file  Physical Activity: Not on file  Stress: Not on file  Social Connections: Not on file    Review of Systems: Gen: Denies fever, chills, cold or flulike symptoms, presyncope, syncope. CV: Denies chest pain, palpitations Resp: Denies dyspnea or cough. GI: See HPI Heme: See HPI  Physical Exam: BP (!) 141/96   Pulse 82   Temp 97.7 F (36.5 C) (Temporal)   Ht 6' (1.829 m)   Wt 267 lb (121.1 kg)   BMI 36.21 kg/m  General:   Alert and oriented. No distress noted. Pleasant and cooperative.  Head:  Normocephalic and atraumatic. Eyes:  Conjuctiva clear without scleral icterus. Heart:  S1, S2 present without murmurs appreciated. Lungs:  Clear to auscultation bilaterally. No wheezes, rales, or rhonchi. No distress.  Abdomen:  +BS, soft, and non-distended.  Very mild TTP in the suprapubic area.  No rebound or guarding. No HSM or masses noted. Msk:  Symmetrical without gross deformities. Normal posture. Extremities:  Without edema. Neurologic:  Alert and  oriented x4 Psych:  Normal mood and affect.   Assessment: 40 year old male with history of chronic alternating constipation and diarrhea, recurrent  diverticulitis s/p laparoscopic sigmoid colectomy December 2020, last colonoscopy was in 2019 and recommended repeat in 2024.  He presents today for further evaluation of diverticulitis.  He had been doing well until early July when he developed lower abdominal pain.  CT revealed confluent inflammation in the mid sigmoid colon at site of prior sigmoid anastomosis which appeared to be acute diverticulitis at residual diverticulum without complicating features.  He was treated with Cipro and Flagyl x7 days with initial improvement, but relapsed 3 to 4 days later.  He is now on day 6 of Augmentin and is improving.  Bowels are moving well without BRBPR or melena.  Abdominal pain much improved, only with mild intermittent discomfort prior to bowel movements that improves thereafter.  No other significant GI symptoms.  No alarm symptoms.    Plan: 1.  Complete course of Augmentin. 2.  He will need repeat colonoscopy to follow-up on diverticulitis.  Requested progress report next week.  If he continues to do well, we will work on getting this arranged in the next 6 to 8 weeks. 3.  Recommended starting high-fiber diet after he completes his course of antibiotics.      -Recommended Benefiber 2 teaspoons daily x2 weeks and increase to twice daily.    Aliene Altes, PA-C [facility] 04/29/2021

## 2021-05-06 ENCOUNTER — Telehealth: Payer: Self-pay | Admitting: Gastroenterology

## 2021-05-06 MED ORDER — PEG 3350-KCL-NA BICARB-NACL 420 G PO SOLR: ORAL | 0 refills | Status: DC

## 2021-05-06 NOTE — Addendum Note (Signed)
Addended by: Cheron Every on: 05/06/2021 09:21 AM   Modules accepted: Orders

## 2021-05-06 NOTE — Telephone Encounter (Signed)
Called pt. He has been scheduled for 9/20 at 7:30am. Aware will mail prep instructions and send Rx to pharmacy.

## 2021-05-06 NOTE — Telephone Encounter (Signed)
Spoke with patient this morning.  He has completed his antibiotics for diverticulitis and is feeling very well.  Denies abdominal pain.  His bowels are moving well.  He is ready to schedule follow-up colonoscopy which should be in about 6-8 weeks.  [facility] Clinical Pool:  Please arrange colonoscopy with propofol with Dr. Abbey Chatters in 6-8 weeks to follow-up on diverticulitis. ASA II

## 2021-05-07 NOTE — Telephone Encounter (Signed)
PA submitted via [facility] and approved. The notification/prior authorization reference number is [id number]. DOS 06/23/2021-09/21/2021  Your Notification/Prior Authorization submission has been Approved and no further action is required for this request

## 2021-06-18 ENCOUNTER — Other Ambulatory Visit: Payer: Self-pay | Admitting: *Deleted

## 2021-06-18 MED ORDER — PEG 3350-KCL-NA BICARB-NACL 420 G PO SOLR: ORAL | 0 refills | Status: DC

## 2021-06-18 NOTE — Progress Notes (Signed)
Pt called and needs prep Rx resent in.

## 2021-06-23 ENCOUNTER — Encounter (HOSPITAL_COMMUNITY): Payer: Self-pay

## 2021-06-23 ENCOUNTER — Ambulatory Visit (HOSPITAL_COMMUNITY)
Admission: RE | Admit: 2021-06-23 | Discharge: 2021-06-23 | Disposition: A | Discharge status: Home / Self Care | Payer: 59 | Attending: Internal Medicine | Admitting: Internal Medicine

## 2021-06-23 ENCOUNTER — Ambulatory Visit (HOSPITAL_COMMUNITY): Payer: 59 | Admitting: Certified Registered Nurse Anesthetist

## 2021-06-23 ENCOUNTER — Other Ambulatory Visit: Payer: Self-pay

## 2021-06-23 ENCOUNTER — Encounter (HOSPITAL_COMMUNITY)
Admission: RE | Disposition: A | Discharge status: Home / Self Care | Payer: Self-pay | Source: Home / Self Care | Attending: Internal Medicine

## 2021-06-23 DIAGNOSIS — Z9049 Acquired absence of other specified parts of digestive tract: Secondary | ICD-10-CM | POA: Diagnosis not present

## 2021-06-23 DIAGNOSIS — Z980 Intestinal bypass and anastomosis status: Secondary | ICD-10-CM | POA: Diagnosis not present

## 2021-06-23 DIAGNOSIS — Z8371 Family history of colonic polyps: Secondary | ICD-10-CM | POA: Insufficient documentation

## 2021-06-23 DIAGNOSIS — F17210 Nicotine dependence, cigarettes, uncomplicated: Secondary | ICD-10-CM | POA: Diagnosis not present

## 2021-06-23 DIAGNOSIS — K648 Other hemorrhoids: Secondary | ICD-10-CM | POA: Insufficient documentation

## 2021-06-23 DIAGNOSIS — K5732 Diverticulitis of large intestine without perforation or abscess without bleeding: Secondary | ICD-10-CM | POA: Diagnosis not present

## 2021-06-23 DIAGNOSIS — Z79899 Other long term (current) drug therapy: Secondary | ICD-10-CM | POA: Insufficient documentation

## 2021-06-23 HISTORY — PX: COLONOSCOPY WITH PROPOFOL: SHX5780

## 2021-06-23 HISTORY — PX: BIOPSY: SHX5522

## 2021-06-23 SURGERY — COLONOSCOPY WITH PROPOFOL
Anesthesia: General

## 2021-06-23 MED ORDER — PROPOFOL 10 MG/ML IV BOLUS
INTRAVENOUS | Status: DC | PRN
  Administered 2021-06-23: 100 mg via INTRAVENOUS
  Administered 2021-06-23: 50 mg via INTRAVENOUS

## 2021-06-23 MED ORDER — STERILE WATER FOR IRRIGATION IR SOLN
Status: DC | PRN
  Administered 2021-06-23: 100 mL

## 2021-06-23 MED ORDER — PROPOFOL 500 MG/50ML IV EMUL
INTRAVENOUS | Status: DC | PRN
  Administered 2021-06-23: 150 ug/kg/min via INTRAVENOUS

## 2021-06-23 MED ORDER — LACTATED RINGERS IV SOLN: INTRAVENOUS | Status: DC

## 2021-06-23 NOTE — Anesthesia Postprocedure Evaluation (Signed)
Anesthesia Post Note  Patient: Juan Lopez  Procedure(s) Performed: COLONOSCOPY WITH PROPOFOL BIOPSY  Patient location during evaluation: Phase II Anesthesia Type: General Level of consciousness: awake Pain management: pain level controlled Vital Signs Assessment: post-procedure vital signs reviewed and stable Respiratory status: spontaneous breathing and respiratory function stable Cardiovascular status: blood pressure returned to baseline and stable Postop Assessment: no headache and no apparent nausea or vomiting Anesthetic complications: no Comments: Late entry   No notable events documented.   Last Vitals:  Vitals:   06/23/21 0649 06/23/21 0816  BP: (!) 140/92 (!) 82/53  Pulse: 85 77  Resp: 14 14  Temp: 36.9 C 36.6 C  SpO2: 96% 94%    Last Pain:  Vitals:   06/23/21 0816  TempSrc: Oral  PainSc: 0-No pain                 Louann Sjogren

## 2021-06-23 NOTE — Transfer of Care (Signed)
Immediate Anesthesia Transfer of Care Note  Patient: Juan Lopez  Procedure(s) Performed: COLONOSCOPY WITH PROPOFOL BIOPSY  Patient Location: Endoscopy Unit  Anesthesia Type:General  Level of Consciousness: drowsy  Airway & Oxygen Therapy: Patient Spontanous Breathing  Post-op Assessment: Report given to RN and Post -op Vital signs reviewed and stable  Post vital signs: Reviewed and stable  Last Vitals:  Vitals Value Taken Time  BP    Temp    Pulse 77   Resp    SpO2 93%     Last Pain:  Vitals:   06/23/21 0758  TempSrc:   PainSc: 0-No pain      Patients Stated Pain Goal: 7 (93/57/01 7793)  Complications: No notable events documented.

## 2021-06-23 NOTE — Anesthesia Preprocedure Evaluation (Signed)
Anesthesia Evaluation  Patient identified by MRN, date of birth, ID band Patient awake    Reviewed: Allergy & Precautions, H&P , NPO status , Patient's Chart, lab work & pertinent test results, reviewed documented beta blocker date and time   Airway Mallampati: II  TM Distance: >3 FB Neck ROM: full    Dental no notable dental hx.    Pulmonary neg pulmonary ROS, Current Smoker and Patient abstained from smoking.,    Pulmonary exam normal breath sounds clear to auscultation       Cardiovascular Exercise Tolerance: Good hypertension, + CAD   Rhythm:regular Rate:Normal     Neuro/Psych PSYCHIATRIC DISORDERS Anxiety negative neurological ROS     GI/Hepatic negative GI ROS, Neg liver ROS,   Endo/Other  negative endocrine ROS  Renal/GU negative Renal ROS  negative genitourinary   Musculoskeletal   Abdominal   Peds  Hematology negative hematology ROS (+)   Anesthesia Other Findings   Reproductive/Obstetrics negative OB ROS                             Anesthesia Physical Anesthesia Plan  ASA: 2  Anesthesia Plan: General   Post-op Pain Management:    Induction:   PONV Risk Score and Plan: Propofol infusion  Airway Management Planned:   Additional Equipment:   Intra-op Plan:   Post-operative Plan:   Informed Consent: I have reviewed the patients History and Physical, chart, labs and discussed the procedure including the risks, benefits and alternatives for the proposed anesthesia with the patient or authorized representative who has indicated his/her understanding and acceptance.     Dental Advisory Given  Plan Discussed with: CRNA  Anesthesia Plan Comments:         Anesthesia Quick Evaluation

## 2021-06-23 NOTE — H&P (Signed)
Primary Care Physician:  Redmond School, MD Primary Gastroenterologist:  Dr. Abbey Chatters  Pre-Procedure History & Physical: HPI:  Juan Lopez is a 40 y.o. male is here for a colonoscopy for recurrent diverticulitis. History of sigmoidectomy due to diverticulitis in the past. Patient was seen in the emergency room 7/4 for lower abdominal pain.  CT revealed confluent inflammation in the mid sigmoid colon at site of prior sigmoid anastomosis which appeared to be acute diverticulitis at residual diverticulum without complicating features.  He was treated with Cipro and Flagyl x7 days.   Patient called our office back on 7/22 stating he had finished his antibiotics and did well for 3 to 4 days, but had return of lower abdominal pain and felt his diverticulitis was flaring again.  He was prescribed Augmentin 875 mg twice daily x10 days.  Recommended clear liquid diet until abdominal pain improves.  Past Medical History:  Diagnosis Date   Acute myopericarditis    a. dx 10/2016   Anxiety    CAD (coronary artery disease)    a. LHC 10/2016 with mild luminal irregularities. Placed on statin    Diverticulitis    Family history of early CAD    HTN (hypertension)     Past Surgical History:  Procedure Laterality Date   CARDIAC CATHETERIZATION [name] 10/16/2016   Procedure: Left Heart Cath and Coronary Angiography;  Surgeon: Peter M Martinique, MD;  Location: [facility];  Service: Cardiovascular;  Laterality: [name];   COLONOSCOPY [name] 11/14/2017   Dr. Oneida Alar: 3 mm polyp from proximal ascending colon removed, rectosigmoid colon and sigmoid colon diverticula, external and internal hemorrhoids, random colon biopsies were benign.   LAPAROSCOPIC PARTIAL COLECTOMY [name] 10/01/2019   Procedure: LAPAROSCOPIC PARTIAL COLECTOMY;  Surgeon: Virl Cagey, MD;  Location: [facility];  Service: General;  Laterality: [name];   POLYPECTOMY  11/14/2017   Procedure: POLYPECTOMY;  Surgeon: Danie Binder, MD;  Location[facility];  Service: Endoscopy;;  Ascending colon (CS)   TONSILLECTOMY     WISDOM TOOTH EXTRACTION     AGE 47-25    Prior to Admission medications   Medication Sig Start Date End Date Taking? Authorizing Provider  acetaminophen (TYLENOL) 325 MG tablet Take 650 mg by mouth every 6 (six) hours as needed for moderate pain or headache.    Yes [provider]  FLUoxetine (PROZAC) 20 MG capsule Take 20 mg by mouth every evening.  08/16/19  Yes [provider]  polyethylene glycol-electrolytes (NULYTELY) 420 g solution As directed 06/18/21  Yes Jodi Mourning, Kristen S, PA-C  loratadine (CLARITIN) 10 MG tablet Take 10 mg by mouth daily as needed for allergies.    [provider]    Allergies as of 05/06/2021 - Review Complete 04/29/2021  Allergen Reaction Noted   Bee venom Nausea And Vomiting 03/27/2014    Family History  Problem Relation Age of Onset   Diabetes Mother    Heart failure Mother    Colon polyps Mother        REQUIRED COLECTOMY   Heart failure Father    Heart attack Maternal Grandmother    Pneumonia Maternal Grandfather    Diabetes Paternal Grandmother    Obesity Paternal Grandmother    Heart attack Paternal Grandfather    Colon cancer Neg Hx     Social History   Socioeconomic History   Marital status: Married    Spouse name: Not on file   Number of children: Not on file   Years of  education: Not on file   Highest education level: Not on file  Occupational History   Not on file  Tobacco Use   Smoking status: Some Days    Packs/day: 0.50    Years: 18.00    Pack years: 9.00    Types: Cigarettes   Smokeless tobacco: Never   Tobacco comments:    Only smokes when at work; doesn't smoke at home around children  Vaping Use   Vaping Use: Never used  Substance and Sexual Activity   Alcohol use: Yes    Comment: occ   Drug use: No   Sexual activity: Yes  Other Topics Concern   Not on file  Social History Narrative   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 MARRIED: 2 YRS AND 15 YRS TOGETHER. NO CIGS OR ETOH.  SON AGE 17 AND HAD A DAUGHTER 3 WEEKS AGO(JAN 2019).    Social Determinants of Health   Financial Resource Strain: Not on file  Food Insecurity: Not on file  Transportation Needs: Not on file  Physical Activity: Not on file  Stress: Not on file  Social Connections: Not on file  Intimate Partner Violence: Not on file    Review of Systems: See HPI, otherwise negative ROS  Physical Exam: Vital signs in last 24 hours: Temp:  [98.5 F (36.9 C)] 98.5 F (36.9 C) (09/20 0649) Pulse Rate:  [85] 85 (09/20 0649) Resp:  [14] 14 (09/20 0649) BP: (140)/(92) 140/92 (09/20 0649) SpO2:  [96 %] 96 % (09/20 0649) Weight:  [117.9 kg] 117.9 kg (09/20 0649)   General:   Alert,  Well-developed, well-nourished, pleasant and cooperative in NAD Head:  Normocephalic and atraumatic. Eyes:  Sclera clear, no icterus.   Conjunctiva pink. Ears:  Normal auditory acuity. Nose:  No deformity, discharge,  or lesions. Mouth:  No deformity or lesions, dentition normal. Neck:  Supple; no masses or thyromegaly. Lungs:  Clear throughout to auscultation.   No wheezes, crackles, or rhonchi. No acute distress. Heart:  Regular rate and rhythm; no murmurs, clicks, rubs,  or gallops. Abdomen:  Soft, nontender and nondistended. No masses, hepatosplenomegaly or hernias noted. Normal bowel sounds, without guarding, and without rebound.   Msk:  Symmetrical without gross deformities. Normal posture. Extremities:  Without clubbing or edema. Neurologic:  Alert and  oriented x4;  grossly normal neurologically. Skin:  Intact without significant lesions or rashes. Cervical Nodes:  No significant cervical adenopathy. Psych:  Alert and cooperative. Normal mood and affect.  Impression/Plan: [name] is here for a colonoscopy to be performed for recurrent diverticulitis.   The risks of the procedure including infection, bleed, or perforation as  well as benefits, limitations, alternatives and imponderables have been reviewed with the patient. Questions have been answered. All parties agreeable.

## 2021-06-23 NOTE — Op Note (Signed)
[facility] Patient Name: Juan Lopez Procedure Date: 06/23/2021 7:51 AM MRN: [id number] Date of Birth: 05-18-1981 Attending MD: Elon Alas. Abbey Chatters DO CSN: [id number] Age: 40 Admit Type: Outpatient Procedure:                Colonoscopy Indications:              Follow-up of diverticulitis Providers:                Elon Alas. Abbey Chatters, DO, Caprice Kluver, Randa Spike,                            Technician Referring MD:              Medicines:                See the Anesthesia note for documentation of the                            administered medications Complications:            No immediate complications. Estimated Blood Loss:     Estimated blood loss was minimal. Procedure:                Pre-Anesthesia Assessment:                           - The anesthesia plan was to use monitored                            anesthesia care (MAC).                           After obtaining informed consent, the colonoscope                            was passed under direct vision. Throughout the                            procedure, the patient's blood pressure, pulse, and                            oxygen saturations were monitored continuously. The                            [id number]-HQ190L ([id number]) scope was introduced through                            the anus and advanced to the the terminal ileum,                            with identification of the appendiceal orifice and                            IC valve. The colonoscopy was performed without                            difficulty. The patient tolerated the procedure  well. The quality of the bowel preparation was                            evaluated using the BBPS [facility] Bowel Preparation                            Scale) with scores of: Right Colon = 3, Transverse                            Colon = 3 and Left Colon = 3 (entire mucosa seen                            well with no residual staining, small  fragments of                            stool or opaque liquid). The total BBPS score                            equals 9. Scope In: 8:01:31 AM Scope Out: 8:13:08 AM Scope Withdrawal Time: 0 hours 9 minutes 33 seconds  Total Procedure Duration: 0 hours 11 minutes 37 seconds  Findings:      The perianal and digital rectal examinations were normal.      Non-bleeding internal hemorrhoids were found during endoscopy.      There was evidence of a prior end-to-end colo-colonic anastomosis in the       sigmoid colon. This was patent and was characterized by congestion,       erosion and erythema. The anastomosis was traversed. Biopsies were taken       with a cold forceps for histology.      The terminal ileum appeared normal. Impression:               - Non-bleeding internal hemorrhoids.                           - Patent end-to-end colo-colonic anastomosis,                            characterized by congestion, erosion and erythema.                            Biopsied.                           - The examined portion of the ileum was normal. Moderate Sedation:      Per Anesthesia Care Recommendation:           - Patient has a contact number available for                            emergencies. The signs and symptoms of potential                            delayed complications were discussed with the  patient. Return to normal activities tomorrow.                            Written discharge instructions were provided to the                            patient.                           - Resume previous diet.                           - Continue present medications.                           - Await pathology results.                           - Repeat colonoscopy date to be determined after                            pending pathology results are reviewed for                            surveillance based on pathology results.                           - Return  to GI clinic in 6 months.                           - Use fiber, for example Citrucel, Fibercon, Konsyl                            or Metamucil. Procedure Code(s):        --- Professional ---                           [phone], Colonoscopy, flexible; with biopsy, single                            or multiple Diagnosis Code(s):        --- Professional ---                           Z98.0, Intestinal bypass and anastomosis status                           K64.8, Other hemorrhoids                           K57.32, Diverticulitis of large intestine without                            perforation or abscess without bleeding CPT copyright 2019 American Medical Association. All rights reserved. The codes documented in this report are preliminary and upon coder review may  be revised to meet current compliance requirements. Elon Alas. Abbey Chatters, DO Juanda Crumble  Arlee Muslim, DO 06/23/2021 8:19:36 AM This report has been signed electronically. Number of Addenda: 0

## 2021-06-23 NOTE — Discharge Instructions (Signed)
  Colonoscopy Discharge Instructions  Read the instructions outlined below and refer to this sheet in the next few weeks. These discharge instructions provide you with general information on caring for yourself after you leave the hospital. Your doctor may also give you specific instructions. While your treatment has been planned according to the most current medical practices available, unavoidable complications occasionally occur.   ACTIVITY You may resume your regular activity, but move at a slower pace for the next 24 hours.  Take frequent rest periods for the next 24 hours.  Walking will help get rid of the air and reduce the bloated feeling in your belly (abdomen).  No driving for 24 hours (because of the medicine (anesthesia) used during the test).   Do not sign any important legal documents or operate any machinery for 24 hours (because of the anesthesia used during the test).  NUTRITION Drink plenty of fluids.  You may resume your normal diet as instructed by your doctor.  Begin with a light meal and progress to your normal diet. Heavy or fried foods are harder to digest and may make you feel sick to your stomach (nauseated).  Avoid alcoholic beverages for 24 hours or as instructed.  MEDICATIONS You may resume your normal medications unless your doctor tells you otherwise.  WHAT YOU CAN EXPECT TODAY Some feelings of bloating in the abdomen.  Passage of more gas than usual.  Spotting of blood in your stool or on the toilet paper.  IF YOU HAD POLYPS REMOVED DURING THE COLONOSCOPY: No aspirin products for 7 days or as instructed.  No alcohol for 7 days or as instructed.  Eat a soft diet for the next 24 hours.  FINDING OUT THE RESULTS OF YOUR TEST Not all test results are available during your visit. If your test results are not back during the visit, make an appointment with your caregiver to find out the results. Do not assume everything is normal if you have not heard from your  caregiver or the medical facility. It is important for you to follow up on all of your test results.  SEEK IMMEDIATE MEDICAL ATTENTION IF: You have more than a spotting of blood in your stool.  Your belly is swollen (abdominal distention).  You are nauseated or vomiting.  You have a temperature over 101.  You have abdominal pain or discomfort that is severe or gets worse throughout the day.   Your colon looked healthy.  I did not find any polyps or evidence of colon cancer.  At the anastomosis from your previous surgery there was an area of congestion and inflammation.  I took biopsies of this.  No active diverticulitis that I saw.  Your end portion of your small bowel looked healthy.  Await pathology results, my office will contact you. I would recommend increasing fiber in your diet or adding OTC Benefiber/Metamucil. Be sure to drink at least 4 to 6 glasses of water daily. Follow-up with GI in 6 months   I hope you have a great rest of your week!  Elon Alas. Abbey Chatters, D.O. Gastroenterology and Hepatology [facility]

## 2021-06-24 LAB — SURGICAL PATHOLOGY

## 2021-06-29 ENCOUNTER — Encounter (HOSPITAL_COMMUNITY): Payer: Self-pay | Admitting: Internal Medicine

## 2021-08-14 IMAGING — CT CT ABD-PELV W/ CM
2 of 4 series · 16 of 46 positions shown, 18 images · IV contrast (Omnipaque or Isovue)
Comparison: 07/31/2019

CLINICAL DATA: Abdominal pain, history of recent diverticulitis.

EXAM:
CT ABDOMEN AND PELVIS WITH CONTRAST
TECHNIQUE: Multidetector CT imaging of the abdomen and pelvis was performed
using the standard protocol following bolus administration of
intravenous contrast.
CONTRAST:  100mL OMNIPAQUE IOHEXOL 300 MG/ML  SOLN

[Series 2: axial st · axial · 0.88mm/px · z∈[+727,+1187]mm · 13 of 104 slices shown, 15 images]
[im 6/104  soft-tissue]
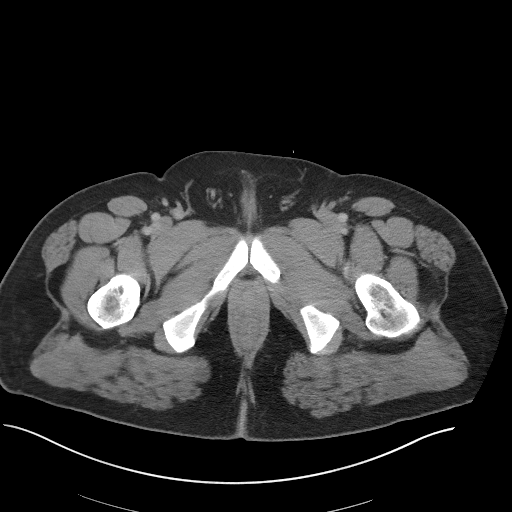
[im 6/104  bone]
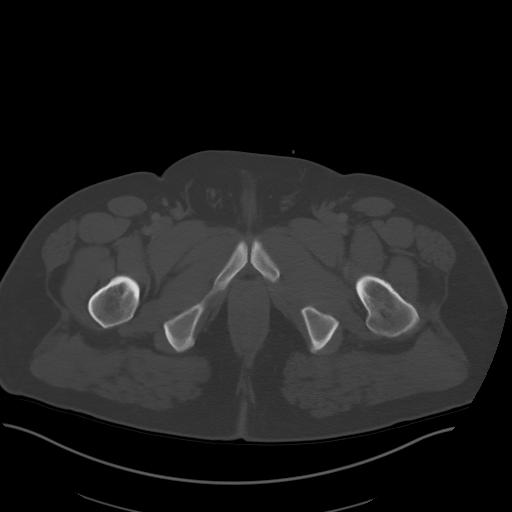
[im 12/104  soft-tissue]
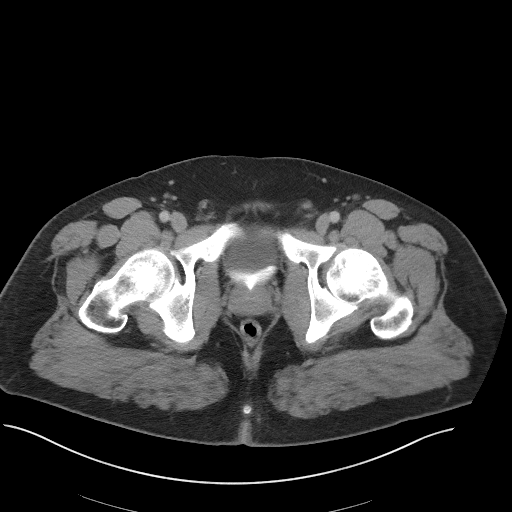
[im 23/104  soft-tissue]
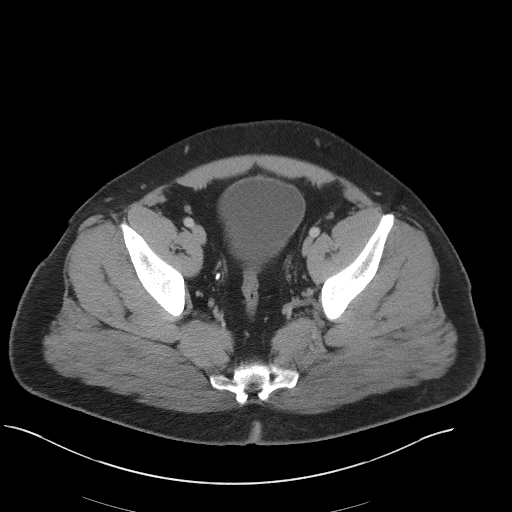
[im 29/104  soft-tissue]
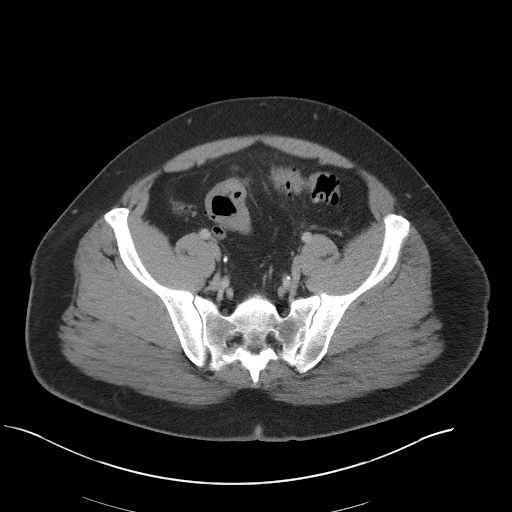
[im 35/104  soft-tissue]
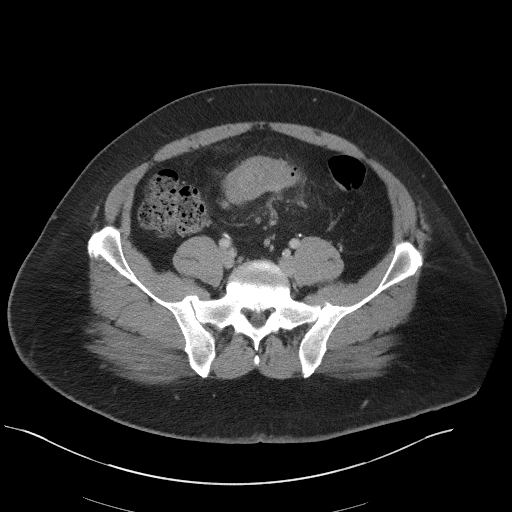
[im 46/104  soft-tissue]
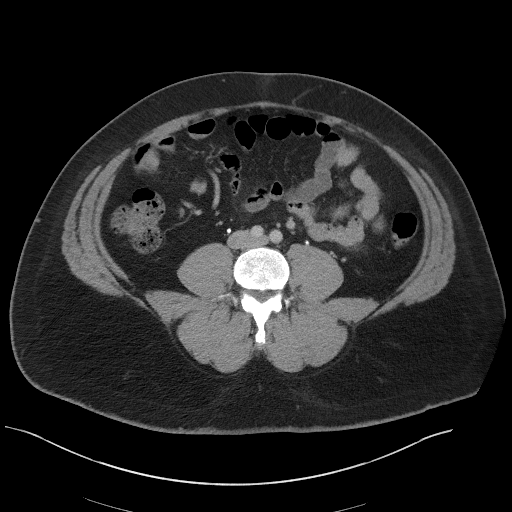
[im 52/104  soft-tissue]
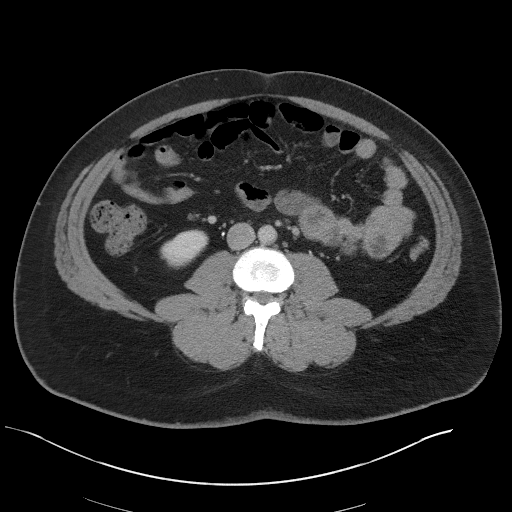
[im 58/104  soft-tissue]
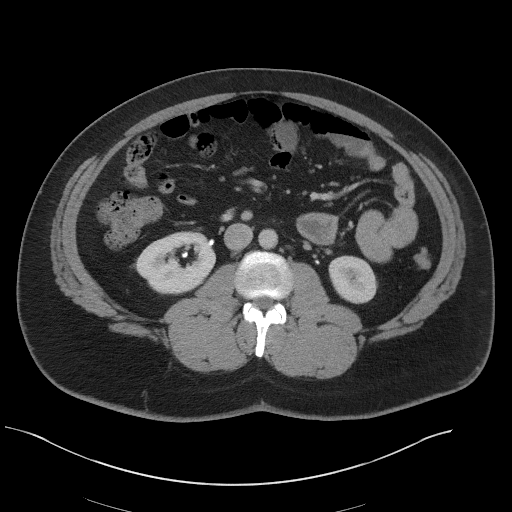
[im 69/104  soft-tissue]
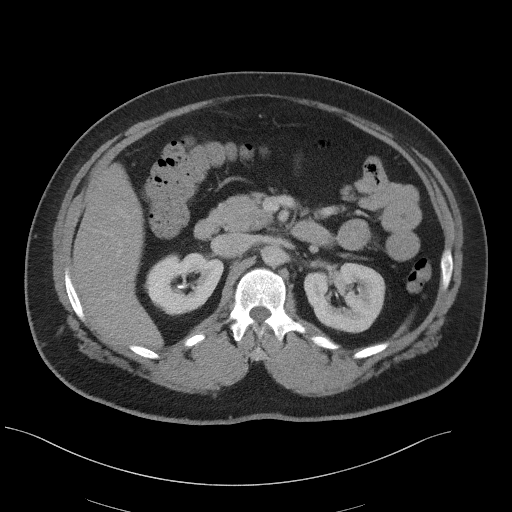
[im 69/104  bone]
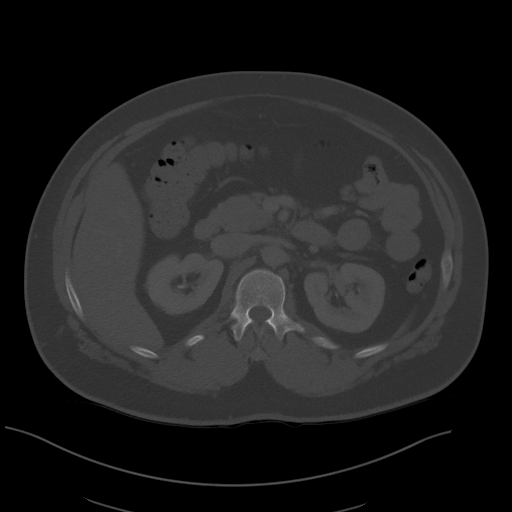
[im 75/104  soft-tissue]
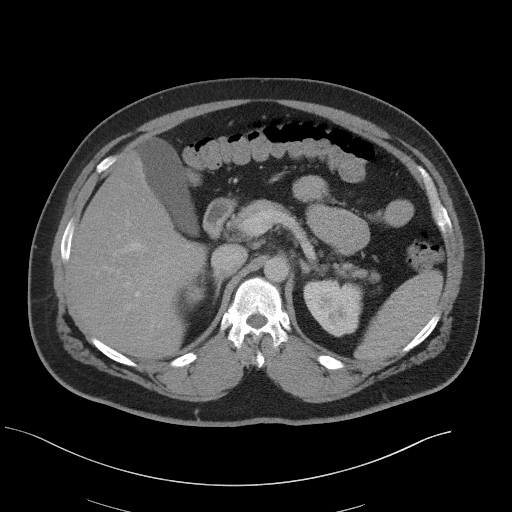
[im 81/104  soft-tissue]
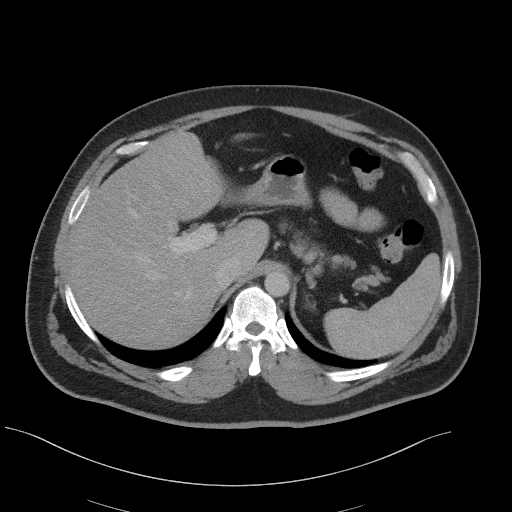
[im 92/104  soft-tissue]
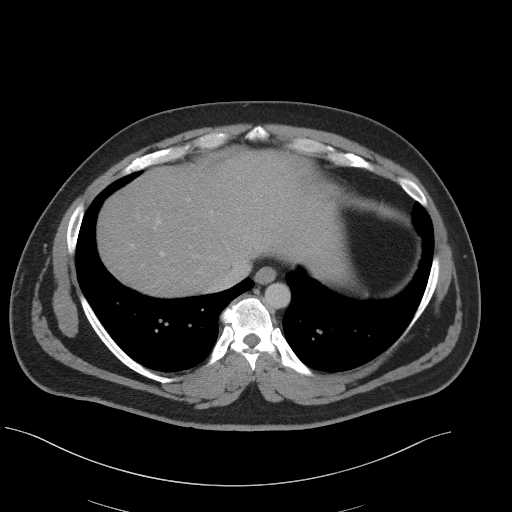
[im 98/104  soft-tissue]
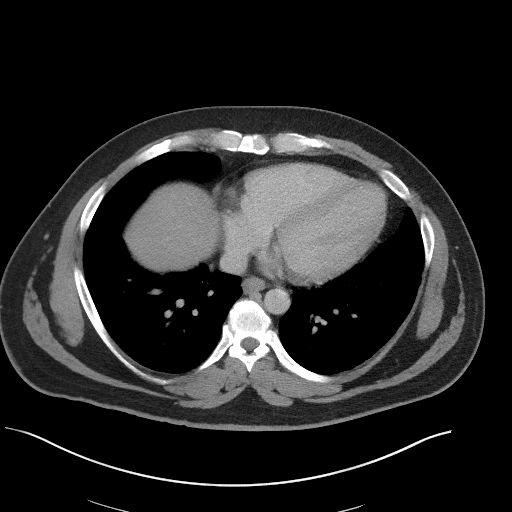

[Series 5: coronal st · coronal · 0.91mm/px · 3 of 117 slices shown]
[im 39/117  soft-tissue]
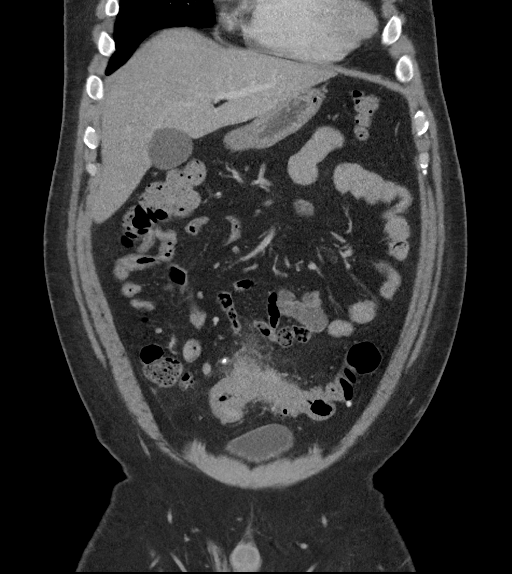
[im 52/117  soft-tissue]
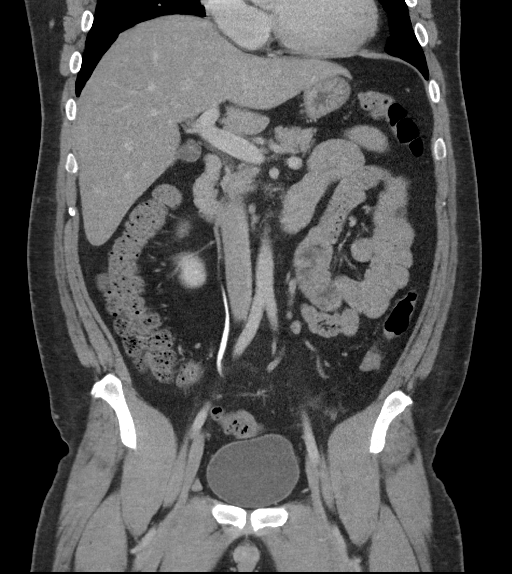
[im 65/117  soft-tissue]
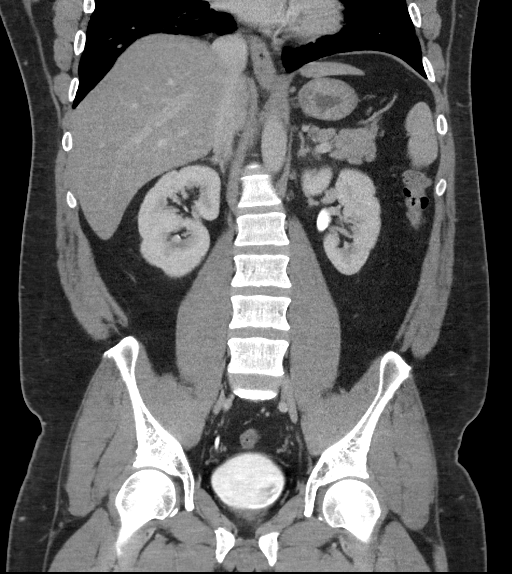

[16 of 46 positions shown; findings below may reference images not displayed]

FINDINGS: Lower chest: No acute abnormality.

Hepatobiliary: Fatty infiltration of the liver is noted. The
gallbladder is within normal limits.

Pancreas: Unremarkable. No pancreatic ductal dilatation or
surrounding inflammatory changes.

Spleen: Normal in size without focal abnormality.

Adrenals/Urinary Tract: Adrenal glands are within normal limits.
Kidneys demonstrate normal excretion bilaterally. No renal mass
lesion is seen. No obstructive changes are noted. The bladder is
partially distended.

Stomach/Bowel: There again changes consistent with sigmoid
diverticulitis. Slight increase in pericolonic inflammatory change
is noted. No evidence of perforation or focal abscess formation is
noted at this time. Persistent wall thickening is seen. The more
proximal colon demonstrates diverticular change without focal
abnormality. The appendix is well visualized with inspissated
barium. Small bowel is within normal limits. The stomach is
decompressed.

Vascular/Lymphatic: No significant vascular findings are present. No
enlarged abdominal or pelvic lymph nodes.

Reproductive: Prostate is unremarkable.

Other: No abdominal wall hernia or abnormality. No abdominopelvic
ascites.

Musculoskeletal: No acute or significant osseous findings.
IMPRESSION: Persistent diverticulitis. The degree of pericolonic inflammatory
change has increased somewhat in the interval from the prior exam
although no drainable collection or perforation is identified at
this time.

No other focal abnormality is noted.

## 2021-08-19 ENCOUNTER — Ambulatory Visit: Payer: 59 | Admitting: Gastroenterology

## 2021-08-22 ENCOUNTER — Encounter (HOSPITAL_COMMUNITY): Payer: Self-pay | Admitting: Emergency Medicine

## 2021-08-22 ENCOUNTER — Emergency Department (HOSPITAL_COMMUNITY)
Admission: EM | Admit: 2021-08-22 | Discharge: 2021-08-22 | Disposition: A | Discharge status: Home / Self Care | Payer: 59 | Attending: Emergency Medicine | Admitting: Emergency Medicine

## 2021-08-22 ENCOUNTER — Other Ambulatory Visit: Payer: Self-pay

## 2021-08-22 ENCOUNTER — Emergency Department (HOSPITAL_COMMUNITY): Payer: 59

## 2021-08-22 DIAGNOSIS — I2510 Atherosclerotic heart disease of native coronary artery without angina pectoris: Secondary | ICD-10-CM | POA: Diagnosis not present

## 2021-08-22 DIAGNOSIS — F17210 Nicotine dependence, cigarettes, uncomplicated: Secondary | ICD-10-CM | POA: Diagnosis not present

## 2021-08-22 DIAGNOSIS — M25561 Pain in right knee: Secondary | ICD-10-CM

## 2021-08-22 DIAGNOSIS — I10 Essential (primary) hypertension: Secondary | ICD-10-CM | POA: Diagnosis not present

## 2021-08-22 MED ORDER — IBUPROFEN 600 MG PO TABS: 600.0000 mg | ORAL_TABLET | Freq: Four times a day (QID) | ORAL | 0 refills | Status: AC | PRN

## 2021-08-22 MED ORDER — KETOROLAC TROMETHAMINE 60 MG/2ML IM SOLN
30.0000 mg | Freq: Once | INTRAMUSCULAR | Status: AC
  Administered 2021-08-22: 30 mg via INTRAMUSCULAR
  Filled 2021-08-22: qty 2

## 2021-08-22 NOTE — Discharge Instructions (Signed)
You are seen here today for evaluation of your right knee pain.  There were no bony normalities visualized on the x-ray.  This is likely a ligament or tendon injury.  You have been referred to Dr. Alvan Dame office, who is an orthopedic provider.  Please follow-up with his office for additional evaluation and a possible MRI.  You have been prescribed ibuprofen 6 mg take every 6 hours as needed.  Please take this medication with food as it can be harsh on your stomach.  If you have any concern, new or worsening symptoms, please return to the nearest emergency department for reevaluation.

## 2021-08-22 NOTE — ED Provider Notes (Signed)
[facility] EMERGENCY DEPARTMENT Provider Note   CSN: [id number] Arrival date & time: 08/22/21  1610     History Chief Complaint  Patient presents with   Knee Pain    Juan Lopez is a 40 y.o. male senting to the emergency department for evaluation of his right knee after he stepped on a toy and fell.  Patient report as he stepped on a toy, his knee, turned weird, and he landed on it.  This happened today around 1200.  Patient reports he took 1000 g Tylenol without relief.  Reports swelling.  Denies any numbness or tingling.  Patient drove here with his left leg.  Denies any surgeries on the knee.  No known drug allergies.  Denies any tobacco, EtOH, or drug use.     Knee Pain Associated symptoms: no back pain       Past Medical History:  Diagnosis Date   Acute myopericarditis    a. dx 10/2016   Anxiety    CAD (coronary artery disease)    a. LHC 10/2016 with mild luminal irregularities. Placed on statin    Diverticulitis    Family history of early CAD    HTN (hypertension)     Patient Active Problem List   Diagnosis Date Noted   Nausea and vomiting 04/14/2021   Wound dehiscence, surgical 12/01/2019   Sigmoid diverticulitis 10/01/2019   Diarrhea 08/07/2019   Recurrent acute sigmoid diverticulitis 08/07/2019   Obesity (BMI 30-39.9) 08/07/2019   Acute diverticulitis 08/07/2019   Abdominal pain 07/31/2019   Diverticulitis of colon 08/16/2018   RUQ pain 08/16/2018   Fatty liver 08/16/2018   Change in bowel habits 11/02/2017   Acute myopericarditis 10/16/2016   Essential hypertension 10/16/2016   Family history of early CAD 10/16/2016    Past Surgical History:  Procedure Laterality Date   BIOPSY  06/23/2021   Procedure: BIOPSY;  Surgeon: Eloise Harman, DO;  Location: [facility];  Service: Endoscopy;;   CARDIAC CATHETERIZATION [name] 10/16/2016   Procedure: Left Heart Cath and Coronary Angiography;  Surgeon: Peter M Martinique, MD;  Location: [place] [facility];   Service: Cardiovascular;  Laterality: [name];   COLONOSCOPY [name] 11/14/2017   Dr. Oneida Alar: 3 mm polyp from proximal ascending colon removed, rectosigmoid colon and sigmoid colon diverticula, external and internal hemorrhoids, random colon biopsies were benign.   COLONOSCOPY WITH PROPOFOL [name] 06/23/2021   Procedure: COLONOSCOPY WITH PROPOFOL;  Surgeon: Eloise Harman, DO;  Location: [facility];  Service: Endoscopy;  Laterality: [name];  7:30am   LAPAROSCOPIC PARTIAL COLECTOMY [name] 10/01/2019   Procedure: LAPAROSCOPIC PARTIAL COLECTOMY;  Surgeon: Virl Cagey, MD;  Location: [facility];  Service: General;  Laterality: [name];   POLYPECTOMY  11/14/2017   Procedure: POLYPECTOMY;  Surgeon: Danie Binder, MD;  Location: [facility];  Service: Endoscopy;;  Ascending colon (CS)   TONSILLECTOMY     WISDOM TOOTH EXTRACTION     AGE 61-25       Family History  Problem Relation Age of Onset   Diabetes Mother    Heart failure Mother    Colon polyps Mother        REQUIRED COLECTOMY   Heart failure Father    Heart attack Maternal Grandmother    Pneumonia Maternal Grandfather    Diabetes Paternal Grandmother    Obesity Paternal Grandmother    Heart attack Paternal Grandfather    Colon cancer Neg Hx     Social History   Tobacco Use  Smoking status: Some Days    Packs/day: 0.50    Years: 18.00    Pack years: 9.00    Types: Cigarettes   Smokeless tobacco: Never   Tobacco comments:    Only smokes when at work; doesn't smoke at home around children  Vaping Use   Vaping Use: Never used  Substance Use Topics   Alcohol use: Yes    Comment: occ   Drug use: No    Home Medications Prior to Admission medications   Medication Sig Start Date End Date Taking? Authorizing Provider  ibuprofen (ADVIL) 600 MG tablet Take 1 tablet (600 mg total) by mouth every 6 (six) hours as needed. 08/22/21  Yes Sherrell Puller, PA-C  acetaminophen (TYLENOL) 325 MG tablet Take 650 mg by mouth every 6 (six) hours as  needed for moderate pain or headache.     [provider]  FLUoxetine (PROZAC) 20 MG capsule Take 20 mg by mouth every evening.  08/16/19   [provider]  loratadine (CLARITIN) 10 MG tablet Take 10 mg by mouth daily as needed for allergies.    [provider]  polyethylene glycol-electrolytes (NULYTELY) 420 g solution As directed 06/18/21   Erenest Rasher, PA-C    Allergies    Bee venom  Review of Systems   Review of Systems  Musculoskeletal:  Positive for arthralgias, gait problem and joint swelling. Negative for back pain.   Physical Exam Updated Vital Signs BP (!) 152/106   Pulse 97   Temp 98.6 F (37 C) (Oral)   Resp 20   Ht 6' (1.829 m)   Wt 114.8 kg   SpO2 97%   BMI 34.31 kg/m   Physical Exam Constitutional:      General: He is not in acute distress.    Appearance: Normal appearance. He is not toxic-appearing.  Eyes:     General: No scleral icterus. Pulmonary:     Effort: Pulmonary effort is normal. No respiratory distress.  Musculoskeletal:        General: Swelling and tenderness present. No deformity.     Comments: Right lower extremity -compartments soft.  Small bruising noted to the medial side of the right knee.  Tender to palpation.  No obvious deformity or step-offs seen or palpated.  Patient has some mild swelling to the superior portion of his knee.  Patient reports the pain is mainly in the back of his knee.  Was unable to perform anterior posterior drawer test due to patient's pain.  Range of motion limited secondary to pain.  Sensation intact.  DP and PT pulses intact.  Skin:    General: Skin is dry.     Findings: No rash.  Neurological:     General: No focal deficit present.     Mental Status: He is alert. Mental status is at baseline.  Psychiatric:        Mood and Affect: Mood normal.    ED Results / Procedures / Treatments   Labs (all labs ordered are listed, but only abnormal results are displayed) Labs Reviewed -  No data to display  EKG None  Radiology No results found.  Procedures Procedures   Medications Ordered in ED Medications  ketorolac (TORADOL) injection 30 mg (30 mg Intramuscular Given 08/22/21 1914)    ED Course  I have reviewed the triage vital signs and the nursing notes.  Pertinent labs & imaging results that were available during my care of the patient were reviewed by me and considered  in my medical decision making (see chart for details).  40 year old male presents the emergency department after right knee injury.  X-ray obtained and shows moderate to large effusion of the suprapatellar bursa, otherwise no displaced fracture or dislocation seen.  I discussed with the patient that there is no bony abnormality, but there is likely muscle or tendon damage or injury.  I discussed with him that he will most likely need an MRI and a follow-up with orthopedics.  I placed him in a knee brace and gave him crutches.  Additionally I included in referral to orthopedics on the discharge paperwork.  Prescribed ibuprofen 600 mg take every 6 hours as needed and provided a work note.  Strict return precautions given.  Patient agrees with plan.  Patient is stable being discharged home in good condition.    MDM Rules/Calculators/A&P                          Final Clinical Impression(s) / ED Diagnoses Final diagnoses:  Acute pain of right knee    Rx / DC Orders ED Discharge Orders          Ordered    ibuprofen (ADVIL) 600 MG tablet  Every 6 hours PRN        08/22/21 1910             Sherrell Puller, Hershal Coria 08/25/21 0036    Milton Ferguson, MD 08/25/21 [phone]

## 2021-08-22 NOTE — ED Triage Notes (Signed)
Pt c/o RT knee pain and swelling after tripping over a toy. Pt ambulatory, but states it does hurt to walk.

## 2021-12-20 NOTE — Progress Notes (Signed)
? ? ? ?GI Office Note   ? ?Referring Provider: Redmond School, MD ?Primary Care Physician:  Redmond School, MD  ?Primary Gastroenterologist:Charles K. Abbey Chatters, DO ? ?Chief Complaint  ? ?Chief Complaint  ?Patient presents with  ? Follow-up  ?  Has issues with constipation and diarrhea. Last bm was this morning and was "extremely soft".  ? ? ?History of Present Illness  ? ?Juan Lopez is a 41 y.o. male presenting today for follow-up.  Patient was last seen in July.  He has a history of alternating constipation, diarrhea, recurrent diverticulitis status post sigmoid colectomy December 2020.  He was treated for diverticulitis in July 2022 based on CT findings.  He updated colonoscopy as outlined below. ? ?Today: still having some issues. May have hard stool then 5-6 days of diarrhea. Sometimes cramping before BMs.  Sometimes the pain gets bad, he will start MiraLAX to soften his stool.  Never takes antidiarrheals.  No melena.  No fever.  Hemorrhoids worse, bleeding and uncomfortable. Not interested in banding right now. No heartburn, vomiting, dysphagia.  No weight loss.  His weight is up 25 pounds since November.  Patient states that he works alternating shifts for [employer].  12-hour shifts.  Difficult to manage lifestyle.  States he only eats when he is at work.  Otherwise he is asleep at home.  Often forgets his Prozac.  Does not take his Benefiber on a regular basis. ? ?Colonoscopy September 2022: ?-Nonbleeding internal hemorrhoids ?-Patent end-to-end colo-colonic anastomosis characterized by erythema congestion, erosions status post biopsy, with hyperemia and mild nonspecific inflammatory changes differential includes diverticulitis related mucosal changes ?-Normal terminal ileum ?-Next colonoscopy in September 2027 due to family history. ? ? ?Medications  ? ?Current Outpatient Medications  ?Medication Sig Dispense Refill  ? acetaminophen (TYLENOL) 325 MG tablet Take 650 mg by mouth every 6 (six)  hours as needed for moderate pain or headache.     ? FLUoxetine (PROZAC) 20 MG capsule Take 20 mg by mouth every evening.     ? ibuprofen (ADVIL) 600 MG tablet Take 1 tablet (600 mg total) by mouth every 6 (six) hours as needed. 30 tablet 0  ? ?No current facility-administered medications for this visit.  ? ? ?Allergies  ? ?Allergies as of 12/21/2021 - Review Complete 12/21/2021  ?Allergen Reaction Noted  ? Bee venom Nausea And Vomiting 03/27/2014  ? ? ?Past Medical History  ? ?Past Medical History:  ?Diagnosis Date  ? Acute myopericarditis   ? a. dx 10/2016  ? Anxiety   ? CAD (coronary artery disease)   ? a. LHC 10/2016 with mild luminal irregularities. Placed on statin   ? Diverticulitis   ? Family history of early CAD   ? HTN (hypertension)   ? ? ?Past Surgical History  ? ?Past Surgical History:  ?Procedure Laterality Date  ? BIOPSY  06/23/2021  ? Procedure: BIOPSY;  Surgeon: Eloise Harman, DO;  [facility];  Service: Endoscopy;;  ? CARDIAC CATHETERIZATION [name] 10/16/2016  ? Procedure: Left Heart Cath and Coronary Angiography;  Surgeon: Peter M Martinique, MD;  Location: [facility];  Service: Cardiovascular;  Laterality: [name];  ? COLONOSCOPY [name] 11/14/2017  ? Dr. Oneida Alar: 3 mm polyp from proximal ascending colon removed, rectosigmoid colon and sigmoid colon diverticula, external and internal hemorrhoids, random colon biopsies were benign.  ? COLONOSCOPY WITH PROPOFOL [name] 06/23/2021  ? Nonbleeding internal hemorrhoids, patent anastomosis characterized by erythema/congestion/erosions with nonspecific inflammatory changes on biopsy.  Next colonoscopy in 5 years due to family history.  Procedure: COLONOSCOPY WITH PROPOFOL;  Surgeon: Eloise Harman, DO;  [facility];  Service: Endoscopy;  Laterality: [name];  7:30am  ? LAPAROSCOPIC PARTIAL COLECTOMY [name] 10/01/2019  ? Procedure: LAPAROSCOPIC PARTIAL COLECTOMY;  Surgeon: Virl Cagey, MD;  Location: [facility];  Service: General;   Laterality: [name];  ? POLYPECTOMY  11/14/2017  ? Procedure: POLYPECTOMY;  Surgeon: Danie Binder, MD;  [facility];  Service: Endoscopy;;  Ascending colon (CS)  ? TONSILLECTOMY    ? WISDOM TOOTH EXTRACTION    ? AGE 19-25  ? ? ?Past Family History  ? ?Family History  ?Problem Relation Age of Onset  ? Diabetes Mother   ? Heart failure Mother   ? Colon polyps Mother   ?     REQUIRED COLECTOMY  ? Heart failure Father   ? Heart attack Maternal Grandmother   ? Pneumonia Maternal Grandfather   ? Diabetes Paternal Grandmother   ? Obesity Paternal Grandmother   ? Heart attack Paternal Grandfather   ? Colon cancer Neg Hx   ? ? ?Past Social History  ? ?Social History  ? ?Socioeconomic History  ? Marital status: Married  ?  Spouse name: Not on file  ? Number of children: Not on file  ? Years of education: Not on file  ? Highest education level: Not on file  ?Occupational History  ? Not on file  ?Tobacco Use  ? Smoking status: Some Days  ?  Packs/day: 0.50  ?  Years: 18.00  ?  Pack years: 9.00  ?  Types: Cigarettes  ? Smokeless tobacco: Never  ? Tobacco comments:  ?  Only smokes when at work; doesn't smoke at home around children  ?Vaping Use  ? Vaping Use: Never used  ?Substance and Sexual Activity  ? Alcohol use: Yes  ?  Comment: occ  ? Drug use: No  ? Sexual activity: Yes  ?Other Topics Concern  ? Not on file  ?Social History Narrative  ? 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 MARRIED: 2 YRS AND 15 YRS TOGETHER. NO CIGS OR ETOH.  SON AGE 15 AND HAD A DAUGHTER 3 WEEKS AGO(JAN 2019).   ? ?Social Determinants of Health  ? ?Financial Resource Strain: Not on file  ?Food Insecurity: Not on file  ?Transportation Needs: Not on file  ?Physical Activity: Not on file  ?Stress: Not on file  ?Social Connections: Not on file  ?Intimate Partner Violence: Not on file  ? ? ?Review of Systems  ? ?General: Negative for anorexia, weight loss, fever, chills, fatigue, weakness. ?ENT: Negative for hoarseness, difficulty swallowing ,  nasal congestion. ?CV: Negative for chest pain, angina, palpitations, dyspnea on exertion, peripheral edema.  ?Respiratory: Negative for dyspnea at rest, dyspnea on exertion, cough, sputum, wheezing.  ?GI: See history of present illness. ?GU:  Negative for dysuria, hematuria, urinary incontinence, urinary frequency, nocturnal urination.  ?Endo: Negative for unusual weight change.  ?   ?Physical Exam  ? ?BP 138/90 (BP Location: Right Arm, Patient Position: Sitting, Cuff Size: Large)   Pulse 65   Temp 97.7 ?F (36.5 ?C) (Temporal)   Ht 6' (1.829 m)   Wt 280 lb (127 kg)   SpO2 96%   BMI 37.97 kg/m?  ?  ?General: Well-nourished, well-developed in no acute distress.  ?Eyes: No icterus. ?Mouth: masked ?Abdomen: Bowel sounds are normal, nontender, nondistended, no hepatosplenomegaly or masses,  ?no abdominal bruits or hernia ,  no rebound or guarding.  ?Rectal: not performed  ?Extremities: No lower extremity edema. No clubbing or deformities. ?Neuro: Alert and oriented x 4   ?Skin: Warm and dry, no jaundice.   ?Psych: Alert and cooperative, normal mood and affect. ? ?Labs  ? ?Lab Results  ?Component Value Date  ? CREATININE 0.84 04/06/2021  ? BUN 8 04/06/2021  ? NA 137 04/06/2021  ? K 4.4 04/06/2021  ? CL 99 04/06/2021  ? CO2 29 04/06/2021  ? ?Lab Results  ?Component Value Date  ? ALT 32 04/06/2021  ? AST 16 04/06/2021  ? ALKPHOS 96 04/06/2021  ? BILITOT 0.6 04/06/2021  ? ?Lab Results  ?Component Value Date  ? WBC 15.6 (H) 04/06/2021  ? HGB 18.8 (H) 04/06/2021  ? HCT 57.2 (H) 04/06/2021  ? MCV 94.9 04/06/2021  ? PLT 259 04/06/2021  ? ?Lab Results  ?Component Value Date  ? LIPASE 30 04/06/2021  ? ? ?Imaging Studies  ? ?No results found. ? ?Assessment  ? ?Hepatic steatosis: Noted on prior imaging.  LFTs are normal.  BMI 37.  Discussed fatty liver.  Needs to strive for healthier lifestyle, regular exercise, drop excess weight, limit alcohol consumption.  Recommend yearly labs. ? ?Diverticulitis: History of recurrent  diverticulitis status post laparoscopic sigmoid colectomy in December 2020.  Recent colonoscopy with erythema/edema/erosions at the anastomosis with no malignancy on biopsy.  Findings most consistent with "diverticulitis".  N

## 2021-12-21 ENCOUNTER — Ambulatory Visit: Payer: 59 | Admitting: Gastroenterology

## 2021-12-21 ENCOUNTER — Encounter: Payer: Self-pay | Admitting: Gastroenterology

## 2021-12-21 ENCOUNTER — Other Ambulatory Visit: Payer: Self-pay

## 2021-12-21 VITALS — BP 138/90 | HR 65 | Temp 97.7°F | Ht 72.0 in | Wt 280.0 lb

## 2021-12-21 DIAGNOSIS — K760 Fatty (change of) liver, not elsewhere classified: Secondary | ICD-10-CM

## 2021-12-21 DIAGNOSIS — R198 Other specified symptoms and signs involving the digestive system and abdomen: Secondary | ICD-10-CM | POA: Diagnosis not present

## 2021-12-21 NOTE — Patient Instructions (Addendum)
Please add a fiber supplement to your daily regimen. Benefiber powder or chewables OR Fiber Choice chewables are good products. AVOID fiber tablets or capsules. ?Don't wait to start Miralax for management of your constipation. I recommend taking a dose if you go more than 24 hours without a bowel movement. You can take it daily. You may fiber that a daily fiber supplement is all you need!Marland Kitchen   ?Send a mychart message in 3-4 weeks and let me know how you are doing. I will also reach out to Dr. Abbey Chatters to see if he had any additional thoughts regarding your symptoms and colonoscopy findings.  ?If you decide you want to consider hemorrhoid banding in the future, please let me know and we will get you scheduled with Roseanne Kaufman, NP, here in our office. ?Look at your last CT, you have some fatty liver. Your liver blood work is normal. You should have labs once per year to keep a check on it. Fatty liver can progress to scarring of the liver. It is important to limit alcohol use, stay active/exercise regularly, and lose excess weight. See hand out below. ? ?Fatty Liver Disease ?The liver converts food into energy, removes toxic material from the blood, makes important proteins, and absorbs necessary vitamins from food. Fatty liver disease occurs when too much fat has built up in your liver cells. Fatty liver disease is also called hepatic steatosis. ?In many cases, fatty liver disease does not cause symptoms or problems. It is often diagnosed when tests are being done for other reasons. However, over time, fatty liver can cause inflammation that may lead to more serious liver problems, such as scarring of the liver (cirrhosis) and liver failure. ?Fatty liver is associated with insulin resistance, increased body fat, high blood pressure (hypertension), and high cholesterol. These are features of metabolic syndrome and increase your risk for stroke, diabetes, and heart disease. ?What are the causes? ?This condition may be  caused by components of metabolic syndrome: ?Obesity. ?Insulin resistance. ?High cholesterol. ?Other causes: ?Alcohol abuse. ?Poor nutrition. ?Cushing syndrome. ?Pregnancy. ?Certain drugs. ?Poisons. ?Some viral infections. ?What increases the risk? ?You are more likely to develop this condition if you: ?Abuse alcohol. ?Are overweight. ?Have diabetes. ?Have hepatitis. ?Have a high triglyceride level. ?Are pregnant. ?What are the signs or symptoms? ?Fatty liver disease often does not cause symptoms. If symptoms do develop, they can include: ?Fatigue and weakness. ?Weight loss. ?Confusion. ?Nausea, vomiting, or abdominal pain. ?Yellowing of your skin and the white parts of your eyes (jaundice). ?Itchy skin. ?How is this diagnosed? ?This condition may be diagnosed by: ?A physical exam and your medical history. ?Blood tests. ?Imaging tests, such as an ultrasound, CT scan, or MRI. ?A liver biopsy. A small sample of liver tissue is removed using a needle. The sample is then looked at under a microscope. ?How is this treated? ?Fatty liver disease is often caused by other health conditions. Treatment for fatty liver may involve medicines and lifestyle changes to manage conditions such as: ?Alcoholism. ?High cholesterol. ?Diabetes. ?Being overweight or obese. ?Follow these instructions at home: ? ?Do not drink alcohol. If you have trouble quitting, ask your health care provider how to safely quit with the help of medicine or a supervised program. This is important to keep your condition from getting worse. ?Eat a healthy diet as told by your health care provider. Ask your health care provider about working with a dietitian to develop an eating plan. ?Exercise regularly. This can help you lose  weight and control your cholesterol and diabetes. Talk to your health care provider about an exercise plan and which activities are best for you. ?Take over-the-counter and prescription medicines only as told by your health care  provider. ?Keep all follow-up visits. This is important. ?Contact a health care provider if: ?You have trouble controlling your: ?Blood sugar. This is especially important if you have diabetes. ?Cholesterol. ?Drinking of alcohol. ?Get help right away if: ?You have abdominal pain. ?You have jaundice. ?You have nausea and are vomiting. ?You vomit blood or material that looks like coffee grounds. ?You have stools that are black, tar-like, or bloody. ?Summary ?Fatty liver disease develops when too much fat builds up in the cells of your liver. ?Fatty liver disease often causes no symptoms or problems. However, over time, fatty liver can cause inflammation that may lead to more serious liver problems, such as scarring of the liver (cirrhosis). ?You are more likely to develop this condition if you abuse alcohol, are pregnant, are overweight, have diabetes, have hepatitis, or have high triglyceride or cholesterol levels. ?Contact your health care provider if you have trouble controlling your blood sugar, cholesterol, or drinking of alcohol. ?This information is not intended to replace advice given to you by your health care provider. Make sure you discuss any questions you have with your health care provider. ?Document Revised: 07/03/2020 Document Reviewed: 07/03/2020 ?Elsevier Patient Education ? [employer]. ? ?

## 2022-03-15 ENCOUNTER — Telehealth: Payer: Self-pay

## 2022-03-15 NOTE — Telephone Encounter (Signed)
Return the pt's call and LMOVM

## 2022-06-21 ENCOUNTER — Encounter: Payer: Self-pay | Admitting: *Deleted

## 2022-11-23 ENCOUNTER — Ambulatory Visit (INDEPENDENT_AMBULATORY_CARE_PROVIDER_SITE_OTHER): Payer: No Typology Code available for payment source

## 2022-11-23 ENCOUNTER — Ambulatory Visit
Admission: EM | Admit: 2022-11-23 | Discharge: 2022-11-23 | Disposition: A | Discharge status: Home / Self Care | Payer: No Typology Code available for payment source | Attending: Family Medicine | Admitting: Family Medicine

## 2022-11-23 DIAGNOSIS — M79672 Pain in left foot: Secondary | ICD-10-CM

## 2022-11-23 MED ORDER — PREDNISONE 20 MG PO TABS: 40.0000 mg | ORAL_TABLET | Freq: Every day | ORAL | 0 refills | Status: DC

## 2022-11-23 NOTE — ED Provider Notes (Signed)
[facility]    CSN: [id number] Arrival date & time: 11/23/22  1109      History   Chief Complaint Chief Complaint  Patient presents with   Leg Pain    HPI Juan Lopez is a 42 y.o. male.   Patient presenting today with 80-monthhistory of persistent left foot swelling, pain that worsened today when he stepped off on something wrong.  Denies known injury prior to onset of symptoms, numbness, tingling, decreased range of motion, weakness.  States the pain is so severe today since stepping on something wrong that he can barely bear weight on the area.  Trying Tylenol with minimal relief.    Past Medical History:  Diagnosis Date   Acute myopericarditis    a. dx 10/2016   Anxiety    CAD (coronary artery disease)    a. LHC 10/2016 with mild luminal irregularities. Placed on statin    Diverticulitis    Family history of early CAD    HTN (hypertension)     Patient Active Problem List   Diagnosis Date Noted   Alternating constipation and diarrhea 12/21/2021   Nausea and vomiting 04/14/2021   Wound dehiscence, surgical 12/01/2019   Sigmoid diverticulitis 10/01/2019   Diarrhea 08/07/2019   Recurrent acute sigmoid diverticulitis 08/07/2019   Obesity (BMI 30-39.9) 08/07/2019   Acute diverticulitis 08/07/2019   Abdominal pain 07/31/2019   Diverticulitis of colon 08/16/2018   RUQ pain 08/16/2018   Fatty liver 08/16/2018   Change in bowel habits 11/02/2017   Acute myopericarditis 10/16/2016   Essential hypertension 10/16/2016   Family history of early CAD 10/16/2016    Past Surgical History:  Procedure Laterality Date   BIOPSY  06/23/2021   Procedure: BIOPSY;  Surgeon: CEloise Harman DO;  [facility];  Service: Endoscopy;;   CARDIAC CATHETERIZATION [name] 10/16/2016   Procedure: Left Heart Cath and Coronary Angiography;  Surgeon: Peter M JMartinique MD;  [facility];  Service: Cardiovascular;  Laterality: [name];   COLONOSCOPY [name]  11/14/2017   Dr. fOneida Alar 3 mm polyp from proximal ascending colon removed, rectosigmoid colon and sigmoid colon diverticula, external and internal hemorrhoids, random colon biopsies were benign.   COLONOSCOPY WITH PROPOFOL [name] 06/23/2021   Nonbleeding internal hemorrhoids, patent anastomosis characterized by erythema/congestion/erosions with nonspecific inflammatory changes on biopsy.  Next colonoscopy in 5 years due to family history.  Procedure: COLONOSCOPY WITH PROPOFOL;  Surgeon: CEloise Harman DO;  [facility];  Service: Endoscopy;  Laterality: [name];  7:30am   LAPAROSCOPIC PARTIAL COLECTOMY [name] 10/01/2019   Procedure: LAPAROSCOPIC PARTIAL COLECTOMY;  Surgeon: BVirl Cagey MD;  [facility];  Service: General;  Laterality: [name];   POLYPECTOMY  11/14/2017   Procedure: POLYPECTOMY;  Surgeon: FDanie Binder MD;  [facility];  Service: Endoscopy;;  Ascending colon (CS)   TONSILLECTOMY     WISDOM TOOTH EXTRACTION     AGE 60-25       Home Medications    Prior to Admission medications   Medication Sig Start Date End Date Taking? Authorizing Provider  predniSONE (DELTASONE) 20 MG tablet Take 2 tablets (40 mg total) by mouth daily with breakfast. 11/23/22  Yes LVolney American PA-C  acetaminophen (TYLENOL) 325 MG tablet Take 650 mg by mouth every 6 (six) hours as needed for moderate pain or headache.     [provider]  FLUoxetine (PROZAC) 20 MG capsule Take 20 mg by mouth every evening.  08/16/19   [provider]  ibuprofen (ADVIL) 600 MG tablet Take 1 tablet (600 mg total) by mouth every 6 (six) hours as needed. 08/22/21   Sherrell Puller, PA-C    Family History Family History  Problem Relation Age of Onset   Diabetes Mother    Heart failure Mother    Colon polyps Mother        REQUIRED COLECTOMY   Heart failure Father    Heart attack Maternal Grandmother    Pneumonia Maternal Grandfather    Diabetes Paternal Grandmother     Obesity Paternal Grandmother    Heart attack Paternal Grandfather    Colon cancer Neg Hx     Social History Social History   Tobacco Use   Smoking status: Some Days    Packs/day: 0.50    Years: 18.00    Total pack years: 9.00    Types: Cigarettes   Smokeless tobacco: Never   Tobacco comments:    Only smokes when at work; doesn't smoke at home around children  Vaping Use   Vaping Use: Never used  Substance Use Topics   Alcohol use: Yes    Comment: occ   Drug use: No     Allergies   Bee venom   Review of Systems Review of Systems PER HPI  Physical Exam Triage Vital Signs ED Triage Vitals  Enc Vitals Group     BP 11/23/22 1309 (!) 152/109     Pulse Rate 11/23/22 1309 100     Resp 11/23/22 1309 20     Temp 11/23/22 1309 98 F (36.7 C)     Temp Source 11/23/22 1309 Oral     SpO2 11/23/22 1309 96 %     Weight --      Height --      Head Circumference --      Peak Flow --      Pain Score 11/23/22 1310 6     Pain Loc --      Pain Edu? --      Excl. in [place]? --    No data found.  Updated Vital Signs BP (!) 152/109 (BP Location: Right Arm)   Pulse 100   Temp 98 F (36.7 C) (Oral)   Resp 20   SpO2 96%   Visual Acuity Right Eye Distance:   Left Eye Distance:   Bilateral Distance:    Right Eye Near:   Left Eye Near:    Bilateral Near:     Physical Exam Vitals and nursing note reviewed.  Constitutional:      Appearance: Normal appearance.  HENT:     Head: Atraumatic.  Eyes:     Extraocular Movements: Extraocular movements intact.     Conjunctiva/sclera: Conjunctivae normal.  Cardiovascular:     Rate and Rhythm: Normal rate and regular rhythm.  Pulmonary:     Effort: Pulmonary effort is normal.     Breath sounds: Normal breath sounds.  Musculoskeletal:        General: Tenderness present. No swelling. Normal range of motion.     Cervical back: Normal range of motion and neck supple.     Comments: Tender to palpation dorsal midfoot with no  bony deformities palpable  Skin:    General: Skin is warm and dry.     Findings: No bruising or erythema.  Neurological:     General: No focal deficit present.     Mental Status: He is oriented to person, place, and time.  Comments: Left foot neurovascularly intact  Psychiatric:        Mood and Affect: Mood normal.        Thought Content: Thought content normal.        Judgment: Judgment normal.      UC Treatments / Results  Labs (all labs ordered are listed, but only abnormal results are displayed) Labs Reviewed - No data to display  EKG   Radiology DG Foot Complete Left  Result Date: 11/23/2022 CLINICAL DATA:  Pain and swelling EXAM: LEFT FOOT - COMPLETE 3 VIEW COMPARISON:  None Available. FINDINGS: There is no evidence of fracture or dislocation. There is no evidence of arthropathy or other focal bone abnormality. Soft tissues are unremarkable. Posterior calcaneal spur noted. IMPRESSION: No acute osseous abnormalities. Electronically Signed   By: Sammie Bench M.D.   On: 11/23/2022 13:53    Procedures Procedures (including critical care time)  Medications Ordered in UC Medications - No data to display  Initial Impression / Assessment and Plan / UC Course  I have reviewed the triage vital signs and the nursing notes.  Pertinent labs & imaging results that were available during my care of the patient were reviewed by me and considered in my medical decision making (see chart for details).     X-ray today negative for any acute bony abnormalities.  Will treat for a possible inflammatory cause with prednisone, warm Epsom salt soaks, elevation, supportive shoes.  Follow-up with podiatry if not resolving.  Final Clinical Impressions(s) / UC Diagnoses   Final diagnoses:  Left foot pain     Discharge Instructions      Try warm epsom salt soaks, leg elevation, rolling the bottom of the foot on frozen water bottles, wearing supportive shoes. If no improvement or  if symptoms return, call [facility] for further evaluation    ED Prescriptions     Medication Sig Dispense Auth. Provider   predniSONE (DELTASONE) 20 MG tablet Take 2 tablets (40 mg total) by mouth daily with breakfast. 10 tablet Volney American, [place]      PDMP not reviewed this encounter.   Volney American, [place] 11/23/22 [phone]

## 2022-11-23 NOTE — Discharge Instructions (Signed)
Try warm epsom salt soaks, leg elevation, rolling the bottom of the foot on frozen water bottles, wearing supportive shoes. If no improvement or if symptoms return, call [facility] for further evaluation

## 2022-11-23 NOTE — ED Triage Notes (Signed)
Pt reports he has been having left foot swelling and pain x 2 months. Takes tylenol to relieve pain but no relief.    Reports no foot injury

## 2023-04-14 IMAGING — CT CT ABD-PELV W/ CM
2 of 4 series · 15 of 46 positions shown, 17 images · IV contrast (Omnipaque or Isovue)
Comparison: CT Abdomen and Pelvis 08/07/2019.

CLINICAL DATA: 39-year-old male with abdominal pain for 4 days.
History of diverticulitis.

EXAM:
CT ABDOMEN AND PELVIS WITH CONTRAST
TECHNIQUE: Multidetector CT imaging of the abdomen and pelvis was performed
using the standard protocol following bolus administration of
intravenous contrast.
CONTRAST:  100mL OMNIPAQUE IOHEXOL 300 MG/ML  SOLN

[Series 2: axial st · axial · 0.98mm/px · z∈[-800,-265]mm · 12 of 117 slices shown, 14 images]
[im 5/117  soft-tissue]
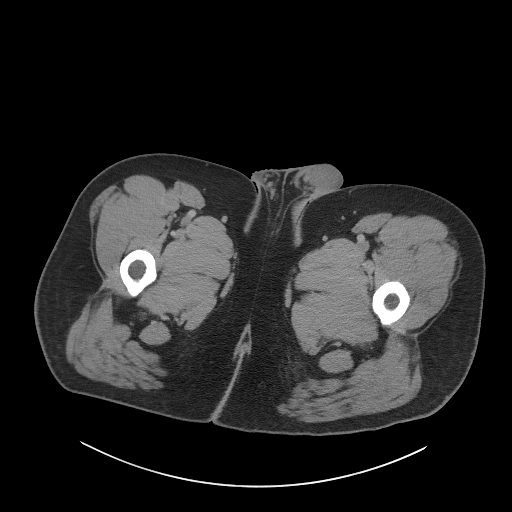
[im 5/117  bone]
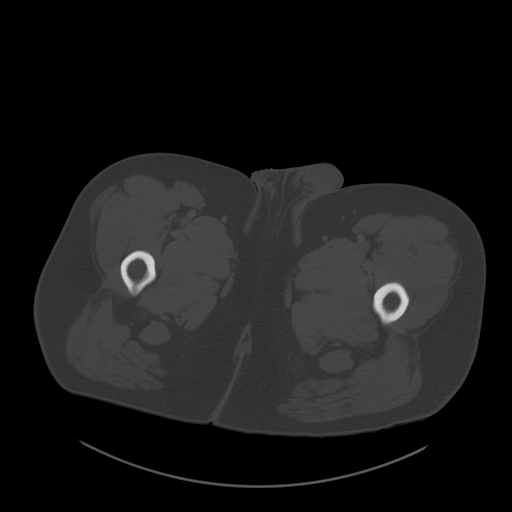
[im 15/117  soft-tissue]
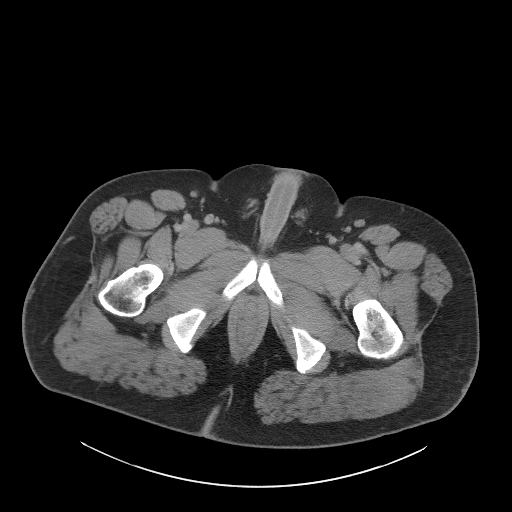
[im 25/117  soft-tissue]
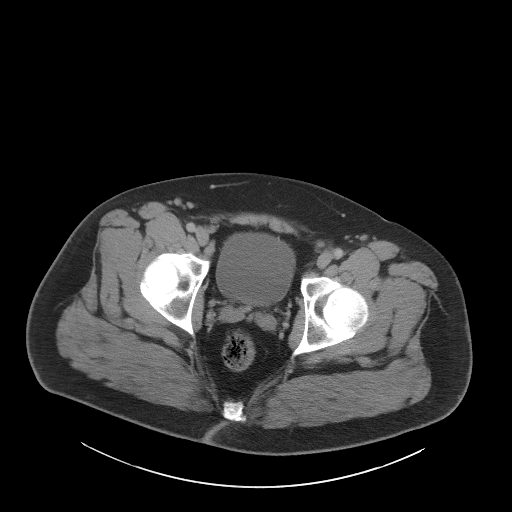
[im 34/117  soft-tissue]
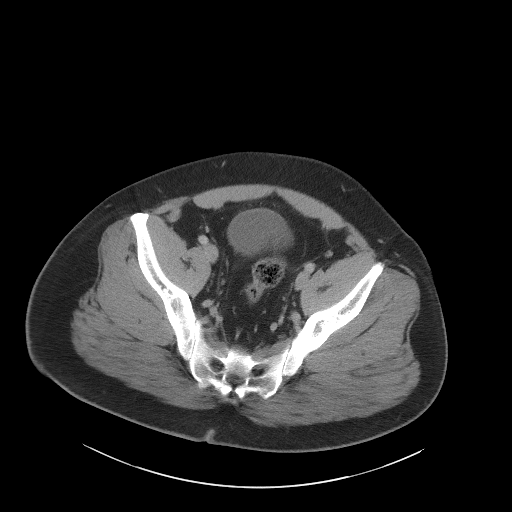
[im 44/117  soft-tissue]
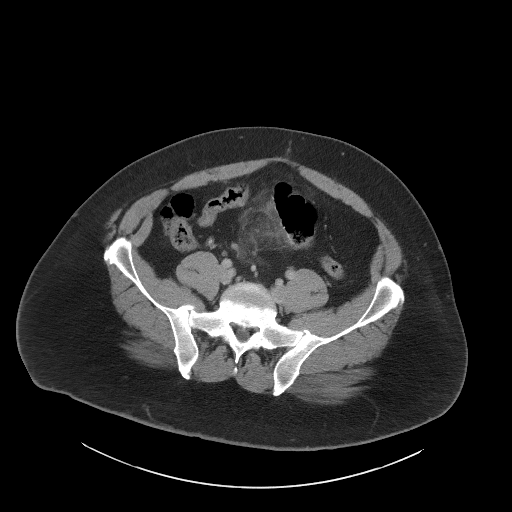
[im 54/117  soft-tissue]
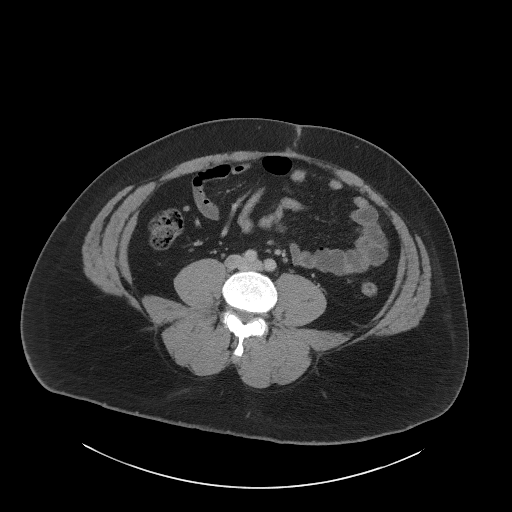
[im 63/117  soft-tissue]
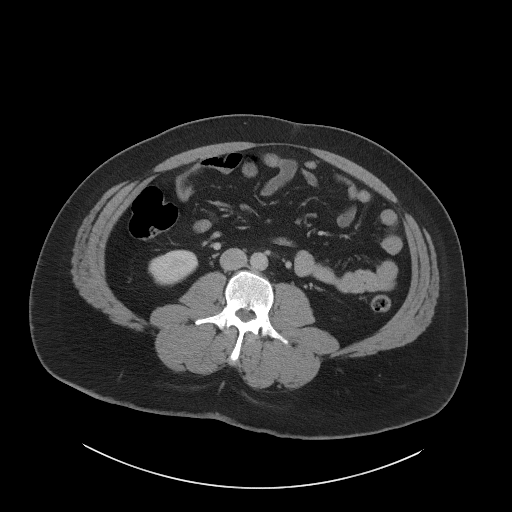
[im 73/117  soft-tissue]
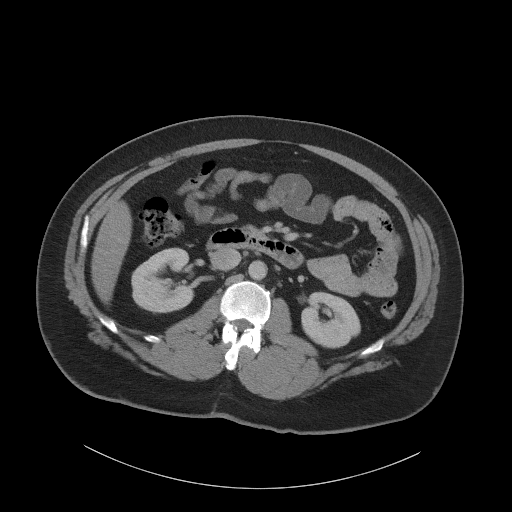
[im 83/117  soft-tissue]
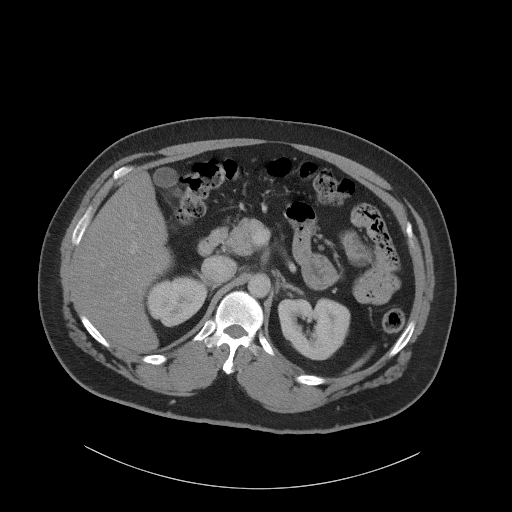
[im 83/117  bone]
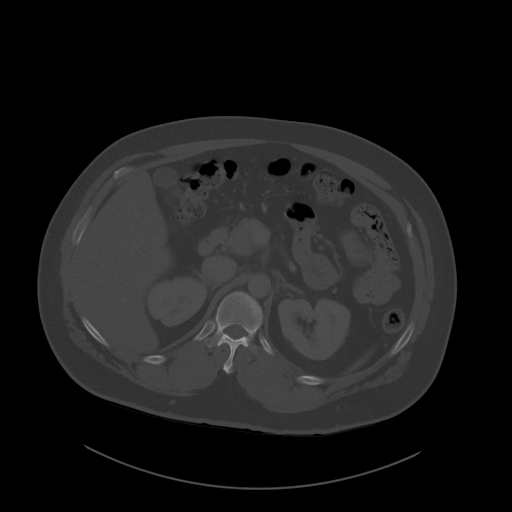
[im 92/117  soft-tissue]
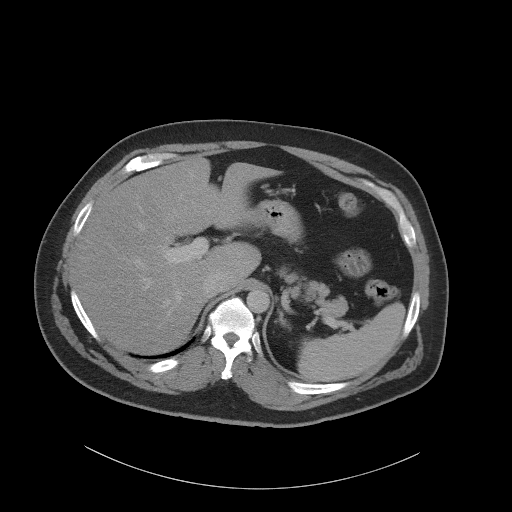
[im 102/117  soft-tissue]
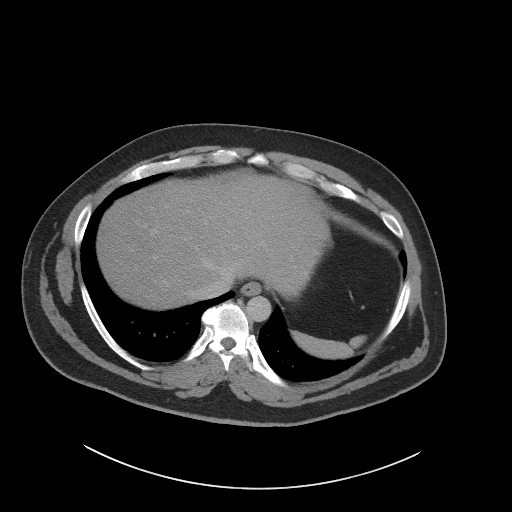
[im 112/117  soft-tissue]
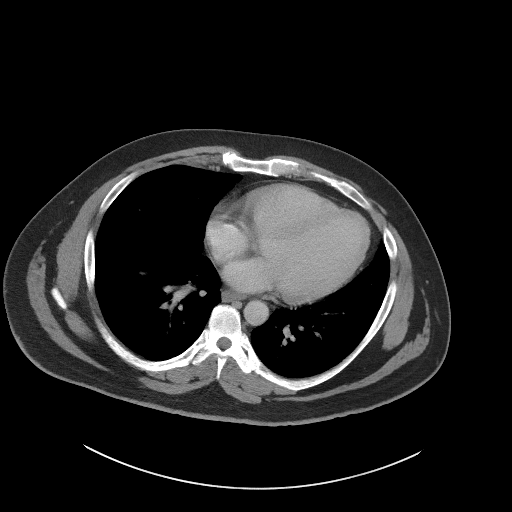

[Series 5: coronal st · coronal · 0.89mm/px · 3 of 117 slices shown]
[im 39/117  soft-tissue]
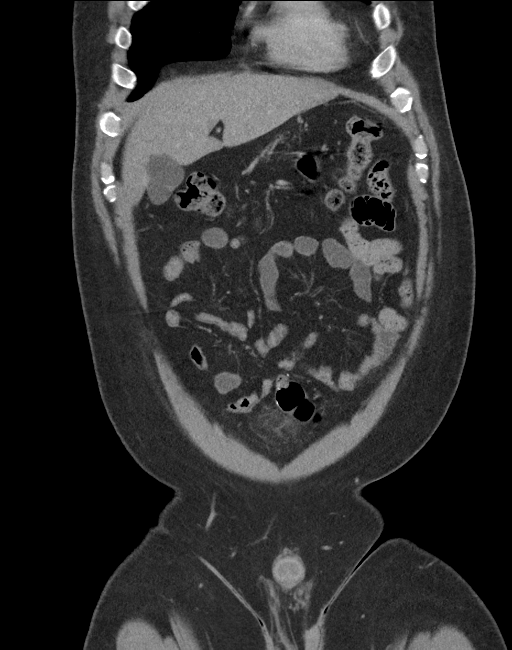
[im 52/117  soft-tissue]
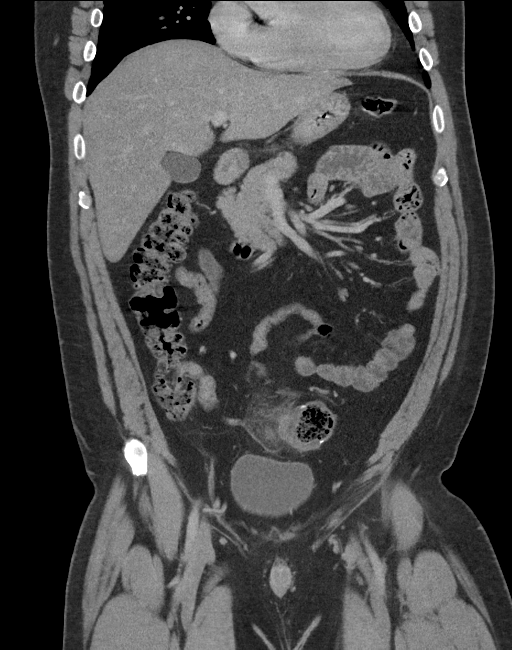
[im 65/117  soft-tissue]
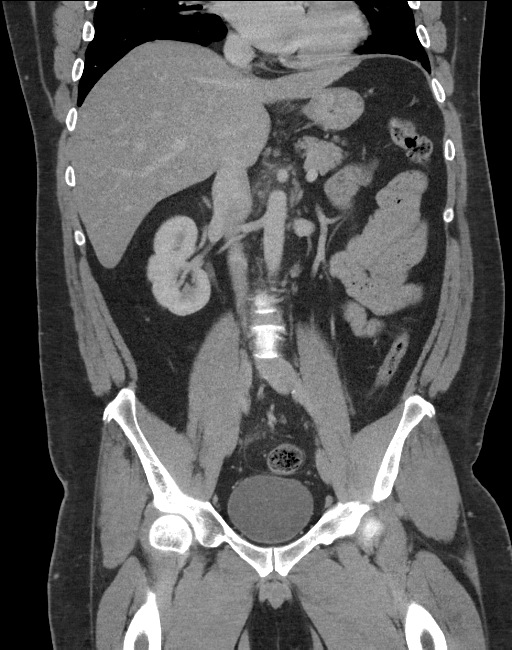

[15 of 46 positions shown; findings below may reference images not displayed]

FINDINGS: Lower chest: Stable mild atelectasis or scarring at the lung bases.
Cardiac size at the upper limits of normal. No pericardial or
pleural effusion.

Hepatobiliary: Hepatic steatosis.  Negative gallbladder.

Pancreas: Negative.

Spleen: Negative.

Adrenals/Urinary Tract: Normal adrenal glands. Nonobstructed kidneys
with symmetric renal enhancement. No nephrolithiasis. Negative
ureters and bladder.

Stomach/Bowel: Negative rectum.

Confluent 8 cm area of bowel and mesenteric inflammation about the
mid sigmoid colon in an area of anastomosis (coronal image 49 and
series 2, image 77) however, the inflammation epicenter appears to
be a residual diverticula there. Focal sigmoid wall thickening up to
12 mm. No extraluminal gas. No discrete fluid.

The upstream descending colon is decompressed and normal. Negative
transverse and right colon, with appendix tracking adjacent to the
mesenteric inflammation, but remains normal (series 2, image 76).
Negative terminal ileum.

No secondary involvement of distal small bowel by the lower
mesenteric inflammation. No dilated small bowel. Decompressed
stomach and duodenum. No free air. No free fluid.

Vascular/Lymphatic: Suboptimal intravascular contrast bolus but the
major arterial structures appear patent. Mild Aortoiliac calcified
atherosclerosis. Portal venous system is patent.

Reproductive: Negative.

Other: No pelvic free fluid.

Musculoskeletal: Stable, negative.
IMPRESSION: 1. Confluent inflammation in the mid sigmoid colon at the site of a
prior sigmoid anastomosis, but this appears to be Acute
Diverticulitis at a residual diverticulum. No perforation, abscess,
or complicating features.

2. No other acute or inflammatory process identified in the abdomen
or pelvis.
Hepatic steatosis.
Mild aortic atherosclerosis.

## 2023-08-18 ENCOUNTER — Ambulatory Visit: Payer: Self-pay

## 2023-08-18 ENCOUNTER — Other Ambulatory Visit: Payer: Self-pay | Admitting: Family Medicine

## 2023-08-18 DIAGNOSIS — Z021 Encounter for pre-employment examination: Secondary | ICD-10-CM

## 2023-08-31 ENCOUNTER — Ambulatory Visit
Admission: RE | Admit: 2023-08-31 | Discharge: 2023-08-31 | Disposition: A | Discharge status: Home / Self Care | Payer: No Typology Code available for payment source | Source: Ambulatory Visit | Attending: Nurse Practitioner | Admitting: Nurse Practitioner

## 2023-08-31 ENCOUNTER — Other Ambulatory Visit: Payer: Self-pay

## 2023-08-31 VITALS — BP 146/93 | HR 78 | Temp 98.1°F | Resp 20

## 2023-08-31 DIAGNOSIS — R21 Rash and other nonspecific skin eruption: Secondary | ICD-10-CM

## 2023-08-31 MED ORDER — DEXAMETHASONE SODIUM PHOSPHATE 10 MG/ML IJ SOLN
10.0000 mg | INTRAMUSCULAR | Status: AC
  Administered 2023-08-31: 10 mg via INTRAMUSCULAR

## 2023-08-31 MED ORDER — PREDNISONE 20 MG PO TABS: 40.0000 mg | ORAL_TABLET | Freq: Every day | ORAL | 0 refills | Status: AC

## 2023-08-31 MED ORDER — TRIAMCINOLONE ACETONIDE 0.1 % EX CREA: 1.0000 | TOPICAL_CREAM | Freq: Two times a day (BID) | CUTANEOUS | 0 refills | Status: AC

## 2023-08-31 NOTE — ED Provider Notes (Signed)
[facility]    CSN: [id number] Arrival date & time: 08/31/23  1144      History   Chief Complaint Chief Complaint  Patient presents with   Poison Ivy    Entered by patient    HPI Juan Lopez is a 42 y.o. male.   The history is provided by the patient.   Patient presents for complaints of rash after contact with poison ivy.  Patient states he has a rash on his hands, on his face, and in his genital area.  Patient states the rash is "itchy."  He denies fever, chills, exposure to new soaps, medications, lotions, foods, detergents, or body washes.  Patient reports he has been using over-the-counter creams such as hydrocortisone cream with minimal relief of his symptoms.  Past Medical History:  Diagnosis Date   Acute myopericarditis    a. dx 10/2016   Anxiety    CAD (coronary artery disease)    a. LHC 10/2016 with mild luminal irregularities. Placed on statin    Diverticulitis    Family history of early CAD    HTN (hypertension)     Patient Active Problem List   Diagnosis Date Noted   Alternating constipation and diarrhea 12/21/2021   Nausea and vomiting 04/14/2021   Wound dehiscence, surgical 12/01/2019   Sigmoid diverticulitis 10/01/2019   Diarrhea 08/07/2019   Recurrent acute sigmoid diverticulitis 08/07/2019   Obesity (BMI 30-39.9) 08/07/2019   Acute diverticulitis 08/07/2019   Abdominal pain 07/31/2019   Diverticulitis of colon 08/16/2018   RUQ pain 08/16/2018   Fatty liver 08/16/2018   Change in bowel habits 11/02/2017   Acute myopericarditis 10/16/2016   Essential hypertension 10/16/2016   Family history of early CAD 10/16/2016    Past Surgical History:  Procedure Laterality Date   BIOPSY  06/23/2021   Procedure: BIOPSY;  Surgeon: Lanelle Bal, DO;  Location: [facility];  Service: Endoscopy;;   CARDIAC CATHETERIZATION [name] 10/16/2016   Procedure: Left Heart Cath and Coronary Angiography;  Surgeon: Peter M Sw[place]il[place], MD;  Location: [facility]  INVASIVE CV LAB;  Service: Cardiovascular;  Laterality: [name];   COLONOSCOPY [name] 11/14/2017   Dr. Darrick Penna: 3 mm polyp from proximal ascending colon removed, rectosigmoid colon and sigmoid colon diverticula, external and internal hemorrhoids, random colon biopsies were benign.   COLONOSCOPY WITH PROPOFOL [name] 06/23/2021   Nonbleeding internal hemorrhoids, patent anastomosis characterized by erythema/congestion/erosions with nonspecific inflammatory changes on biopsy.  Next colonoscopy in 5 years due to family history.  Procedure: COLONOSCOPY WITH PROPOFOL;  Surgeon: Lanelle Bal, DO;  Location: [facility];  Service: Endoscopy;  Laterality: [name];  7:30am   LAPAROSCOPIC PARTIAL COLECTOMY [name] 10/01/2019   Procedure: LAPAROSCOPIC PARTIAL COLECTOMY;  Surgeon: Lucretia Roers, MD;  Location: [facility];  Service: General;  Laterality: [name];   POLYPECTOMY  11/14/2017   Procedure: POLYPECTOMY;  Surgeon: West Bali, MD;  Location: [facility];  Service: Endoscopy;;  Ascending colon (CS)   TONSILLECTOMY     WISDOM TOOTH EXTRACTION     AGE 8-25       Home Medications    Prior to Admission medications   Medication Sig Start Date End Date Taking? Authorizing Provider  predniSONE (DELTASONE) 20 MG tablet Take 2 tablets (40 mg total) by mouth daily with breakfast for 5 days. 08/31/23 09/05/23 Yes Leath-Warren, Sadie Haber, NP  triamcinolone cream (KENALOG) 0.1 % Apply 1 Application topically 2 (two) times daily. 08/31/23  Yes Leath-Warren, Sadie Haber, NP  acetaminophen (TYLENOL) 325 MG tablet Take 650 mg by mouth every 6 (six) hours as needed for moderate pain or headache.     [provider]  FLUoxetine (PROZAC) 20 MG capsule Take 20 mg by mouth every evening.  08/16/19   [provider]  ibuprofen (ADVIL) 600 MG tablet Take 1 tablet (600 mg total) by mouth every 6 (six) hours as needed. 08/22/21   Achille Rich, PA-C    Family History Family History  Problem Relation Age of  Onset   Diabetes Mother    Heart failure Mother    Colon polyps Mother        REQUIRED COLECTOMY   Heart failure Father    Heart attack Maternal Grandmother    Pneumonia Maternal Grandfather    Diabetes Paternal Grandmother    Obesity Paternal Grandmother    Heart attack Paternal Grandfather    Colon cancer Neg Hx     Social History Social History   Tobacco Use   Smoking status: Some Days    Current packs/day: 0.50    Average packs/day: 0.5 packs/day for 18.0 years (9.0 ttl pk-yrs)    Types: Cigarettes   Smokeless tobacco: Never   Tobacco comments:    Only smokes when at work; doesn't smoke at home around children  Vaping Use   Vaping status: Never Used  Substance Use Topics   Alcohol use: Yes    Comment: occ   Drug use: No     Allergies   Bee venom   Review of Systems Review of Systems Per HPI  Physical Exam Triage Vital Signs ED Triage Vitals  Encounter Vitals Group     BP 08/31/23 1227 (!) 146/93     Systolic BP Percentile --      Diastolic BP Percentile --      Pulse Rate 08/31/23 1227 78     Resp 08/31/23 1227 20     Temp 08/31/23 1227 98.1 F (36.7 C)     Temp Source 08/31/23 1227 Oral     SpO2 08/31/23 1227 97 %     Weight --      Height --      Head Circumference --      Peak Flow --      Pain Score 08/31/23 1229 0     Pain Loc --      Pain Education --      Exclude from Growth Chart --    No data found.  Updated Vital Signs BP (!) 146/93 (BP Location: Right Arm)   Pulse 78   Temp 98.1 F (36.7 C) (Oral)   Resp 20   SpO2 97%   Visual Acuity Right Eye Distance:   Left Eye Distance:   Bilateral Distance:    Right Eye Near:   Left Eye Near:    Bilateral Near:     Physical Exam Vitals and nursing note reviewed.  Constitutional:      General: He is not in acute distress.    Appearance: Normal appearance.  HENT:     Head: Normocephalic.  Eyes:     Extraocular Movements: Extraocular movements intact.     Pupils: Pupils are  equal, round, and reactive to light.  Musculoskeletal:     Cervical back: Normal range of motion.  Skin:    General: Skin is warm and dry.     Findings: Rash present. Rash is macular and papular.     Comments: Maculopapular rash noted to the patient's hands and  face.  There is no oozing, fluctuance, or drainage present.  Neurological:     General: No focal deficit present.     Mental Status: He is alert and oriented to person, place, and time.  Psychiatric:        Mood and Affect: Mood normal.        Behavior: Behavior normal.      UC Treatments / Results  Labs (all labs ordered are listed, but only abnormal results are displayed) Labs Reviewed - No data to display  EKG   Radiology No results found.  Procedures Procedures (including critical care time)  Medications Ordered in UC Medications  dexamethasone (DECADRON) injection 10 mg (has no administration in time range)    Initial Impression / Assessment and Plan / UC Course  I have reviewed the triage vital signs and the nursing notes.  Pertinent labs & imaging results that were available during my care of the patient were reviewed by me and considered in my medical decision making (see chart for details).  Will treat patient for poison ivy with Decadron 10 mg IM.  Will start patient on prednisone 40 mg for the next 5 days along with triamcinolone cream 0.1% to apply to the affected areas.  Supportive care recommendations were provided and discussed with the patient to include over-the-counter antihistamines, use of Aveeno colloidal bath, and avoidance of hot baths or showers.  Patient advised to follow-up in this clinic or with his PCP if symptoms do not improve with this treatment.  Patient is in agreement with this plan of care and verbalizes understanding.  All questions were answered.  Patient stable for discharge.  Final Clinical Impressions(s) / UC Diagnoses   Final diagnoses:  Rash and nonspecific skin eruption      Discharge Instructions      You have been given an injection of Decadron 10 mg today. Take medication as prescribed. May also take over-the-counter Zyrtec during the daytime or Benadryl at bedtime to help with itching. Avoid hot baths or showers while symptoms persist.  Recommend taking lukewarm baths. May apply cool cloths to the area to help with itching or discomfort. Avoid scratching, rubbing, or manipulating the areas while symptoms persist. Recommend Aveeno colloidal oatmeal bath to use to help with drying and itching. You may follow-up in this clinic or with your primary care physician if symptoms do not improve with this treatment. Follow-up as needed.     ED Prescriptions     Medication Sig Dispense Auth. Provider   predniSONE (DELTASONE) 20 MG tablet Take 2 tablets (40 mg total) by mouth daily with breakfast for 5 days. 10 tablet Leath-Warren, Sadie Haber, NP   triamcinolone cream (KENALOG) 0.1 % Apply 1 Application topically 2 (two) times daily. 45 g Leath-Warren, Sadie Haber, NP      PDMP not reviewed this encounter.   Abran Cantor, NP 08/31/23 1250

## 2023-08-31 NOTE — Discharge Instructions (Addendum)
You have been given an injection of Decadron 10 mg today. Take medication as prescribed. May also take over-the-counter Zyrtec during the daytime or Benadryl at bedtime to help with itching. Avoid hot baths or showers while symptoms persist.  Recommend taking lukewarm baths. May apply cool cloths to the area to help with itching or discomfort. Avoid scratching, rubbing, or manipulating the areas while symptoms persist. Recommend Aveeno colloidal oatmeal bath to use to help with drying and itching. You may follow-up in this clinic or with your primary care physician if symptoms do not improve with this treatment. Follow-up as needed.

## 2023-08-31 NOTE — ED Triage Notes (Signed)
Pt reports was cutting a tree with poison ivy on it and reports generalized itching sensation, reports redness/rash like areas started in hands and is now in "private areas" and lower back. Reports has tried otc creams and ointment with no change in itching. Pt reports eyes started swelling last night.

## 2023-12-24 ENCOUNTER — Emergency Department (HOSPITAL_COMMUNITY)
Admission: EM | Admit: 2023-12-24 | Discharge: 2023-12-25 | Disposition: A | Discharge status: Home / Self Care | Attending: Student | Admitting: Student

## 2023-12-24 ENCOUNTER — Emergency Department (HOSPITAL_COMMUNITY)

## 2023-12-24 ENCOUNTER — Other Ambulatory Visit: Payer: Self-pay

## 2023-12-24 DIAGNOSIS — I10 Essential (primary) hypertension: Secondary | ICD-10-CM | POA: Diagnosis not present

## 2023-12-24 DIAGNOSIS — I2510 Atherosclerotic heart disease of native coronary artery without angina pectoris: Secondary | ICD-10-CM | POA: Insufficient documentation

## 2023-12-24 DIAGNOSIS — F17210 Nicotine dependence, cigarettes, uncomplicated: Secondary | ICD-10-CM | POA: Diagnosis not present

## 2023-12-24 DIAGNOSIS — R0602 Shortness of breath: Secondary | ICD-10-CM | POA: Insufficient documentation

## 2023-12-24 DIAGNOSIS — R0789 Other chest pain: Secondary | ICD-10-CM | POA: Diagnosis present

## 2023-12-24 LAB — COMPREHENSIVE METABOLIC PANEL
ALT: 24 U/L (ref 0–44)
AST: 19 U/L (ref 15–41)
Albumin: 4 g/dL (ref 3.5–5.0)
Alkaline Phosphatase: 76 U/L (ref 38–126)
Anion gap: 11 (ref 5–15)
BUN: 11 mg/dL (ref 6–20)
CO2: 24 mmol/L (ref 22–32)
Calcium: 8.9 mg/dL (ref 8.9–10.3)
Chloride: 100 mmol/L (ref 98–111)
Creatinine, Ser: 0.83 mg/dL (ref 0.61–1.24)
GFR, Estimated: >60 mL/min (ref >60)
Glucose, Bld: 84 mg/dL (ref 70–99)
Potassium: 3.9 mmol/L (ref 3.5–5.1)
Sodium: 135 mmol/L (ref 135–145)
Total Bilirubin: 0.6 mg/dL (ref 0.0–1.2)
Total Protein: 7 g/dL (ref 6.5–8.1)

## 2023-12-24 LAB — CBC
HCT: 50.3 % (ref 39.0–52.0)
Hemoglobin: 16.8 g/dL (ref 13.0–17.0)
MCH: 31.6 pg (ref 26.0–34.0)
MCHC: 33.4 g/dL (ref 30.0–36.0)
MCV: 94.5 fL (ref 80.0–100.0)
Platelets: 273 10*3/uL (ref 150–400)
RBC: 5.32 MIL/uL (ref 4.22–5.81)
RDW: 13.3 % (ref 11.5–15.5)
WBC: 10.5 10*3/uL (ref 4.0–10.5)
nRBC: 0 % (ref 0.0–0.2)

## 2023-12-24 LAB — TROPONIN I (HIGH SENSITIVITY)
Troponin I (High Sensitivity): 4 ng/L (ref <18)
Troponin I (High Sensitivity): 4 ng/L (ref <18)

## 2023-12-24 MED ORDER — KETOROLAC TROMETHAMINE 15 MG/ML IJ SOLN
15.0000 mg | Freq: Once | INTRAMUSCULAR | Status: AC
  Administered 2023-12-24: 15 mg via INTRAMUSCULAR
  Filled 2023-12-24: qty 1

## 2023-12-24 MED ORDER — NAPROXEN 375 MG PO TABS: 375.0000 mg | ORAL_TABLET | Freq: Two times a day (BID) | ORAL | 0 refills | Status: AC

## 2023-12-24 MED ORDER — ACETAMINOPHEN 500 MG PO TABS
1000.0000 mg | ORAL_TABLET | Freq: Once | ORAL | Status: AC
  Administered 2023-12-24: 1000 mg via ORAL
  Filled 2023-12-24: qty 2

## 2023-12-24 MED ORDER — LIDOCAINE VISCOUS HCL 2 % MT SOLN
15.0000 mL | Freq: Once | OROMUCOSAL | Status: AC
  Administered 2023-12-24: 15 mL via ORAL
  Filled 2023-12-24: qty 15

## 2023-12-24 MED ORDER — ALUM & MAG HYDROXIDE-SIMETH 200-200-20 MG/5ML PO SUSP
30.0000 mL | Freq: Once | ORAL | Status: AC
  Administered 2023-12-24: 30 mL via ORAL
  Filled 2023-12-24: qty 30

## 2023-12-24 MED ORDER — LIDOCAINE 5 % EX PTCH
1.0000 | MEDICATED_PATCH | CUTANEOUS | Status: DC
  Administered 2023-12-24: 1 via TRANSDERMAL
  Filled 2023-12-24: qty 1

## 2023-12-24 NOTE — ED Triage Notes (Signed)
 Pt states around 1300 today, while at rest, onset of left sided chest pain, now the pain is going down his left arm. He c/o it feels hard to take a deep breath.

## 2023-12-24 NOTE — ED Provider Notes (Signed)
 Patient signed out pending delta trop.  Repeat troponin negative.  Patient and wife updated.  Will provide cardiology follow-up.   Shon Baton, MD 12/25/23 [phone]

## 2023-12-24 NOTE — Discharge Instructions (Addendum)
 You were seen today for chest pain.  Your workup today is reassuring.  Follow-up with your primary doctor and cardiology.

## 2023-12-25 NOTE — ED Provider Notes (Signed)
 [place] EMERGENCY DEPARTMENT AT [facility] Provider Note  CSN: [id number] Arrival date & time: 12/24/23 2039  Chief Complaint(s) Chest Pain  HPI Juan Lopez is a 43 y.o. male with PMH acute myopericarditis, CAD, HTN who presents emerged part for evaluation of chest pain.  States that pain began abruptly at 1300 today with a sharp stabbing left-sided chest pain shooting down his left arm.  States that this feels similar to his previous episode of myopericarditis.  Denies associated nausea, vomiting, diaphoresis or any exertional component to the pain.  Endorses mild shortness of breath.  Denies headache, fever, diarrhea or other systemic symptoms.  No trauma to the chest wall.   Past Medical History Past Medical History:  Diagnosis Date   Acute myopericarditis    a. dx 10/2016   Anxiety    CAD (coronary artery disease)    a. LHC 10/2016 with mild luminal irregularities. Placed on statin    Diverticulitis    Family history of early CAD    HTN (hypertension)    Patient Active Problem List   Diagnosis Date Noted   Alternating constipation and diarrhea 12/21/2021   Nausea and vomiting 04/14/2021   Wound dehiscence, surgical 12/01/2019   Sigmoid diverticulitis 10/01/2019   Diarrhea 08/07/2019   Recurrent acute sigmoid diverticulitis 08/07/2019   Obesity (BMI 30-39.9) 08/07/2019   Acute diverticulitis 08/07/2019   Abdominal pain 07/31/2019   Diverticulitis of colon 08/16/2018   RUQ pain 08/16/2018   Fatty liver 08/16/2018   Change in bowel habits 11/02/2017   Acute myopericarditis 10/16/2016   Essential hypertension 10/16/2016   Family history of early CAD 10/16/2016   Home Medication(s) Prior to Admission medications   Medication Sig Start Date End Date Taking? Authorizing Provider  naproxen (NAPROSYN) 375 MG tablet Take 1 tablet (375 mg total) by mouth 2 (two) times daily. 12/24/23  Yes Kayani Rapaport, MD  acetaminophen (TYLENOL) 325 MG tablet Take 650 mg by  mouth every 6 (six) hours as needed for moderate pain or headache.     [provider]  FLUoxetine (PROZAC) 20 MG capsule Take 20 mg by mouth every evening.  08/16/19   [provider]  ibuprofen (ADVIL) 600 MG tablet Take 1 tablet (600 mg total) by mouth every 6 (six) hours as needed. 08/22/21   Achille Rich, PA-C  triamcinolone cream (KENALOG) 0.1 % Apply 1 Application topically 2 (two) times daily. 08/31/23   Leath-Warren, Sadie Haber, NP                                                                                                                                    Past Surgical History Past Surgical History:  Procedure Laterality Date   BIOPSY  06/23/2021   Procedure: BIOPSY;  Surgeon: Lanelle Bal, DO;  Location: [facility];  Service: Endoscopy;;   CARDIAC CATHETERIZATION [name] 10/16/2016   Procedure: Left Heart Cath and Coronary  Angiography;  Surgeon: Peter M Swaziland, MD;  Location: [facility] INVASIVE CV LAB;  Service: Cardiovascular;  Laterality: [name];   COLONOSCOPY [name] 11/14/2017   Dr. Darrick Penna: 3 mm polyp from proximal ascending colon removed, rectosigmoid colon and sigmoid colon diverticula, external and internal hemorrhoids, random colon biopsies were benign.   COLONOSCOPY WITH PROPOFOL [name] 06/23/2021   Nonbleeding internal hemorrhoids, patent anastomosis characterized by erythema/congestion/erosions with nonspecific inflammatory changes on biopsy.  Next colonoscopy in 5 years due to family history.  Procedure: COLONOSCOPY WITH PROPOFOL;  Surgeon: Lanelle Bal, DO;  Location: [facility];  Service: Endoscopy;  Laterality: [name];  7:30am   LAPAROSCOPIC PARTIAL COLECTOMY [name] 10/01/2019   Procedure: LAPAROSCOPIC PARTIAL COLECTOMY;  Surgeon: Lucretia Roers, MD;  Location: [facility] ORS;  Service: General;  Laterality: [name];   POLYPECTOMY  11/14/2017   Procedure: POLYPECTOMY;  Surgeon: West Bali, MD;  Location: [facility];  Service: Endoscopy;;  Ascending colon (CS)    TONSILLECTOMY     WISDOM TOOTH EXTRACTION     AGE 41-25   Family History Family History  Problem Relation Age of Onset   Diabetes Mother    Heart failure Mother    Colon polyps Mother        REQUIRED COLECTOMY   Heart failure Father    Heart attack Maternal Grandmother    Pneumonia Maternal Grandfather    Diabetes Paternal Grandmother    Obesity Paternal Grandmother    Heart attack Paternal Grandfather    Colon cancer Neg Hx     Social History Social History   Tobacco Use   Smoking status: Some Days    Current packs/day: 0.50    Average packs/day: 0.5 packs/day for 18.0 years (9.0 ttl pk-yrs)    Types: Cigarettes   Smokeless tobacco: Never   Tobacco comments:    Only smokes when at work; doesn't smoke at home around children  Vaping Use   Vaping status: Never Used  Substance Use Topics   Alcohol use: Yes    Comment: occ   Drug use: No   Allergies Bee venom  Review of Systems Review of Systems  Cardiovascular:  Positive for chest pain.    Physical Exam Vital Signs  I have reviewed the triage vital signs BP 128/83   Pulse 84   Temp 98.8 F (37.1 C)   Resp 20   SpO2 96%   Physical Exam Vitals and nursing note reviewed.  Constitutional:      General: He is not in acute distress.    Appearance: He is well-developed.  HENT:     Head: Normocephalic and atraumatic.  Eyes:     Conjunctiva/sclera: Conjunctivae normal.  Cardiovascular:     Rate and Rhythm: Normal rate and regular rhythm.     Heart sounds: No murmur heard. Pulmonary:     Effort: Pulmonary effort is normal. No respiratory distress.     Breath sounds: Normal breath sounds.  Chest:     Chest wall: Tenderness present.  Abdominal:     Palpations: Abdomen is soft.     Tenderness: There is no abdominal tenderness.  Musculoskeletal:        General: No swelling.     Cervical back: Neck supple.  Skin:    General: Skin is warm and dry.     Capillary Refill: Capillary refill takes less than  2 seconds.  Neurological:     Mental Status: He is alert.  Psychiatric:        Mood  and Affect: Mood normal.     ED Results and Treatments Labs (all labs ordered are listed, but only abnormal results are displayed) Labs Reviewed  CBC  COMPREHENSIVE METABOLIC PANEL  TROPONIN I (HIGH SENSITIVITY)  TROPONIN I (HIGH SENSITIVITY)                                                                                                                          Radiology DG Chest 2 View Result Date: 12/24/2023 CLINICAL DATA:  cp EXAM: CHEST - 2 VIEW COMPARISON:  Chest x-ray 08/18/2023 FINDINGS: The heart and mediastinal contours are within normal limits. Low lung volumes. No focal consolidation. No pulmonary edema. No pleural effusion. No pneumothorax. No acute osseous abnormality. IMPRESSION: Low lung volumes with no active cardiopulmonary disease. Electronically Signed   By: Tish Frederickson M.D.   On: 12/24/2023 21:25    Pertinent labs & imaging results that were available during my care of the patient were reviewed by me and considered in my medical decision making (see MDM for details).  Medications Ordered in ED Medications  acetaminophen (TYLENOL) tablet 1,000 mg (1,000 mg Oral Given 12/24/23 2134)  ketorolac (TORADOL) 15 MG/ML injection 15 mg (15 mg Intramuscular Given 12/24/23 2220)  alum & mag hydroxide-simeth (MAALOX/MYLANTA) 200-200-20 MG/5ML suspension 30 mL (30 mLs Oral Given 12/24/23 2220)    And  lidocaine (XYLOCAINE) 2 % viscous mouth solution 15 mL (15 mLs Oral Given 12/24/23 2220)                                                                                                                                     Procedures Procedures  (including critical care time)  Medical Decision Making / ED Course   This patient presents to the ED for concern of chest pain, this involves an extensive number of treatment options, and is a complaint that carries with it a high risk of  complications and morbidity.  The differential diagnosis includes ACS, Aortic Dissection, Pneumothorax, Pneumonia, Esophageal Rupture, PE, Tamponade/Pericardial Effusion, pericarditis, esophageal spasm, dysrhythmia, GERD, costochondritis.  MDM: Patient seen emergency room for evaluation of chest pain.  Physical exam with reproducible tenderness under the breast along the rib line on the left but is otherwise unremarkable.  Laboratory evaluation is unremarkable including negative high-sensitivity troponin.  ECG without evidence of ischemia or diffuse ST elevations.  Chest x-ray is unremarkable.  Symptoms improving with  Toradol and Lidoderm.  Pending delta troponin at time of signout.  Please see provider signout attenuation of workup.  Anticipate discharge if delta troponin is negative.  Patient is PERC negative low risk by Wells criteria and I have low suspicion for PE.  Heart score is less than 3 and I have low suspicion for ACS.   Additional history obtained: -Additional history obtained from wife -External records from outside source obtained and reviewed including: Chart review including previous notes, labs, imaging, consultation notes   Lab Tests: -I ordered, reviewed, and interpreted labs.   The pertinent results include:   Labs Reviewed  CBC  COMPREHENSIVE METABOLIC PANEL  TROPONIN I (HIGH SENSITIVITY)  TROPONIN I (HIGH SENSITIVITY)      EKG   EKG Interpretation Date/Time:  Saturday December 24 2023 20:58:54 EDT Ventricular Rate:  80 PR Interval:  166 QRS Duration:  94 QT Interval:  353 QTC Calculation: 408 R Axis:   100  Text Interpretation: Sinus rhythm Right axis deviation Confirmed by Shaeleigh Graw ([id number]) on 12/24/2023 9:27:18 PM         Imaging Studies ordered: I ordered imaging studies including chest x-ray I independently visualized and interpreted imaging. I agree with the radiologist interpretation   Medicines ordered and prescription drug management: Meds  ordered this encounter  Medications   acetaminophen (TYLENOL) tablet 1,000 mg   ketorolac (TORADOL) 15 MG/ML injection 15 mg   AND Linked Order Group    alum & mag hydroxide-simeth (MAALOX/MYLANTA) 200-200-20 MG/5ML suspension 30 mL    lidocaine (XYLOCAINE) 2 % viscous mouth solution 15 mL   DISCONTD: lidocaine (LIDODERM) 5 % 1 patch   naproxen (NAPROSYN) 375 MG tablet    Sig: Take 1 tablet (375 mg total) by mouth 2 (two) times daily.    Dispense:  20 tablet    Refill:  0    -I have reviewed the patients home medicines and have made adjustments as needed  Critical interventions none   Cardiac Monitoring: The patient was maintained on a cardiac monitor.  I personally viewed and interpreted the cardiac monitored which showed an underlying rhythm of: NSR  Social Determinants of Health:  Factors impacting patients care include: none   Reevaluation: After the interventions noted above, I reevaluated the patient and found that they have :improved  Co morbidities that complicate the patient evaluation  Past Medical History:  Diagnosis Date   Acute myopericarditis    a. dx 10/2016   Anxiety    CAD (coronary artery disease)    a. LHC 10/2016 with mild luminal irregularities. Placed on statin    Diverticulitis    Family history of early CAD    HTN (hypertension)       Dispostion: I considered admission for this patient, and disposition pending repeat troponin.  Please see provider signout continuation of workup.     Final Clinical Impression(s) / ED Diagnoses Final diagnoses:  Atypical chest pain     @PCDICTATION @    Glendora Score, MD 12/25/23 1151

## 2023-12-26 ENCOUNTER — Other Ambulatory Visit (HOSPITAL_COMMUNITY)
Admission: RE | Admit: 2023-12-26 | Discharge: 2023-12-26 | Disposition: A | Discharge status: Home / Self Care | Source: Ambulatory Visit | Attending: Cardiology | Admitting: Cardiology

## 2023-12-26 ENCOUNTER — Encounter: Payer: Self-pay | Admitting: Cardiology

## 2023-12-26 ENCOUNTER — Ambulatory Visit: Attending: Cardiology | Admitting: Cardiology

## 2023-12-26 VITALS — BP 130/92 | HR 84 | Ht 72.0 in | Wt 268.0 lb

## 2023-12-26 DIAGNOSIS — R079 Chest pain, unspecified: Secondary | ICD-10-CM | POA: Diagnosis present

## 2023-12-26 LAB — SEDIMENTATION RATE: Sed Rate: 1 mm/h (ref 0–16)

## 2023-12-26 MED ORDER — IBUPROFEN 200 MG PO TABS: 400.0000 mg | ORAL_TABLET | Freq: Three times a day (TID) | ORAL | Status: AC

## 2023-12-26 NOTE — Progress Notes (Signed)
 Clinical Summary Juan Lopez is a 43 y.o.male last seen in our office in 02/2020, seen today as a new patient for the following medical problems.  1.Chest pain - ER visit 12/2023 with chest pain - from notes pain was reproducible - EKG and trops benign - symptosm improved with toradol and lidoderm  - pain started around 1pm. Started while sitting at work. Mid to left chest, into shoulder and arm. Pressing like feeling, +SOB. 8-9/10 in severity. Not positional. Completed his shift, got 8pm. Pain was constant though varied in severity. After getting home family concerned and went to ER - he denies it being reproducible, pain had eased up at discharge.  - pain off and on yeseterday      Other medical problems not addressed this visit   1. History of myopericarditis - admission Jan 2018 with chest pain. Troponin up to 2.4, EKG suspicious for pericarditis - cath showed no significant CAD - discharged on ibuprofen 400mg  tid x 2 weeks, and colchcine 0.6mg  bid x 3 months.  - echo Jan 2018 LVEF 55-60%, no WMAs, no effusion.    - just occasional nonspecific chest pain.      2. Palpitations - we changed lopressor to dilt due to fatigue.  - no recent palpitations.      3. HTN Home bps 140s-170s/90-111 recently  - no heavy NSAIDs, no significant EtoH.  - he had stopped his bp meds for about 1 month. Back on x 2 weeks.    Past Medical History:  Diagnosis Date   Acute myopericarditis    a. dx 10/2016   Anxiety    CAD (coronary artery disease)    a. LHC 10/2016 with mild luminal irregularities. Placed on statin    Diverticulitis    Family history of early CAD    HTN (hypertension)      Allergies  Allergen Reactions   Bee Venom Nausea And Vomiting     Current Outpatient Medications  Medication Sig Dispense Refill   acetaminophen (TYLENOL) 325 MG tablet Take 650 mg by mouth every 6 (six) hours as needed for moderate pain or headache.      FLUoxetine (PROZAC) 20 MG  capsule Take 20 mg by mouth every evening.      ibuprofen (ADVIL) 600 MG tablet Take 1 tablet (600 mg total) by mouth every 6 (six) hours as needed. 30 tablet 0   naproxen (NAPROSYN) 375 MG tablet Take 1 tablet (375 mg total) by mouth 2 (two) times daily. 20 tablet 0   triamcinolone cream (KENALOG) 0.1 % Apply 1 Application topically 2 (two) times daily. 45 g 0   No current facility-administered medications for this visit.     Past Surgical History:  Procedure Laterality Date   BIOPSY  06/23/2021   Procedure: BIOPSY;  Surgeon: Lanelle Bal, DO;  Location: [facility];  Service: Endoscopy;;   CARDIAC CATHETERIZATION [name] 10/16/2016   Procedure: Left Heart Cath and Coronary Angiography;  Surgeon: Peter M Swaziland, MD;  Location: [facility] INVASIVE CV LAB;  Service: Cardiovascular;  Laterality: [name];   COLONOSCOPY [name] 11/14/2017   Dr. Darrick Penna: 3 mm polyp from proximal ascending colon removed, rectosigmoid colon and sigmoid colon diverticula, external and internal hemorrhoids, random colon biopsies were benign.   COLONOSCOPY WITH PROPOFOL [name] 06/23/2021   Nonbleeding internal hemorrhoids, patent anastomosis characterized by erythema/congestion/erosions with nonspecific inflammatory changes on biopsy.  Next colonoscopy in 5 years due to family history.  Procedure: COLONOSCOPY WITH PROPOFOL;  Surgeon: Lanelle Bal, DO;  Location: [facility];  Service: Endoscopy;  Laterality: [name];  7:30am   LAPAROSCOPIC PARTIAL COLECTOMY [name] 10/01/2019   Procedure: LAPAROSCOPIC PARTIAL COLECTOMY;  Surgeon: Lucretia Roers, MD;  Location: [facility] ORS;  Service: General;  Laterality: [name];   POLYPECTOMY  11/14/2017   Procedure: POLYPECTOMY;  Surgeon: West Bali, MD;  Location: [facility];  Service: Endoscopy;;  Ascending colon (CS)   TONSILLECTOMY     WISDOM TOOTH EXTRACTION     AGE 6-25     Allergies  Allergen Reactions   Bee Venom Nausea And Vomiting      Family History  Problem Relation Age of  Onset   Diabetes Mother    Heart failure Mother    Colon polyps Mother        REQUIRED COLECTOMY   Heart failure Father    Heart attack Maternal Grandmother    Pneumonia Maternal Grandfather    Diabetes Paternal Grandmother    Obesity Paternal Grandmother    Heart attack Paternal Grandfather    Colon cancer Neg Hx      Social History Juan Lopez reports that he has been smoking. He has a 9 pack-year smoking history. He has never used smokeless tobacco. Juan Lopez reports current alcohol use.    Physical Examination Today's Vitals   12/26/23 1104  BP: (!) 130/92  Pulse: 84  SpO2: 96%  Weight: 268 lb (121.6 kg)  Height: 6' (1.829 m)   Body mass index is 36.35 kg/m.  Gen: resting comfortably, no acute distress HEENT: no scleral icterus, pupils equal round and reactive, no palptable cervical adenopathy,  CV: RRR, no m/rg, no jvd Resp: Clear to auscultation bilaterally GI: abdomen is soft, non-tender, non-distended, normal bowel sounds, no hepatosplenomegaly MSK: extremities are warm, no edema.  Skin: warm, no rash Neuro:  no focal deficits Psych: appropriate affect    Assessment and Plan    1.Chest pain - symptoms not consistent with ischemic chest pain - most likely inflammatory process. Prior history of myopericaditis, his EKG and trops were benign during ER visit - will check ESR, CRP - treat with ibuprofen 400mg  tid.   F/u 4 weeks    Antoine Poche, M.D.

## 2023-12-26 NOTE — Patient Instructions (Addendum)
 Medication Instructions:   Ibuprofen 400mg  three times per day x 14 days  Continue all other medications.     Labwork:  none  Testing/Procedures:  ESR, high reactive CRP  Office will contact with results via phone, letter or mychart.     Follow-Up:  4 weeks   Any Other Special Instructions Will Be Listed Below (If Applicable).   If you need a refill on your cardiac medications before your next appointment, please call your pharmacy.

## 2023-12-26 NOTE — Telephone Encounter (Signed)
error 

## 2023-12-27 LAB — HIGH SENSITIVITY CRP: CRP, High Sensitivity: 3.42 mg/L — ABNORMAL HIGH (ref 0.00–3.00)

## 2024-01-12 ENCOUNTER — Encounter: Payer: Self-pay | Admitting: *Deleted

## 2024-01-24 ENCOUNTER — Ambulatory Visit: Attending: Nurse Practitioner | Admitting: Nurse Practitioner

## 2024-01-24 NOTE — Progress Notes (Deleted)
  Cardiology Office Note:  .   Date:  01/24/2024  ID:  Juan Lopez, DOB November 22, 1980, MRN [id number] PCP: Kathyleen Parkins, MD  [facility] Providers Cardiologist:  None { Click to update primary MD,subspecialty MD or APP then REFRESH:1}   History of Present Illness: .   Juan Lopez is a 43 y.o. male with a PMH of chest pain, palpitations, hypertension, and past history of myopericarditis in 2018, who presents today for 4-week follow-up.  Hospital admission in 2018 due to chest pain.  Troponin up to 2.4. EKG was suspicious for pericarditis.  Heart cath in 2018 showed no significant CAD.  Discharged on colchicine  and ibuprofen  temporarily.  Echo at that time revealed normal EF, no effusion, no wall motion abnormalities.   Last seen by Dr. Armida Lander on December 26, 2023.  Patient noted chest pain, felt that symptoms were not consistent with ischemic chest pain.  Was most likely inflammatory in process.  Dr. Amanda Jungling reviewed his most recent ER visit that showed unremarkable tropes and EKG.  CRP elevated at 3.42, ESR normal.  Was started on a course of ibuprofen .  Today he presents for follow-up. He states...   ROS: ***  Studies Reviewed: .        *** Risk Assessment/Calculations:   {Does this patient have ATRIAL FIBRILLATION?:[id number]} No BP recorded.  {Refresh Note OR Click here to enter BP  :1}***       Physical Exam:   VS:  There were no vitals taken for this visit.   Wt Readings from Last 3 Encounters:  12/26/23 268 lb (121.6 kg)  12/21/21 280 lb (127 kg)  08/22/21 253 lb (114.8 kg)    GEN: Well nourished, well developed in no acute distress NECK: No JVD; No carotid bruits CARDIAC: ***RRR, no murmurs, rubs, gallops RESPIRATORY:  Clear to auscultation without rales, wheezing or rhonchi  ABDOMEN: Soft, non-tender, non-distended EXTREMITIES:  No edema; No deformity   ASSESSMENT AND PLAN: .   ***    {Are you ordering a CV Procedure (e.g. stress test, cath,  DCCV, TEE, etc)?   Press F2        :[id number]}  Dispo: ***  Signed, Lasalle Pointer, NP
